# Patient Record
Sex: Female | Born: 1970
Health system: Southern US, Community
[De-identification: ages and names within clinical notes are randomized; demographics above are authoritative.]

## PROBLEM LIST (undated history)

## (undated) DIAGNOSIS — R87619 Unspecified abnormal cytological findings in specimens from cervix uteri: Secondary | ICD-10-CM

## (undated) DIAGNOSIS — F419 Anxiety disorder, unspecified: Secondary | ICD-10-CM

## (undated) DIAGNOSIS — IMO0002 Reserved for concepts with insufficient information to code with codable children: Secondary | ICD-10-CM

## (undated) DIAGNOSIS — F988 Other specified behavioral and emotional disorders with onset usually occurring in childhood and adolescence: Secondary | ICD-10-CM

## (undated) HISTORY — DX: Anxiety disorder, unspecified: F41.9

## (undated) HISTORY — DX: Unspecified abnormal cytological findings in specimens from cervix uteri: R87.619

## (undated) HISTORY — DX: Reserved for concepts with insufficient information to code with codable children: IMO0002

## (undated) HISTORY — DX: Other specified behavioral and emotional disorders with onset usually occurring in childhood and adolescence: F98.8

## (undated) HISTORY — PX: WISDOM TOOTH EXTRACTION: SHX21

---

## 2000-01-09 ENCOUNTER — Other Ambulatory Visit: Admission: RE | Admit: 2000-01-09 | Discharge: 2000-01-09 | Payer: Self-pay | Admitting: Obstetrics and Gynecology

## 2000-12-15 ENCOUNTER — Other Ambulatory Visit: Admission: RE | Admit: 2000-12-15 | Discharge: 2000-12-15 | Payer: Self-pay | Admitting: Obstetrics and Gynecology

## 2001-06-28 ENCOUNTER — Other Ambulatory Visit: Admission: RE | Admit: 2001-06-28 | Discharge: 2001-06-28 | Payer: Self-pay | Admitting: Obstetrics and Gynecology

## 2001-10-06 ENCOUNTER — Ambulatory Visit (HOSPITAL_COMMUNITY): Admission: RE | Admit: 2001-10-06 | Discharge: 2001-10-06 | Payer: Self-pay | Admitting: Obstetrics and Gynecology

## 2001-10-06 ENCOUNTER — Encounter: Payer: Self-pay | Admitting: Obstetrics and Gynecology

## 2001-11-06 ENCOUNTER — Emergency Department (HOSPITAL_COMMUNITY): Admission: EM | Admit: 2001-11-06 | Discharge: 2001-11-06 | Payer: Self-pay

## 2002-01-22 ENCOUNTER — Inpatient Hospital Stay (HOSPITAL_COMMUNITY): Admission: AD | Admit: 2002-01-22 | Discharge: 2002-01-25 | Payer: Self-pay | Admitting: Obstetrics and Gynecology

## 2002-01-22 ENCOUNTER — Inpatient Hospital Stay (HOSPITAL_COMMUNITY): Admission: AD | Admit: 2002-01-22 | Discharge: 2002-01-22 | Payer: Self-pay | Admitting: Obstetrics and Gynecology

## 2002-08-11 ENCOUNTER — Other Ambulatory Visit: Admission: RE | Admit: 2002-08-11 | Discharge: 2002-08-11 | Payer: Self-pay | Admitting: Obstetrics and Gynecology

## 2003-08-17 ENCOUNTER — Other Ambulatory Visit: Admission: RE | Admit: 2003-08-17 | Discharge: 2003-08-17 | Payer: Self-pay | Admitting: Obstetrics and Gynecology

## 2004-08-27 ENCOUNTER — Other Ambulatory Visit: Admission: RE | Admit: 2004-08-27 | Discharge: 2004-08-27 | Payer: Self-pay | Admitting: Obstetrics and Gynecology

## 2004-08-29 ENCOUNTER — Ambulatory Visit: Payer: Self-pay | Admitting: Family Medicine

## 2005-03-26 ENCOUNTER — Ambulatory Visit: Payer: Self-pay | Admitting: Family Medicine

## 2005-09-10 ENCOUNTER — Other Ambulatory Visit: Admission: RE | Admit: 2005-09-10 | Discharge: 2005-09-10 | Payer: Self-pay | Admitting: Obstetrics and Gynecology

## 2005-09-29 ENCOUNTER — Ambulatory Visit: Payer: Self-pay | Admitting: Family Medicine

## 2006-11-19 ENCOUNTER — Ambulatory Visit: Payer: Self-pay | Admitting: Family Medicine

## 2006-11-19 DIAGNOSIS — F411 Generalized anxiety disorder: Secondary | ICD-10-CM | POA: Insufficient documentation

## 2006-11-19 DIAGNOSIS — S82899A Other fracture of unspecified lower leg, initial encounter for closed fracture: Secondary | ICD-10-CM | POA: Insufficient documentation

## 2006-11-19 DIAGNOSIS — F988 Other specified behavioral and emotional disorders with onset usually occurring in childhood and adolescence: Secondary | ICD-10-CM

## 2006-11-19 DIAGNOSIS — M4120 Other idiopathic scoliosis, site unspecified: Secondary | ICD-10-CM

## 2006-12-10 ENCOUNTER — Telehealth: Payer: Self-pay | Admitting: Family Medicine

## 2006-12-11 ENCOUNTER — Telehealth (INDEPENDENT_AMBULATORY_CARE_PROVIDER_SITE_OTHER): Payer: Self-pay | Admitting: *Deleted

## 2006-12-25 ENCOUNTER — Telehealth: Payer: Self-pay | Admitting: Family Medicine

## 2006-12-30 ENCOUNTER — Ambulatory Visit: Payer: Self-pay | Admitting: Family Medicine

## 2006-12-30 DIAGNOSIS — J018 Other acute sinusitis: Secondary | ICD-10-CM | POA: Insufficient documentation

## 2007-01-07 ENCOUNTER — Telehealth: Payer: Self-pay | Admitting: Family Medicine

## 2007-02-09 ENCOUNTER — Telehealth (INDEPENDENT_AMBULATORY_CARE_PROVIDER_SITE_OTHER): Payer: Self-pay | Admitting: *Deleted

## 2007-03-10 ENCOUNTER — Telehealth: Payer: Self-pay | Admitting: Family Medicine

## 2007-04-14 ENCOUNTER — Telehealth (INDEPENDENT_AMBULATORY_CARE_PROVIDER_SITE_OTHER): Payer: Self-pay | Admitting: *Deleted

## 2007-05-18 ENCOUNTER — Telehealth (INDEPENDENT_AMBULATORY_CARE_PROVIDER_SITE_OTHER): Payer: Self-pay | Admitting: *Deleted

## 2007-06-14 ENCOUNTER — Telehealth (INDEPENDENT_AMBULATORY_CARE_PROVIDER_SITE_OTHER): Payer: Self-pay | Admitting: *Deleted

## 2007-06-23 ENCOUNTER — Ambulatory Visit: Payer: Self-pay | Admitting: Family Medicine

## 2007-06-23 ENCOUNTER — Telehealth (INDEPENDENT_AMBULATORY_CARE_PROVIDER_SITE_OTHER): Payer: Self-pay | Admitting: *Deleted

## 2007-06-23 DIAGNOSIS — L738 Other specified follicular disorders: Secondary | ICD-10-CM

## 2007-06-23 LAB — CONVERTED CEMR LAB
ALT: 16 units/L (ref 0–35)
AST: 19 units/L (ref 0–37)
Albumin: 4.1 g/dL (ref 3.5–5.2)
Alkaline Phosphatase: 50 units/L (ref 39–117)
Bilirubin, Direct: 0.1 mg/dL (ref 0.0–0.3)
Total Bilirubin: 0.7 mg/dL (ref 0.3–1.2)
Total Protein: 6.8 g/dL (ref 6.0–8.3)

## 2007-06-28 ENCOUNTER — Telehealth (INDEPENDENT_AMBULATORY_CARE_PROVIDER_SITE_OTHER): Payer: Self-pay | Admitting: *Deleted

## 2007-06-29 ENCOUNTER — Encounter (INDEPENDENT_AMBULATORY_CARE_PROVIDER_SITE_OTHER): Payer: Self-pay | Admitting: Family Medicine

## 2007-07-05 ENCOUNTER — Ambulatory Visit: Payer: Self-pay | Admitting: Family Medicine

## 2007-07-05 DIAGNOSIS — L42 Pityriasis rosea: Secondary | ICD-10-CM

## 2007-07-12 ENCOUNTER — Telehealth (INDEPENDENT_AMBULATORY_CARE_PROVIDER_SITE_OTHER): Payer: Self-pay | Admitting: *Deleted

## 2007-08-10 ENCOUNTER — Telehealth (INDEPENDENT_AMBULATORY_CARE_PROVIDER_SITE_OTHER): Payer: Self-pay | Admitting: *Deleted

## 2007-08-12 ENCOUNTER — Ambulatory Visit: Payer: Self-pay | Admitting: Family Medicine

## 2007-09-07 ENCOUNTER — Telehealth (INDEPENDENT_AMBULATORY_CARE_PROVIDER_SITE_OTHER): Payer: Self-pay | Admitting: *Deleted

## 2007-10-07 ENCOUNTER — Telehealth (INDEPENDENT_AMBULATORY_CARE_PROVIDER_SITE_OTHER): Payer: Self-pay | Admitting: *Deleted

## 2007-10-20 ENCOUNTER — Telehealth (INDEPENDENT_AMBULATORY_CARE_PROVIDER_SITE_OTHER): Payer: Self-pay | Admitting: *Deleted

## 2007-11-01 ENCOUNTER — Telehealth (INDEPENDENT_AMBULATORY_CARE_PROVIDER_SITE_OTHER): Payer: Self-pay | Admitting: *Deleted

## 2007-11-09 ENCOUNTER — Telehealth (INDEPENDENT_AMBULATORY_CARE_PROVIDER_SITE_OTHER): Payer: Self-pay | Admitting: *Deleted

## 2007-12-06 ENCOUNTER — Telehealth (INDEPENDENT_AMBULATORY_CARE_PROVIDER_SITE_OTHER): Payer: Self-pay | Admitting: *Deleted

## 2007-12-20 ENCOUNTER — Ambulatory Visit: Payer: Self-pay | Admitting: Family Medicine

## 2008-01-03 ENCOUNTER — Telehealth (INDEPENDENT_AMBULATORY_CARE_PROVIDER_SITE_OTHER): Payer: Self-pay | Admitting: *Deleted

## 2008-01-24 ENCOUNTER — Telehealth (INDEPENDENT_AMBULATORY_CARE_PROVIDER_SITE_OTHER): Payer: Self-pay | Admitting: *Deleted

## 2008-02-03 ENCOUNTER — Telehealth (INDEPENDENT_AMBULATORY_CARE_PROVIDER_SITE_OTHER): Payer: Self-pay | Admitting: *Deleted

## 2008-03-06 ENCOUNTER — Telehealth (INDEPENDENT_AMBULATORY_CARE_PROVIDER_SITE_OTHER): Payer: Self-pay | Admitting: *Deleted

## 2008-04-05 ENCOUNTER — Telehealth (INDEPENDENT_AMBULATORY_CARE_PROVIDER_SITE_OTHER): Payer: Self-pay | Admitting: *Deleted

## 2008-04-21 ENCOUNTER — Telehealth (INDEPENDENT_AMBULATORY_CARE_PROVIDER_SITE_OTHER): Payer: Self-pay | Admitting: *Deleted

## 2008-04-27 ENCOUNTER — Ambulatory Visit: Payer: Self-pay | Admitting: Family Medicine

## 2008-05-05 ENCOUNTER — Ambulatory Visit: Payer: Self-pay | Admitting: Family Medicine

## 2008-06-06 ENCOUNTER — Telehealth (INDEPENDENT_AMBULATORY_CARE_PROVIDER_SITE_OTHER): Payer: Self-pay | Admitting: *Deleted

## 2008-07-06 ENCOUNTER — Telehealth (INDEPENDENT_AMBULATORY_CARE_PROVIDER_SITE_OTHER): Payer: Self-pay | Admitting: *Deleted

## 2008-07-13 ENCOUNTER — Telehealth (INDEPENDENT_AMBULATORY_CARE_PROVIDER_SITE_OTHER): Payer: Self-pay | Admitting: *Deleted

## 2008-07-31 ENCOUNTER — Telehealth: Payer: Self-pay | Admitting: Family Medicine

## 2008-08-08 ENCOUNTER — Telehealth (INDEPENDENT_AMBULATORY_CARE_PROVIDER_SITE_OTHER): Payer: Self-pay | Admitting: *Deleted

## 2008-09-04 ENCOUNTER — Telehealth (INDEPENDENT_AMBULATORY_CARE_PROVIDER_SITE_OTHER): Payer: Self-pay | Admitting: *Deleted

## 2008-10-12 ENCOUNTER — Telehealth (INDEPENDENT_AMBULATORY_CARE_PROVIDER_SITE_OTHER): Payer: Self-pay | Admitting: *Deleted

## 2008-10-24 ENCOUNTER — Ambulatory Visit: Payer: Self-pay | Admitting: Family Medicine

## 2008-10-24 ENCOUNTER — Telehealth (INDEPENDENT_AMBULATORY_CARE_PROVIDER_SITE_OTHER): Payer: Self-pay | Admitting: *Deleted

## 2008-10-24 DIAGNOSIS — F17200 Nicotine dependence, unspecified, uncomplicated: Secondary | ICD-10-CM

## 2009-01-15 ENCOUNTER — Telehealth (INDEPENDENT_AMBULATORY_CARE_PROVIDER_SITE_OTHER): Payer: Self-pay | Admitting: *Deleted

## 2009-02-20 ENCOUNTER — Telehealth (INDEPENDENT_AMBULATORY_CARE_PROVIDER_SITE_OTHER): Payer: Self-pay | Admitting: *Deleted

## 2009-03-27 ENCOUNTER — Telehealth: Payer: Self-pay | Admitting: Family Medicine

## 2009-04-25 ENCOUNTER — Ambulatory Visit: Payer: Self-pay | Admitting: Family Medicine

## 2009-04-25 DIAGNOSIS — S92919A Unspecified fracture of unspecified toe(s), initial encounter for closed fracture: Secondary | ICD-10-CM

## 2009-05-22 ENCOUNTER — Telehealth: Payer: Self-pay | Admitting: Family Medicine

## 2009-06-21 ENCOUNTER — Telehealth: Payer: Self-pay | Admitting: Family Medicine

## 2009-07-23 ENCOUNTER — Telehealth: Payer: Self-pay | Admitting: Family Medicine

## 2009-08-15 ENCOUNTER — Telehealth: Payer: Self-pay | Admitting: Family Medicine

## 2009-08-28 ENCOUNTER — Telehealth: Payer: Self-pay | Admitting: Family Medicine

## 2009-09-17 ENCOUNTER — Telehealth: Payer: Self-pay | Admitting: Family Medicine

## 2009-10-15 ENCOUNTER — Telehealth: Payer: Self-pay | Admitting: Family Medicine

## 2009-10-18 ENCOUNTER — Ambulatory Visit: Payer: Self-pay | Admitting: Family Medicine

## 2009-10-18 DIAGNOSIS — M25569 Pain in unspecified knee: Secondary | ICD-10-CM

## 2009-11-01 ENCOUNTER — Encounter: Payer: Self-pay | Admitting: Family Medicine

## 2009-11-13 ENCOUNTER — Telehealth: Payer: Self-pay | Admitting: Family Medicine

## 2009-12-13 ENCOUNTER — Telehealth: Payer: Self-pay | Admitting: Family Medicine

## 2010-01-14 ENCOUNTER — Ambulatory Visit: Payer: Self-pay | Admitting: Family Medicine

## 2010-03-13 ENCOUNTER — Telehealth (INDEPENDENT_AMBULATORY_CARE_PROVIDER_SITE_OTHER): Payer: Self-pay | Admitting: *Deleted

## 2010-04-11 ENCOUNTER — Telehealth: Payer: Self-pay | Admitting: Family Medicine

## 2010-04-23 ENCOUNTER — Telehealth: Payer: Self-pay | Admitting: Family Medicine

## 2010-05-20 ENCOUNTER — Telehealth: Payer: Self-pay | Admitting: Family Medicine

## 2010-06-18 ENCOUNTER — Telehealth: Payer: Self-pay | Admitting: Family Medicine

## 2010-07-16 NOTE — Assessment & Plan Note (Signed)
Summary: knee popped out /cbs   Vital Signs:  Patient profile:   40 year old female Height:      60 inches Weight:      117.25 pounds BMI:     22.98 Pulse rate:   80 / minute Pulse rhythm:   regular BP sitting:   110 / 70  (left arm) Cuff size:   regular  Vitals Entered By: Army Fossa CMA (Oct 18, 2009 10:43 AM) CC: Pt states her knee popped out last Thursday, was not seen by anyone. Doing better but still hurting.   History of Present Illness:  Injury      This is a 40 year old woman who presents with An injury.  The symptoms began 1 week ago.  Pt here c/o R knee popping out of joint last Thursday when she turned funny while standing at mirror.  She was able to pop it back it place.  The patient reports injury to the right knee.  The patient also reports tenderness.  The patient denies swelling, redness, increased warmth deformity, blood loss, numbness, weakness, loss of sensation, coolness of extremity, and loss of consciousness.  The patient denies the following risk factors for significant bleeding: aspirin use, anticoagulant use, and history of bleeding disorder.  Screening for risk of abuse was negative.  Pt was states that about 40 yrs ago she was told she tore ligaments in that knee when she fell off top of pyramid in cheerleading.    Allergies: 1)  ! Cipro 2)  * Chantix  Physical Exam  General:  Well-developed,well-nourished,in no acute distress; alert,appropriate and cooperative throughout examination Msk:  Slight pain with palpation of Medial Right knee no joint swelling, no joint warmth, no redness over joints, no joint deformities, and no crepitation.   Neurologic:  alert & oriented X3, strength normal in all extremities, and gait normal.   Psych:  Oriented X3 and normally interactive.     Impression & Recommendations:  Problem # 1:  KNEE PAIN, RIGHT (ICD-719.46)  Her updated medication list for this problem includes:    Norco 10-325 Mg Tabs  (Hydrocodone-acetaminophen) .Marland Kitchen... 1 by mouth every 6 hours as needed  Orders: Knee Orthosis Elastic Knee Cap (Z6109) Orthopedic Surgeon Referral (Ortho Surgeon)  Discussed strengthening exercises, use of ice or heat, and medications.   Complete Medication List: 1)  Alprazolam 0.5 Mg Tabs (Alprazolam) .... Take one tablet daily 2)  Norco 10-325 Mg Tabs (Hydrocodone-acetaminophen) .Marland Kitchen.. 1 by mouth every 6 hours as needed 3)  Adderall 30 Mg Tabs (Amphetamine-dextroamphetamine) .Marland Kitchen.. 1 two times a day 4)  Mirena Iud (Levonorgestrel iud) 5)  Veramyst 27.5 Mcg/spray Susp (Fluticasone furoate) .... 2 sprays each nostril once daily 6)  Wellbutrin Xl 150 Mg Xr24h-tab (Bupropion hcl) .Marland Kitchen.. 1 by mouth once daily

## 2010-07-16 NOTE — Progress Notes (Signed)
Summary: RX  Phone Note Call from Patient Call back at Work Phone [phone]   Caller: Patient Reason for Call: Refill Medication Summary of Call: ADDERALL 30 MG AND HYDROCODONE-10-325 MG PLEASE CALL WHEN READY Initial call taken by: Freddy Jaksch,  August 15, 2009 3:59 PM  Follow-up for Phone Call        last filled 07/23/09, last ov- 04/25/09. Army Fossa CMA  August 15, 2009 4:09 PM   Additional Follow-up for Phone Call Additional follow up Details #1::        ok to refill both x1 Additional Follow-up by: Loreen Freud DO,  August 15, 2009 9:04 PM    Prescriptions: NORCO 10-325 MG TABS (HYDROCODONE-ACETAMINOPHEN) 1 by mouth every 6 hours as needed  #30 x 0   Entered by:   Army Fossa CMA   Authorized by:   Loreen Freud DO   Signed by:   Army Fossa CMA on 08/16/2009   Method used:   Print then Give to Patient   RxID:   [id number] ADDERALL 30 MG TABS (AMPHETAMINE-DEXTROAMPHETAMINE) 1 two times a day  #60 x 0   Entered by:   Army Fossa CMA   Authorized by:   Loreen Freud DO   Signed by:   Army Fossa CMA on 08/16/2009   Method used:   Print then Give to Patient   RxID:   [id number]

## 2010-07-16 NOTE — Progress Notes (Signed)
Summary: med unavailable  Phone Note Call from Patient Call back at Work Phone [phone]   Caller: Patient Summary of Call: Pt states that she is unable to get adderall at any pharmacy but they do have the extended release available. Pt is requesting that rx be rewritten for extended release. Pls advise........................Marland KitchenFelecia Deloach CMA  April 23, 2010 8:37 AM   Follow-up for Phone Call        The pharmacys are running out of both---I recommend she switch to ritalin 20 two times a day --if she has never tried it before. Follow-up by: Loreen Freud DO,  April 23, 2010 9:34 AM    New/Updated Medications: RITALIN 20 MG TABS (METHYLPHENIDATE HCL) 1 by mouth two times a day Prescriptions: RITALIN 20 MG TABS (METHYLPHENIDATE HCL) 1 by mouth two times a day  #60 x 0   Entered and Authorized by:   Loreen Freud DO   Signed by:   Loreen Freud DO on 04/23/2010   Method used:   Print then Give to Patient   RxID:   [id number]

## 2010-07-16 NOTE — Progress Notes (Signed)
Summary: refill  Phone Note Refill Request Call back at Work Phone [phone] Message from:  Patient  Refills Requested: Medication #1:  ADDERALL 30 MG TABS 1 two times a day  Medication #2:  ALPRAZOLAM 0.5 MG TABS TAKE ONE TABLET DAILY  Medication #3:  NORCO 10-325 MG TABS 1 by mouth every 6 hours as needed pt aware rx will be ready  by 2 pm tomorrow. Alprazolam last filled 03-13-10 #30 2, norco 03-13-10 #30  Initial call taken by: Jeremy Johann CMA,  April 11, 2010 2:58 PM  Follow-up for Phone Call        refill all x1    Prescriptions: ALPRAZOLAM 0.5 MG TABS (ALPRAZOLAM) TAKE ONE TABLET DAILY  #30 x 0   Entered by:   Almeta Monas CMA (AAMA)   Authorized by:   Loreen Freud DO   Signed by:   Almeta Monas CMA (AAMA) on 04/11/2010   Method used:   Print then Give to Patient   RxID:   [id number] ADDERALL 30 MG TABS (AMPHETAMINE-DEXTROAMPHETAMINE) 1 two times a day  #60 x 0   Entered by:   Almeta Monas CMA (AAMA)   Authorized by:   Loreen Freud DO   Signed by:   Almeta Monas CMA (AAMA) on 04/11/2010   Method used:   Print then Give to Patient   RxID:   [id number] NORCO 10-325 MG TABS (HYDROCODONE-ACETAMINOPHEN) 1 by mouth every 6 hours as needed  #30 x 0   Entered by:   Almeta Monas CMA (AAMA)   Authorized by:   Loreen Freud DO   Signed by:   Almeta Monas CMA (AAMA) on 04/11/2010   Method used:   Print then Give to Patient   RxID:   [id number]

## 2010-07-16 NOTE — Progress Notes (Signed)
Summary: refill  Phone Note Refill Request Call back at Work Phone [phone]  on Oct 15, 2009 12:29 PM  Refills Requested: Medication #1:  ADDERALL 30 MG TABS 1 two times a day   Last Refilled: 09/17/2009  Medication #2:  hydrocodone 10 mg as neededi   Last Refilled: 09/17/2009 call patient when ready   Method Requested: Pick up at Office Next Appointment Scheduled: [id number] Initial call taken by: Okey Regal Spring,  Oct 15, 2009 12:30 PM  Follow-up for Phone Call        last ov- 04/2009. Army Fossa CMA  Oct 15, 2009 12:46 PM   Additional Follow-up for Phone Call Additional follow up Details #1::        ok to refill both x1 Additional Follow-up by: Loreen Freud DO,  Oct 15, 2009 1:02 PM    Additional Follow-up for Phone Call Additional follow up Details #2::    Pt aware rx is ready. Army Fossa CMA  Oct 15, 2009 1:20 PM   Prescriptions: ADDERALL 30 MG TABS (AMPHETAMINE-DEXTROAMPHETAMINE) 1 two times a day  #60 x 0   Entered by:   Army Fossa CMA   Authorized by:   Loreen Freud DO   Signed by:   Army Fossa CMA on 10/15/2009   Method used:   Printed then faxed to ...       [facility]  [address] [phone]* (retail)       [address]       [place], [place]  [address]       Ph: [phone]       Fax: [phone]   RxID:   [id number] NORCO 10-325 MG TABS (HYDROCODONE-ACETAMINOPHEN) 1 by mouth every 6 hours as needed  #30 x 0   Entered by:   Army Fossa CMA   Authorized by:   Loreen Freud DO   Signed by:   Army Fossa CMA on 10/15/2009   Method used:   Printed then faxed to ...       [facility]  [address] [phone]* (retail)       [address]       [place], [place]  [address]       Ph: [phone]       Fax: [phone]   RxID:   [id number]

## 2010-07-16 NOTE — Progress Notes (Signed)
Summary: REFILL  Phone Note Call from Patient Call back at Work Phone [phone]   Caller: Patient Summary of Call: PATIENT IS REQUESTING A REFILL ON ADDERRAL 30MG . PATIENT IS REQUESTING A REFILL HYDROCODONE. Initial call taken by: Barb Merino,  June 21, 2009 12:17 PM  Follow-up for Phone Call        hydrocodone- last filled 05/22/09 Follow-up by: Army Fossa CMA,  June 21, 2009 2:47 PM  Additional Follow-up for Phone Call Additional follow up Details #1::        ok to refill x 1 each Additional Follow-up by: Loreen Freud DO,  June 21, 2009 3:26 PM    Additional Follow-up for Phone Call Additional follow up Details #2::    pt is aware meds are ready for her to pick up.  Follow-up by: Army Fossa CMA,  June 21, 2009 4:18 PM  Prescriptions: ADDERALL 30 MG TABS (AMPHETAMINE-DEXTROAMPHETAMINE) 1 two times a day  #60 x 0   Entered by:   Army Fossa CMA   Authorized by:   Loreen Freud DO   Signed by:   Army Fossa CMA on 06/21/2009   Method used:   Print then Give to Patient   RxID:   [id number] NORCO 10-325 MG TABS (HYDROCODONE-ACETAMINOPHEN) 1 by mouth every 6 hours as needed  #30 x 0   Entered by:   Army Fossa CMA   Authorized by:   Loreen Freud DO   Signed by:   Army Fossa CMA on 06/21/2009   Method used:   Print then Give to Patient   RxID:   [id number]

## 2010-07-16 NOTE — Progress Notes (Signed)
Summary: refill  Phone Note Refill Request Call back at Work Phone [phone]   Refills Requested: Medication #1:  ADDERALL 30 MG TABS 1 two times a day   Last Refilled: 10/15/2009  Medication #2:  NORCO 10-325 MG TABS 1 by mouth every 6 hours as needed   Last Refilled: 10/15/2009 call patient when ready   Method Requested: Pick up at Office Initial call taken by: Okey Regal Spring,  Nov 13, 2009 8:40 AM  Follow-up for Phone Call        last ov- 10/18/09 knee popped out, last ov for med refill 04/2009.  Follow-up by: Army Fossa CMA,  Nov 13, 2009 9:40 AM  Additional Follow-up for Phone Call Additional follow up Details #1::        ok to refill x1 both Additional Follow-up by: Loreen Freud DO,  Nov 13, 2009 9:49 AM    Additional Follow-up for Phone Call Additional follow up Details #2::    Pt is aware, rx is ready. Army Fossa CMA  Nov 13, 2009 9:52 AM   Prescriptions: ADDERALL 30 MG TABS (AMPHETAMINE-DEXTROAMPHETAMINE) 1 two times a day  #60 x 0   Entered by:   Army Fossa CMA   Authorized by:   Loreen Freud DO   Signed by:   Army Fossa CMA on 11/13/2009   Method used:   Print then Give to Patient   RxID:   [id number] NORCO 10-325 MG TABS (HYDROCODONE-ACETAMINOPHEN) 1 by mouth every 6 hours as needed  #30 x 0   Entered by:   Army Fossa CMA   Authorized by:   Loreen Freud DO   Signed by:   Army Fossa CMA on 11/13/2009   Method used:   Print then Give to Patient   RxID:   [id number]

## 2010-07-16 NOTE — Assessment & Plan Note (Signed)
Summary: med refill/cbs   Vital Signs:  Patient profile:   40 year old female Height:      60 inches (152.40 cm) Weight:      115.38 pounds (52.45 kg) BMI:     22.62 Temp:     98.6 degrees F (37.00 degrees C) oral BP sitting:   110 / 68  (right arm) Cuff size:   regular  Vitals Entered By: Lucious Groves CMA (January 14, 2010 8:16 AM) CC: Med refill./kb Is Patient Diabetic? No Pain Assessment Patient in pain? no        History of Present Illness: Pt here for f/u anxiety/depression and ADD. Pt doing well with meds.  No complaints.    Current Medications (verified): 1)  Alprazolam 0.5 Mg Tabs (Alprazolam) .... Take One Tablet Daily 2)  Norco 10-325 Mg Tabs (Hydrocodone-Acetaminophen) .Marland Kitchen.. 1 By Mouth Every 6 Hours As Needed 3)  Adderall 30 Mg Tabs (Amphetamine-Dextroamphetamine) .Marland Kitchen.. 1 Two Times A Day 4)  Mirena  Iud (Levonorgestrel Iud) 5)  Veramyst 27.5 Mcg/spray  Susp (Fluticasone Furoate) .... 2 Sprays Each Nostril Once Daily 6)  Wellbutrin Xl 150 Mg Xr24h-Tab (Bupropion Hcl) .Marland Kitchen.. 1 By Mouth Once Daily  Allergies (verified): 1)  ! Cipro 2)  * Chantix  Physical Exam  General:  Well-developed,well-nourished,in no acute distress; alert,appropriate and cooperative throughout examination Lungs:  Normal respiratory effort, chest expands symmetrically. Lungs are clear to auscultation, no crackles or wheezes. Heart:  normal rate and no murmur.   Extremities:  No clubbing, cyanosis, edema, or deformity noted with normal full range of motion of all joints.   Psych:  Oriented X3, normally interactive, good eye contact, not anxious appearing, and not depressed appearing.     Impression & Recommendations:  Problem # 1:  ADD (ICD-314.00) refills adderall  Problem # 2:  ANXIETY (ICD-300.00)  Her updated medication list for this problem includes:    Alprazolam 0.5 Mg Tabs (Alprazolam) .Marland Kitchen... Take one tablet daily    Wellbutrin Xl 300 Mg Xr24h-tab (Bupropion hcl) .Marland Kitchen... 1 by mouth  qam  Discussed medication use and relaxation techniques.   Problem # 3:  SCOLIOSIS, HX OF (ICD-V13.5) norco for occassional pain pt is not abusing med  Complete Medication List: 1)  Alprazolam 0.5 Mg Tabs (Alprazolam) .... Take one tablet daily 2)  Norco 10-325 Mg Tabs (Hydrocodone-acetaminophen) .Marland Kitchen.. 1 by mouth every 6 hours as needed 3)  Adderall 30 Mg Tabs (Amphetamine-dextroamphetamine) .Marland Kitchen.. 1 two times a day 4)  Mirena Iud (Levonorgestrel iud) 5)  Veramyst 27.5 Mcg/spray Susp (Fluticasone furoate) .... 2 sprays each nostril once daily 6)  Wellbutrin Xl 300 Mg Xr24h-tab (Bupropion hcl) .Marland Kitchen.. 1 by mouth qam  Patient Instructions: 1)  Please schedule a follow-up appointment in 6 months .  Prescriptions: WELLBUTRIN XL 300 MG XR24H-TAB (BUPROPION HCL) 1 by mouth qam  #30 x 11   Entered and Authorized by:   Loreen Freud DO   Signed by:   Loreen Freud DO on 01/14/2010   Method used:   Electronically to        [facility] [phone]* (retail)       [address]       [place], [place]  [address]       Ph: [phone]       Fax: [phone]   RxID:   [id number] ADDERALL 30 MG TABS (AMPHETAMINE-DEXTROAMPHETAMINE) 1 two times a day  #60 x 0   Entered and Authorized by:   Myrene Buddy  Lowne DO   Signed by:   Loreen Freud DO on 01/14/2010   Method used:   Print then Give to Patient   RxID:   [id number] NORCO 10-325 MG TABS (HYDROCODONE-ACETAMINOPHEN) 1 by mouth every 6 hours as needed  #30 x 0   Entered and Authorized by:   Loreen Freud DO   Signed by:   Loreen Freud DO on 01/14/2010   Method used:   Print then Give to Patient   RxID:   [id number] ALPRAZOLAM 0.5 MG TABS (ALPRAZOLAM) TAKE ONE TABLET DAILY  #30 x 2   Entered and Authorized by:   Loreen Freud DO   Signed by:   Loreen Freud DO on 01/14/2010   Method used:   Print then Give to Patient   RxID:   [id number]

## 2010-07-16 NOTE — Progress Notes (Signed)
Summary: adderall and norco refill   Phone Note Refill Request Message from:  Patient on March 13, 2010 10:50 AM  Refills Requested: Medication #1:  NORCO 10-325 MG TABS 1 by mouth every 6 hours as needed  Medication #2:  ADDERALL 30 MG TABS 1 two times a day Initial call taken by: Doristine Devoid CMA,  March 13, 2010 10:50 AM  Follow-up for Phone Call        informed patient prescription ready to pick up.......Marland KitchenDoristine Devoid CMA  March 13, 2010 10:51 AM     Prescriptions: ADDERALL 30 MG TABS (AMPHETAMINE-DEXTROAMPHETAMINE) 1 two times a day  #60 x 0   Entered by:   Doristine Devoid CMA   Authorized by:   Loreen Freud DO   Signed by:   Doristine Devoid CMA on 03/13/2010   Method used:   Print then Give to Patient   RxID:   [id number] NORCO 10-325 MG TABS (HYDROCODONE-ACETAMINOPHEN) 1 by mouth every 6 hours as needed  #30 x 0   Entered by:   Doristine Devoid CMA   Authorized by:   Loreen Freud DO   Signed by:   Doristine Devoid CMA on 03/13/2010   Method used:   Print then Give to Patient   RxID:   [id number]

## 2010-07-16 NOTE — Progress Notes (Signed)
Summary: Refill  Phone Note Refill Request   Refills Requested: Medication #1:  ALPRAZOLAM 0.5 MG TABS TAKE ONE TABLET DAILY   Last Refilled: 07/30/2009 last ov-04/25/2009. Army Fossa CMA  August 28, 2009 3:34 PM    Follow-up for Phone Call        ok to refill x1 Follow-up by: Loreen Freud DO,  August 28, 2009 3:35 PM    Prescriptions: ALPRAZOLAM 0.5 MG TABS (ALPRAZOLAM) TAKE ONE TABLET DAILY  #30 x 0   Entered by:   Army Fossa CMA   Authorized by:   Loreen Freud DO   Signed by:   Army Fossa CMA on 08/28/2009   Method used:   Printed then faxed to ...       [facility]  [address] [phone]* (retail)       [address]       [place], [place]  [address]       Ph: [phone]       Fax: [phone]   RxID:   [id number]

## 2010-07-16 NOTE — Progress Notes (Signed)
Summary: refills  Phone Note Refill Request Call back at Work Phone [phone]   Refills Requested: Medication #1:  RITALIN 20 MG TABS 1 by mouth two times a day  Medication #2:  ALPRAZOLAM 0.5 MG TABS TAKE ONE TABLET DAILY  Medication #3:  NORCO 10-325 MG TABS 1 by mouth every 6 hours as needed Pt will pick all med up on tomorrow afternoon, Last OV 01-14-10, last filled 04-11-10 #30 alprazzolam, norco  Initial call taken by: Jeremy Johann CMA,  May 20, 2010 4:43 PM  Follow-up for Phone Call        ok to refill each x1 Follow-up by: Loreen Freud DO,  May 20, 2010 8:27 PM    Prescriptions: RITALIN 20 MG TABS (METHYLPHENIDATE HCL) 1 by mouth two times a day  #60 x 0   Entered by:   Almeta Monas CMA (AAMA)   Authorized by:   Loreen Freud DO   Signed by:   Almeta Monas CMA (AAMA) on 05/21/2010   Method used:   Print then Give to Patient   RxID:   [id number] NORCO 10-325 MG TABS (HYDROCODONE-ACETAMINOPHEN) 1 by mouth every 6 hours as needed  #30 x 0   Entered by:   Almeta Monas CMA (AAMA)   Authorized by:   Loreen Freud DO   Signed by:   Almeta Monas CMA (AAMA) on 05/21/2010   Method used:   Print then Give to Patient   RxID:   [id number] ALPRAZOLAM 0.5 MG TABS (ALPRAZOLAM) TAKE ONE TABLET DAILY  #30 x 0   Entered by:   Almeta Monas CMA (AAMA)   Authorized by:   Loreen Freud DO   Signed by:   Almeta Monas CMA (AAMA) on 05/21/2010   Method used:   Print then Give to Patient   RxID:   [id number]

## 2010-07-16 NOTE — Progress Notes (Signed)
Summary: refills  Phone Note Refill Request   Refills Requested: Medication #1:  ADDERALL 30 MG TABS 1 two times a day  Medication #2:  NORCO 10-325 MG TABS 1 by mouth every 6 hours as needed norco-last filled 11-13-09 #30, last OV11-10-10..............Marland KitchenFelecia Deloach CMA  December 13, 2009 11:14 AM    Follow-up for Phone Call        ok for #30 of each.  no refills. Follow-up by: Neena Rhymes MD,  December 13, 2009 11:33 AM  Additional Follow-up for Phone Call Additional follow up Details #1::        pt aware rx ready for pick-up and to schedule  6 month OV when med picked up.......Marland KitchenFelecia Deloach CMA  December 13, 2009 11:58 AM     Prescriptions: ADDERALL 30 MG TABS (AMPHETAMINE-DEXTROAMPHETAMINE) 1 two times a day  #60 x 0   Entered by:   Jeremy Johann CMA   Authorized by:   Neena Rhymes MD   Signed by:   Jeremy Johann CMA on 12/13/2009   Method used:   Print then Give to Patient   RxID:   [id number] NORCO 10-325 MG TABS (HYDROCODONE-ACETAMINOPHEN) 1 by mouth every 6 hours as needed  #30 x 0   Entered by:   Jeremy Johann CMA   Authorized by:   Neena Rhymes MD   Signed by:   Jeremy Johann CMA on 12/13/2009   Method used:   Print then Give to Patient   RxID:   [id number]

## 2010-07-16 NOTE — Progress Notes (Signed)
Summary: Refills  Phone Note Refill Request Message from:  Patient  Refills Requested: Medication #1:  NORCO 10-325 MG TABS 1 by mouth every 6 hours as needed   Last Refilled: 08/16/2009  Medication #2:  ADDERALL 30 MG TABS 1 two times a day   Last Refilled: 08/16/2009 lst ov- 04/2009. Army Fossa CMA  September 17, 2009 9:49 AM    Follow-up for Phone Call        ok to refill both x1  Follow-up by: Loreen Freud DO,  September 17, 2009 10:32 AM  Additional Follow-up for Phone Call Additional follow up Details #1::        Pt is aware rx is ready. Army Fossa CMA  September 17, 2009 11:13 AM     Prescriptions: ADDERALL 30 MG TABS (AMPHETAMINE-DEXTROAMPHETAMINE) 1 two times a day  #60 x 0   Entered and Authorized by:   Loreen Freud DO   Signed by:   Loreen Freud DO on 09/17/2009   Method used:   Print then Give to Patient   RxID:   [id number] NORCO 10-325 MG TABS (HYDROCODONE-ACETAMINOPHEN) 1 by mouth every 6 hours as needed  #30 x 0   Entered and Authorized by:   Loreen Freud DO   Signed by:   Loreen Freud DO on 09/17/2009   Method used:   Print then Give to Patient   RxID:   [id number]

## 2010-07-16 NOTE — Progress Notes (Signed)
Summary: refills   Phone Note Refill Request   Refills Requested: Medication #1:  ADDERALL 30 MG TABS 1 two times a day  Medication #2:  NORCO 10-325 MG TABS 1 by mouth every 6 hours as needed Last ov- 04/25/09, last filled 06/21/09 Army Fossa CMA  July 23, 2009 9:23 AM    Follow-up for Phone Call        ok to refill both x1 Follow-up by: Loreen Freud DO,  July 23, 2009 9:35 AM  Additional Follow-up for Phone Call Additional follow up Details #1::        pt aware rx's are ready. Army Fossa CMA  July 23, 2009 1:52 PM     Prescriptions: NORCO 10-325 MG TABS (HYDROCODONE-ACETAMINOPHEN) 1 by mouth every 6 hours as needed  #30 x 0   Entered by:   Army Fossa CMA   Authorized by:   Loreen Freud DO   Signed by:   Army Fossa CMA on 07/23/2009   Method used:   Print then Give to Patient   RxID:   [id number] ADDERALL 30 MG TABS (AMPHETAMINE-DEXTROAMPHETAMINE) 1 two times a day  #60 x 0   Entered by:   Army Fossa CMA   Authorized by:   Loreen Freud DO   Signed by:   Army Fossa CMA on 07/23/2009   Method used:   Print then Give to Patient   RxID:   [id number]

## 2010-07-16 NOTE — Consult Note (Signed)
Summary: [facility]  [facility] [place]   Imported By: Lanelle Bal 11/21/2009 11:41:45  _____________________________________________________________________  External Attachment:    Type:   Image     Comment:   External Document

## 2010-07-17 ENCOUNTER — Encounter: Payer: Self-pay | Admitting: Family Medicine

## 2010-07-18 ENCOUNTER — Encounter: Payer: Self-pay | Admitting: Family Medicine

## 2010-07-18 ENCOUNTER — Ambulatory Visit: Admit: 2010-07-18 | Payer: Self-pay | Admitting: Family Medicine

## 2010-07-18 ENCOUNTER — Ambulatory Visit (INDEPENDENT_AMBULATORY_CARE_PROVIDER_SITE_OTHER): Payer: BC Managed Care – PPO | Admitting: Family Medicine

## 2010-07-18 DIAGNOSIS — F988 Other specified behavioral and emotional disorders with onset usually occurring in childhood and adolescence: Secondary | ICD-10-CM

## 2010-07-18 DIAGNOSIS — Z8739 Personal history of other diseases of the musculoskeletal system and connective tissue: Secondary | ICD-10-CM

## 2010-07-18 DIAGNOSIS — F411 Generalized anxiety disorder: Secondary | ICD-10-CM

## 2010-07-18 NOTE — Progress Notes (Signed)
Summary: refill  Phone Note Refill Request Call back at Work Phone [phone]   Refills Requested: Medication #1:  ALPRAZOLAM 0.5 MG TABS TAKE ONE TABLET DAILY  Medication #2:  RITALIN 20 MG TABS 1 by mouth two times a day  Medication #3:  NORCO 10-325 MG TABS 1 by mouth every 6 hours as needed Pt to pick up on tomorrow...........Marland KitchenFelecia Deloach CMA  June 18, 2010 11:01 AM    Follow-up for Phone Call        alprazolam and norco last filled 05/21/10 pt last seen 01/19/10.Marland KitchenMarland Kitchenplease advise Follow-up by: Almeta Monas CMA Duncan Dull),  June 18, 2010 2:39 PM  Additional Follow-up for Phone Call Additional follow up Details #1::        ok to refill x1 each-- pt due ov next month Additional Follow-up by: Loreen Freud DO,  June 18, 2010 2:40 PM    Prescriptions: RITALIN 20 MG TABS (METHYLPHENIDATE HCL) 1 by mouth two times a day  #60 x 0   Entered by:   Almeta Monas CMA (AAMA)   Authorized by:   Loreen Freud DO   Signed by:   Almeta Monas CMA (AAMA) on 06/18/2010   Method used:   Print then Give to Patient   RxID:   [id number] NORCO 10-325 MG TABS (HYDROCODONE-ACETAMINOPHEN) 1 by mouth every 6 hours as needed  #30 x 0   Entered by:   Almeta Monas CMA (AAMA)   Authorized by:   Loreen Freud DO   Signed by:   Almeta Monas CMA (AAMA) on 06/18/2010   Method used:   Print then Give to Patient   RxID:   [id number] ALPRAZOLAM 0.5 MG TABS (ALPRAZOLAM) TAKE ONE TABLET DAILY  #30 x 0   Entered by:   Almeta Monas CMA (AAMA)   Authorized by:   Loreen Freud DO   Signed by:   Almeta Monas CMA (AAMA) on 06/18/2010   Method used:   Print then Give to Patient   RxID:   [id number]

## 2010-07-24 NOTE — Assessment & Plan Note (Signed)
Summary: Med check   Vital Signs:  Patient profile:   40 year old female Weight:      124.4 pounds Pulse rate:   80 / minute Pulse rhythm:   regular BP sitting:   128 / 72  (left arm) Cuff size:   regular  Vitals Entered By: Almeta Monas CMA Duncan Dull) (July 18, 2010 9:54 AM)  Serial Vital Signs/Assessments:  Time      Position  BP       Pulse  Resp  Temp     By                     98/60                          Loreen Freud DO  CC: Med check--wants to switch back to Adderall   History of Present Illness: Pt here f/u ADD ---she would like to switch back to adderall.    Current Medications (verified): 1)  Alprazolam 0.5 Mg Tabs (Alprazolam) .... Take One Tablet Daily 2)  Norco 10-325 Mg Tabs (Hydrocodone-Acetaminophen) .Marland Kitchen.. 1 By Mouth Every 6 Hours As Needed 3)  Adderall 30 Mg Tabs (Amphetamine-Dextroamphetamine) .Marland Kitchen.. 1 By Mouth Two Times A Day 4)  Mirena  Iud (Levonorgestrel Iud) 5)  Veramyst 27.5 Mcg/spray  Susp (Fluticasone Furoate) .... 2 Sprays Each Nostril Once Daily 6)  Wellbutrin Xl 300 Mg Xr24h-Tab (Bupropion Hcl) .Marland Kitchen.. 1 By Mouth Qam  Allergies (verified): 1)  ! Cipro 2)  * Chantix  Past History:  Past Medical History: Last updated: 07/05/2007 Anxiety ADD  Family History: Last updated: 11/19/2006 Family History Hypertension pGM-  MI 40yo Puncle- 50 MI  Social History: Last updated: 11/19/2006 Current Smoker  Risk Factors: Smoking Status: current (04/25/2009) Packs/Day: 0.25 (04/25/2009)  Family History: Reviewed history from 11/19/2006 and no changes required. Family History Hypertension pGM-  MI 40yo Puncle- 50 MI  Review of Systems      See HPI  Physical Exam  General:  Well-developed,well-nourished,in no acute distress; alert,appropriate and cooperative throughout examination Lungs:  Normal respiratory effort, chest expands symmetrically. Lungs are clear to auscultation, no crackles or wheezes. Heart:  normal rate and no murmur.     Extremities:  No clubbing, cyanosis, edema, or deformity noted with normal full range of motion of all joints.   Psych:  Oriented X3 and normally interactive.     Impression & Recommendations:  Problem # 1:  ADD (ICD-314.00) switch back to adderall  Problem # 2:  SCOLIOSIS, HX OF (ICD-V13.5)  refill pain meds  Problem # 3:  ANXIETY (ICD-300.00)  Her updated medication list for this problem includes:    Alprazolam 0.5 Mg Tabs (Alprazolam) .Marland Kitchen... Take one tablet daily    Wellbutrin Xl 300 Mg Xr24h-tab (Bupropion hcl) .Marland Kitchen... 1 by mouth qam  Discussed medication use and relaxation techniques.   Complete Medication List: 1)  Alprazolam 0.5 Mg Tabs (Alprazolam) .... Take one tablet daily 2)  Norco 10-325 Mg Tabs (Hydrocodone-acetaminophen) .Marland Kitchen.. 1 by mouth every 6 hours as needed 3)  Adderall 30 Mg Tabs (Amphetamine-dextroamphetamine) .Marland Kitchen.. 1 by mouth two times a day 4)  Mirena Iud (Levonorgestrel iud) 5)  Veramyst 27.5 Mcg/spray Susp (Fluticasone furoate) .... 2 sprays each nostril once daily 6)  Wellbutrin Xl 300 Mg Xr24h-tab (Bupropion hcl) .Marland Kitchen.. 1 by mouth qam Prescriptions: NORCO 10-325 MG TABS (HYDROCODONE-ACETAMINOPHEN) 1 by mouth every 6 hours as needed  #30 x 0  Entered and Authorized by:   Loreen Freud DO   Signed by:   Loreen Freud DO on 07/18/2010   Method used:   Print then Give to Patient   RxID:   [id number] ALPRAZOLAM 0.5 MG TABS (ALPRAZOLAM) TAKE ONE TABLET DAILY  #30 x 0   Entered and Authorized by:   Loreen Freud DO   Signed by:   Loreen Freud DO on 07/18/2010   Method used:   Print then Give to Patient   RxID:   [id number] ADDERALL 30 MG TABS (AMPHETAMINE-DEXTROAMPHETAMINE) 1 by mouth two times a day  #60 x 0   Entered and Authorized by:   Loreen Freud DO   Signed by:   Loreen Freud DO on 07/18/2010   Method used:   Print then Give to Patient   RxID:   [id number]    Orders Added: 1)  Est. Patient Level III [01027]

## 2010-08-15 ENCOUNTER — Telehealth: Payer: Self-pay | Admitting: Family Medicine

## 2010-08-22 NOTE — Progress Notes (Signed)
Summary: refill  Phone Note Refill Request Call back at Work Phone [phone]   Refills Requested: Medication #1:  ADDERALL 30 MG TABS 1 by mouth two times a day  Medication #2:  ALPRAZOLAM 0.5 MG TABS TAKE ONE TABLET DAILY  Medication #3:  NORCO 10-325 MG TABS 1 by mouth every 6 hours as needed alprazolam and norco last filled 06-18-10 pt last seen 07-18-10........Marland KitchenFelecia Deloach CMA  August 15, 2010 12:22 PM    Follow-up for Phone Call        refill x1 each Follow-up by: Loreen Freud DO,  August 15, 2010 1:41 PM  Additional Follow-up for Phone Call Additional follow up Details #1::        Pt aware Rx ready for pick-up........Marland KitchenFelecia Deloach CMA  August 15, 2010 4:00 PM     Prescriptions: ADDERALL 30 MG TABS (AMPHETAMINE-DEXTROAMPHETAMINE) 1 by mouth two times a day  #60 x 0   Entered by:   Jeremy Johann CMA   Authorized by:   Loreen Freud DO   Signed by:   Jeremy Johann CMA on 08/15/2010   Method used:   Print then Give to Patient   RxID:   [id number] NORCO 10-325 MG TABS (HYDROCODONE-ACETAMINOPHEN) 1 by mouth every 6 hours as needed  #30 x 0   Entered by:   Jeremy Johann CMA   Authorized by:   Loreen Freud DO   Signed by:   Jeremy Johann CMA on 08/15/2010   Method used:   Print then Give to Patient   RxID:   [id number] ALPRAZOLAM 0.5 MG TABS (ALPRAZOLAM) TAKE ONE TABLET DAILY  #30 x 0   Entered by:   Jeremy Johann CMA   Authorized by:   Loreen Freud DO   Signed by:   Jeremy Johann CMA on 08/15/2010   Method used:   Print then Give to Patient   RxID:   [id number]

## 2010-09-11 ENCOUNTER — Other Ambulatory Visit: Payer: Self-pay | Admitting: *Deleted

## 2010-09-11 MED ORDER — AMPHETAMINE-DEXTROAMPHETAMINE 30 MG PO TABS: 30.0000 mg | ORAL_TABLET | Freq: Two times a day (BID) | ORAL | Status: DC

## 2010-09-11 NOTE — Telephone Encounter (Signed)
Last OV 07-18-10, last filled 08-15-10 #30  ALPRAZOLAM 0.5 MG,NORCO 10-325 MG TABS.Felecia Hanzel Pizzo CMA

## 2010-09-12 MED ORDER — AMPHETAMINE-DEXTROAMPHETAMINE 30 MG PO TABS: 30.0000 mg | ORAL_TABLET | Freq: Two times a day (BID) | ORAL | Status: DC

## 2010-09-12 MED ORDER — ALPRAZOLAM 0.5 MG PO TABS: 0.5000 mg | ORAL_TABLET | ORAL | Status: DC

## 2010-09-12 MED ORDER — HYDROCODONE-ACETAMINOPHEN 10-325 MG PO TABS: 1.0000 | ORAL_TABLET | Freq: Four times a day (QID) | ORAL | Status: DC | PRN

## 2010-09-12 NOTE — Telephone Encounter (Signed)
Pt aware that RX is ready for pick up      KP

## 2010-09-12 NOTE — Telephone Encounter (Signed)
Ok to refill x1  1 refill 

## 2010-10-08 ENCOUNTER — Other Ambulatory Visit: Payer: Self-pay | Admitting: *Deleted

## 2010-10-08 MED ORDER — ALPRAZOLAM 0.5 MG PO TABS: 0.5000 mg | ORAL_TABLET | ORAL | Status: DC

## 2010-10-08 MED ORDER — HYDROCODONE-ACETAMINOPHEN 10-325 MG PO TABS: 1.0000 | ORAL_TABLET | Freq: Four times a day (QID) | ORAL | Status: DC | PRN

## 2010-10-08 MED ORDER — AMPHETAMINE-DEXTROAMPHETAMINE 30 MG PO TABS: 30.0000 mg | ORAL_TABLET | Freq: Two times a day (BID) | ORAL | Status: DC

## 2010-10-08 NOTE — Telephone Encounter (Signed)
Pt aware rx ready for pick up. 
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
## 2010-11-06 ENCOUNTER — Other Ambulatory Visit: Payer: Self-pay

## 2010-11-06 NOTE — Telephone Encounter (Signed)
Last seen 07/18/10 and filled 10/08/10 please advise    KP

## 2010-11-07 MED ORDER — AMPHETAMINE-DEXTROAMPHETAMINE 30 MG PO TABS: 30.0000 mg | ORAL_TABLET | Freq: Two times a day (BID) | ORAL | Status: DC

## 2010-11-07 MED ORDER — ALPRAZOLAM 0.5 MG PO TABS: 0.5000 mg | ORAL_TABLET | ORAL | Status: DC

## 2010-11-07 MED ORDER — HYDROCODONE-ACETAMINOPHEN 10-325 MG PO TABS: 1.0000 | ORAL_TABLET | Freq: Four times a day (QID) | ORAL | Status: DC | PRN

## 2010-12-09 ENCOUNTER — Other Ambulatory Visit: Payer: Self-pay | Admitting: *Deleted

## 2010-12-09 NOTE — Telephone Encounter (Signed)
Ok to refill 

## 2010-12-10 MED ORDER — AMPHETAMINE-DEXTROAMPHETAMINE 30 MG PO TABS: 30.0000 mg | ORAL_TABLET | Freq: Two times a day (BID) | ORAL | Status: DC

## 2010-12-10 MED ORDER — HYDROCODONE-ACETAMINOPHEN 10-325 MG PO TABS: 1.0000 | ORAL_TABLET | Freq: Four times a day (QID) | ORAL | Status: DC | PRN

## 2010-12-10 MED ORDER — ALPRAZOLAM 0.5 MG PO TABS: 0.5000 mg | ORAL_TABLET | ORAL | Status: DC

## 2010-12-10 NOTE — Telephone Encounter (Signed)
Printed and left at check in    KP

## 2011-01-08 ENCOUNTER — Other Ambulatory Visit: Payer: Self-pay | Admitting: *Deleted

## 2011-01-08 MED ORDER — AMPHETAMINE-DEXTROAMPHETAMINE 30 MG PO TABS: 30.0000 mg | ORAL_TABLET | Freq: Two times a day (BID) | ORAL | Status: DC

## 2011-01-08 NOTE — Telephone Encounter (Signed)
Last seen 07/18/10 and filled 12-10-10, xanax, hydrocodone #30

## 2011-01-09 MED ORDER — ALPRAZOLAM 0.5 MG PO TABS: ORAL_TABLET | ORAL | Status: DC

## 2011-01-09 MED ORDER — AMPHETAMINE-DEXTROAMPHETAMINE 30 MG PO TABS: 30.0000 mg | ORAL_TABLET | Freq: Two times a day (BID) | ORAL | Status: DC

## 2011-01-09 MED ORDER — HYDROCODONE-ACETAMINOPHEN 10-325 MG PO TABS: 1.0000 | ORAL_TABLET | Freq: Four times a day (QID) | ORAL | Status: DC | PRN

## 2011-01-09 NOTE — Telephone Encounter (Signed)
Refill all x1---due for ov next month

## 2011-01-09 NOTE — Telephone Encounter (Signed)
VM left advising Rx ready for pick Up      KP

## 2011-02-10 ENCOUNTER — Encounter: Payer: Self-pay | Admitting: Family Medicine

## 2011-02-11 ENCOUNTER — Encounter: Payer: Self-pay | Admitting: Family Medicine

## 2011-02-11 ENCOUNTER — Ambulatory Visit (INDEPENDENT_AMBULATORY_CARE_PROVIDER_SITE_OTHER): Payer: BC Managed Care – PPO | Admitting: Family Medicine

## 2011-02-11 VITALS — BP 108/70 | HR 65 | Temp 99.1°F | Wt 117.4 lb

## 2011-02-11 DIAGNOSIS — G8929 Other chronic pain: Secondary | ICD-10-CM | POA: Insufficient documentation

## 2011-02-11 DIAGNOSIS — F329 Major depressive disorder, single episode, unspecified: Secondary | ICD-10-CM

## 2011-02-11 DIAGNOSIS — M549 Dorsalgia, unspecified: Secondary | ICD-10-CM

## 2011-02-11 DIAGNOSIS — F411 Generalized anxiety disorder: Secondary | ICD-10-CM

## 2011-02-11 DIAGNOSIS — F419 Anxiety disorder, unspecified: Secondary | ICD-10-CM

## 2011-02-11 DIAGNOSIS — F988 Other specified behavioral and emotional disorders with onset usually occurring in childhood and adolescence: Secondary | ICD-10-CM

## 2011-02-11 MED ORDER — HYDROCODONE-ACETAMINOPHEN 10-325 MG PO TABS: 1.0000 | ORAL_TABLET | Freq: Four times a day (QID) | ORAL | Status: DC | PRN

## 2011-02-11 MED ORDER — BUPROPION HCL ER (XL) 150 MG PO TB24: 150.0000 mg | ORAL_TABLET | ORAL | Status: DC

## 2011-02-11 MED ORDER — AMPHETAMINE-DEXTROAMPHETAMINE 30 MG PO TABS: 30.0000 mg | ORAL_TABLET | Freq: Every day | ORAL | Status: DC

## 2011-02-11 MED ORDER — ALPRAZOLAM 0.5 MG PO TABS: ORAL_TABLET | ORAL | Status: DC

## 2011-02-11 MED ORDER — AMPHETAMINE-DEXTROAMPHETAMINE 30 MG PO TABS: ORAL_TABLET | ORAL | Status: DC

## 2011-02-11 MED ORDER — AMPHETAMINE-DEXTROAMPHETAMINE 30 MG PO TABS: 30.0000 mg | ORAL_TABLET | Freq: Two times a day (BID) | ORAL | Status: DC

## 2011-02-11 NOTE — Assessment & Plan Note (Signed)
Refill meds stable 

## 2011-02-11 NOTE — Progress Notes (Signed)
  Subjective:    Patient ID: Amy Wiley, female    DOB: Sep 05, 1970, 40 y.o.   MRN: [id number]  HPI Pt here f/u ADD and anxiety.  Pt doing great.  No complaints.     Review of Systems As above    Objective:   Physical Exam  Constitutional: She is oriented to person, place, and time. She appears well-developed and well-nourished.  Cardiovascular: Normal rate, regular rhythm and normal heart sounds.   Pulmonary/Chest: Effort normal and breath sounds normal. No respiratory distress. She has no wheezes. She has no rales. She exhibits no tenderness.  Neurological: She is alert and oriented to person, place, and time.  Psychiatric: She has a normal mood and affect. Her behavior is normal. Judgment and thought content normal.          Assessment & Plan:

## 2011-02-11 NOTE — Assessment & Plan Note (Signed)
con't meds Doing well 

## 2011-02-11 NOTE — Assessment & Plan Note (Signed)
Stable. Refill meds

## 2011-02-11 NOTE — Patient Instructions (Signed)
Attention Deficit-Hyperactivity Disorder ADHD Attention deficit-hyperactivity disorder (ADHD) is a problem with behavior issues based on the way the brain functions (neurobehavioral disorder). It is a common reason for behavior and academic problems in school. CAUSES The cause of ADHD is unknown in most cases. It may run in families. It sometimes can be associated with learning disabilities and other behavioral problems. SYMPTOMS There are three types of ADHD. Some of the symptoms include:  Inattentive   Gets bored or distracted easily   Loses or forgets things. Forgets to hand in homework.   Has trouble organizing or completing tasks.   Difficulty staying on task.   An inability to organize daily tasks and school work.   Leaving projects, chores and homework unfinished.   Trouble paying attention or responding to details. Careless mistakes.   Difficulty following directions. Often seems like is not listening.   Dislikes activities that require sustained attention (like chores or homework).   Hyperactive-impulsive   Feels like it is impossible to sit still or stay in a seat. Fidgeting with hands and feet.   Trouble waiting turn.   Talking too much or out of turn. Interruptive.   Speaks or acts impulsively   Aggressive, disruptive behavior   Constantly busy or on the go, noisy.   Combined   Has symptoms of both of the above.  Often children with ADHD feel discouraged about themselves and with school. They often perform well below their abilities in school. These symptoms can cause problems in home, school, and in relationships with peers. As children get older, the excess motor activities can calm down, but the problems with paying attention and staying organized persist. Most children do not outgrow ADHD but with good treatment can learn to cope with the symptoms. DIAGNOSIS When ADHD is suspected, the diagnosis should be made by professionals trained in ADHD.    Diagnosis will include:  Ruling out other reasons for the child's behavior.   The caregivers will check with the child's school and check their medical records.   They will talk to teachers and parents.   Behavior rating scales for the child will be filled out by those dealing with the child on a daily basis.  A diagnosis is made only after all information has been considered. TREATMENT Treatment usually includes behavioral treatment often along with medicines. It may include stimulant medicines. The stimulant medicines decrease impulsivity and hyperactivity and increase attention. Other medicines used include antidepressants and certain blood pressure medicines. Most experts agree that treatment for ADHD should address all aspects of the child's functioning. Treatment should not be limited to the use of medicines alone. Treatment should include structured classroom management. The parents must receive education to address rewarding good behavior, discipline and limit-setting. Tutoring and/or behavioral therapy should be available for the child. If untreated, the disorder can have long term serious effects into adolescence and adulthood. HOMECARE INSTRUCTIONS   Often with ADHD there is a lot of frustration among the family in dealing with the illness. There is often blame and anger that is not warranted. This is a life long illness. There is no way to prevent ADHD. In many cases, because the problem affects the family as a whole, the entire family may need help. A therapist can help the family find better ways to handle the disruptive behaviors and promote change. If the child is young, most of the therapist's work is with the parents. Parents will learn techniques for coping with and improving their child's behavior.   Sometimes only the child with the ADHD needs counseling. Your caregivers can help you make these decisions.   Children with ADHD may need help in organizing. Here are some helpful  tips:   Keep routines the same every day from wake-up time to bedtime. Schedule everything. This includes homework and playtime. This should include outdoor and indoor recreation. Keep the schedule on the refrigerator or a bulletin board where it is frequently seen. Mark schedule changes as far in advance as possible.   Have a place for everything and keep everything in its place. This includes clothing, backpacks, and school supplies.   Encourage writing down assignments and bringing home needed books.   Offer your child a well-balanced diet. Breakfast is especially important for school performance. Children should avoid drinks with caffeine including:   Soft drinks.   Coffee.   Tea.   However, some older children (adolescents) may find these drinks helpful in improving their attention.   Children with ADHD need consistent rules that they can understand and follow. If rules are followed, give small rewards. Children with ADHD often receive, and expect, criticism. Look for good behavior and praise it. Set realistic goals. Give clear instructions. Look for activities that can foster success and self-esteem. Make time for pleasant activities with your child. Give lots of affection.   Parents are their children's greatest advocates. Learn as much as possible about ADHD. This helps you become a stronger and better advocate for your child. It also helps you educate your child's teachers and instructors if they feel inadequate in these areas. Parent support groups are often helpful. A national group with local chapters is called CHADD (Children and Adults with Attention Deficit/Hyperactivity Disorder).  PROGNOSIS  There is no cure for ADHD. Children with the disorder seldom outgrow it. Many find adaptive ways to accommodate the ADHD as they mature. SEEK MEDICAL CARE IF YOUR CHILD HAS:  Repeated muscle twitches, cough or speech outbursts.   Sleep problems.   Marked loss of appetite.    Depression.   New or worsening behavioral problems.   Dizziness.   Racing heart.   Stomach pains.   Headaches.  Document Released: 05/23/2002 Document Re-Released: 03/11/2008 [employer] Patient Information 2011 [employer]. 

## 2011-03-11 ENCOUNTER — Other Ambulatory Visit: Payer: Self-pay | Admitting: *Deleted

## 2011-03-11 DIAGNOSIS — F419 Anxiety disorder, unspecified: Secondary | ICD-10-CM

## 2011-03-11 DIAGNOSIS — M549 Dorsalgia, unspecified: Secondary | ICD-10-CM

## 2011-03-11 MED ORDER — ALPRAZOLAM 0.5 MG PO TABS: ORAL_TABLET | ORAL | Status: DC

## 2011-03-11 MED ORDER — HYDROCODONE-ACETAMINOPHEN 10-325 MG PO TABS: 1.0000 | ORAL_TABLET | Freq: Four times a day (QID) | ORAL | Status: DC | PRN

## 2011-03-11 NOTE — Telephone Encounter (Signed)
Ok to refill both x 1  

## 2011-03-11 NOTE — Telephone Encounter (Signed)
Advised patient Rx had been faxed and she agreed   KP

## 2011-03-11 NOTE — Telephone Encounter (Signed)
Pt is requesting refill on hydrocodone and xanax.  Last seen on 8/28 for 6 month follow up.  No additional appointments.  Meds last filled on 02/11/11.  Pt given #30 no refills on both.  Please advise.  Pt would like to pick up tomorrow afternoon.

## 2011-04-08 ENCOUNTER — Other Ambulatory Visit: Payer: Self-pay | Admitting: *Deleted

## 2011-04-08 DIAGNOSIS — M549 Dorsalgia, unspecified: Secondary | ICD-10-CM

## 2011-04-08 DIAGNOSIS — F419 Anxiety disorder, unspecified: Secondary | ICD-10-CM

## 2011-04-08 MED ORDER — HYDROCODONE-ACETAMINOPHEN 10-325 MG PO TABS: 1.0000 | ORAL_TABLET | Freq: Four times a day (QID) | ORAL | Status: DC | PRN

## 2011-04-08 MED ORDER — ALPRAZOLAM 0.5 MG PO TABS: ORAL_TABLET | ORAL | Status: DC

## 2011-04-08 NOTE — Telephone Encounter (Signed)
Last seen 02/11/11 and both filled 03/11/11 please advise      KP

## 2011-04-08 NOTE — Telephone Encounter (Signed)
Faxed.   KP 

## 2011-04-10 ENCOUNTER — Telehealth: Payer: Self-pay | Admitting: Family Medicine

## 2011-04-10 NOTE — Telephone Encounter (Signed)
Faxed.   KP 

## 2011-05-12 ENCOUNTER — Other Ambulatory Visit: Payer: Self-pay | Admitting: *Deleted

## 2011-05-12 DIAGNOSIS — M549 Dorsalgia, unspecified: Secondary | ICD-10-CM

## 2011-05-12 DIAGNOSIS — F988 Other specified behavioral and emotional disorders with onset usually occurring in childhood and adolescence: Secondary | ICD-10-CM

## 2011-05-12 DIAGNOSIS — F419 Anxiety disorder, unspecified: Secondary | ICD-10-CM

## 2011-05-12 MED ORDER — AMPHETAMINE-DEXTROAMPHETAMINE 30 MG PO TABS: 30.0000 mg | ORAL_TABLET | Freq: Every day | ORAL | Status: DC

## 2011-05-12 MED ORDER — AMPHETAMINE-DEXTROAMPHETAMINE 30 MG PO TABS: ORAL_TABLET | ORAL | Status: DC

## 2011-05-12 MED ORDER — AMPHETAMINE-DEXTROAMPHETAMINE 30 MG PO TABS: 30.0000 mg | ORAL_TABLET | Freq: Two times a day (BID) | ORAL | Status: DC

## 2011-05-12 MED ORDER — HYDROCODONE-ACETAMINOPHEN 10-325 MG PO TABS: 1.0000 | ORAL_TABLET | Freq: Four times a day (QID) | ORAL | Status: DC | PRN

## 2011-05-12 MED ORDER — ALPRAZOLAM 0.5 MG PO TABS: ORAL_TABLET | ORAL | Status: DC

## 2011-05-12 NOTE — Telephone Encounter (Signed)
Ok to fill 

## 2011-05-12 NOTE — Telephone Encounter (Signed)
Faxed Xanax and hydrocodone--and patient aware Adderall ready for pick up    KP

## 2011-05-12 NOTE — Telephone Encounter (Signed)
Vicodin, xanax Last OV 02-11-11, last filled 04-08-11

## 2011-05-12 NOTE — Telephone Encounter (Signed)
Ok to refill 

## 2011-06-06 ENCOUNTER — Other Ambulatory Visit: Payer: Self-pay

## 2011-06-06 DIAGNOSIS — M549 Dorsalgia, unspecified: Secondary | ICD-10-CM

## 2011-06-06 DIAGNOSIS — F419 Anxiety disorder, unspecified: Secondary | ICD-10-CM

## 2011-06-06 MED ORDER — ALPRAZOLAM 0.5 MG PO TABS
ORAL_TABLET | ORAL | Status: DC
Start: 2011-06-06 — End: 2011-07-03

## 2011-06-06 MED ORDER — HYDROCODONE-ACETAMINOPHEN 10-325 MG PO TABS: 1.0000 | ORAL_TABLET | Freq: Four times a day (QID) | ORAL | Status: DC | PRN

## 2011-06-06 NOTE — Telephone Encounter (Signed)
Last seen 02/11/11 and filled 05/12/11.   Please advise      KP

## 2011-06-08 ENCOUNTER — Other Ambulatory Visit: Payer: Self-pay | Admitting: Family Medicine

## 2011-07-02 ENCOUNTER — Other Ambulatory Visit: Payer: Self-pay | Admitting: *Deleted

## 2011-07-02 DIAGNOSIS — M549 Dorsalgia, unspecified: Secondary | ICD-10-CM

## 2011-07-02 DIAGNOSIS — F419 Anxiety disorder, unspecified: Secondary | ICD-10-CM

## 2011-07-02 NOTE — Telephone Encounter (Signed)
Last OV 02-04-11, last filled 06-06-11 #30 for both meds

## 2011-07-02 NOTE — Telephone Encounter (Signed)
Ok to refill both x1---  Ov in february

## 2011-07-03 MED ORDER — HYDROCODONE-ACETAMINOPHEN 10-325 MG PO TABS: 1.0000 | ORAL_TABLET | Freq: Four times a day (QID) | ORAL | Status: DC | PRN

## 2011-07-03 MED ORDER — ALPRAZOLAM 0.5 MG PO TABS: ORAL_TABLET | ORAL | Status: DC

## 2011-07-03 NOTE — Telephone Encounter (Signed)
Faxed.   KP 

## 2011-07-29 ENCOUNTER — Encounter: Payer: Self-pay | Admitting: Family Medicine

## 2011-07-29 ENCOUNTER — Ambulatory Visit (INDEPENDENT_AMBULATORY_CARE_PROVIDER_SITE_OTHER): Payer: BC Managed Care – PPO | Admitting: Family Medicine

## 2011-07-29 VITALS — BP 92/58 | HR 80 | Temp 98.9°F | Wt 110.0 lb

## 2011-07-29 DIAGNOSIS — M549 Dorsalgia, unspecified: Secondary | ICD-10-CM

## 2011-07-29 DIAGNOSIS — G8929 Other chronic pain: Secondary | ICD-10-CM

## 2011-07-29 DIAGNOSIS — F988 Other specified behavioral and emotional disorders with onset usually occurring in childhood and adolescence: Secondary | ICD-10-CM

## 2011-07-29 DIAGNOSIS — F419 Anxiety disorder, unspecified: Secondary | ICD-10-CM

## 2011-07-29 DIAGNOSIS — F411 Generalized anxiety disorder: Secondary | ICD-10-CM

## 2011-07-29 MED ORDER — AMPHETAMINE-DEXTROAMPHET ER 30 MG PO CP24: 30.0000 mg | ORAL_CAPSULE | ORAL | Status: DC

## 2011-07-29 MED ORDER — AMPHETAMINE-DEXTROAMPHET ER 30 MG PO CP24: ORAL_CAPSULE | ORAL | Status: DC

## 2011-07-29 MED ORDER — HYDROCODONE-ACETAMINOPHEN 10-325 MG PO TABS: 1.0000 | ORAL_TABLET | Freq: Four times a day (QID) | ORAL | Status: DC | PRN

## 2011-07-29 MED ORDER — AMPHETAMINE-DEXTROAMPHETAMINE 30 MG PO TABS: 30.0000 mg | ORAL_TABLET | Freq: Two times a day (BID) | ORAL | Status: DC

## 2011-07-29 MED ORDER — ALPRAZOLAM 0.5 MG PO TABS: ORAL_TABLET | ORAL | Status: DC

## 2011-07-29 NOTE — Patient Instructions (Signed)
Attention Deficit Hyperactivity Disorder Attention deficit hyperactivity disorder (ADHD) is a problem with behavior issues based on the way the brain functions (neurobehavioral disorder). It is a common reason for behavior and academic problems in school. CAUSES  The cause of ADHD is unknown in most cases. It may run in families. It sometimes can be associated with learning disabilities and other behavioral problems. SYMPTOMS  There are 3 types of ADHD. The 3 types and some of the symptoms include:  Inattentive   Gets bored or distracted easily.   Loses or forgets things. Forgets to hand in homework.   Has trouble organizing or completing tasks.   Difficulty staying on task.   An inability to organize daily tasks and school work.   Leaving projects, chores, or homework unfinished.   Trouble paying attention or responding to details. Careless mistakes.   Difficulty following directions. Often seems like is not listening.   Dislikes activities that require sustained attention (like chores or homework).   Hyperactive-impulsive   Feels like it is impossible to sit still or stay in a seat. Fidgeting with hands and feet.   Trouble waiting turn.   Talking too much or out of turn. Interruptive.   Speaks or acts impulsively.   Aggressive, disruptive behavior.   Constantly busy or on the go, noisy.   Combined   Has symptoms of both of the above.  Often children with ADHD feel discouraged about themselves and with school. They often perform well below their abilities in school. These symptoms can cause problems in home, school, and in relationships with peers. As children get older, the excess motor activities can calm down, but the problems with paying attention and staying organized persist. Most children do not outgrow ADHD but with good treatment can learn to cope with the symptoms. DIAGNOSIS  When ADHD is suspected, the diagnosis should be made by professionals trained in  ADHD.  Diagnosis will include:  Ruling out other reasons for the child's behavior.   The caregivers will check with the child's school and check their medical records.   They will talk to teachers and parents.   Behavior rating scales for the child will be filled out by those dealing with the child on a daily basis.  A diagnosis is made only after all information has been considered. TREATMENT  Treatment usually includes behavioral treatment often along with medicines. It may include stimulant medicines. The stimulant medicines decrease impulsivity and hyperactivity and increase attention. Other medicines used include antidepressants and certain blood pressure medicines. Most experts agree that treatment for ADHD should address all aspects of the child's functioning. Treatment should not be limited to the use of medicines alone. Treatment should include structured classroom management. The parents must receive education to address rewarding good behavior, discipline, and limit-setting. Tutoring or behavioral therapy or both should be available for the child. If untreated, the disorder can have long-term serious effects into adolescence and adulthood. HOME CARE INSTRUCTIONS   Often with ADHD there is a lot of frustration among the family in dealing with the illness. There is often blame and anger that is not warranted. This is a life long illness. There is no way to prevent ADHD. In many cases, because the problem affects the family as a whole, the entire family may need help. A therapist can help the family find better ways to handle the disruptive behaviors and promote change. If the child is young, most of the therapist's work is with the parents. Parents will   learn techniques for coping with and improving their child's behavior. Sometimes only the child with the ADHD needs counseling. Your caregivers can help you make these decisions.   Children with ADHD may need help in organizing. Some  helpful tips include:   Keep routines the same every day from wake-up time to bedtime. Schedule everything. This includes homework and playtime. This should include outdoor and indoor recreation. Keep the schedule on the refrigerator or a bulletin board where it is frequently seen. Mark schedule changes as far in advance as possible.   Have a place for everything and keep everything in its place. This includes clothing, backpacks, and school supplies.   Encourage writing down assignments and bringing home needed books.   Offer your child a well-balanced diet. Breakfast is especially important for school performance. Children should avoid drinks with caffeine including:   Soft drinks.   Coffee.   Tea.   However, some older children (adolescents) may find these drinks helpful in improving their attention.   Children with ADHD need consistent rules that they can understand and follow. If rules are followed, give small rewards. Children with ADHD often receive, and expect, criticism. Look for good behavior and praise it. Set realistic goals. Give clear instructions. Look for activities that can foster success and self-esteem. Make time for pleasant activities with your child. Give lots of affection.   Parents are their children's greatest advocates. Learn as much as possible about ADHD. This helps you become a stronger and better advocate for your child. It also helps you educate your child's teachers and instructors if they feel inadequate in these areas. Parent support groups are often helpful. A national group with local chapters is called CHADD (Children and Adults with Attention Deficit Hyperactivity Disorder).  PROGNOSIS  There is no cure for ADHD. Children with the disorder seldom outgrow it. Many find adaptive ways to accommodate the ADHD as they mature. SEEK MEDICAL CARE IF:  Your child has repeated muscle twitches, cough or speech outbursts.   Your child has sleep problems.   Your  child has a marked loss of appetite.   Your child develops depression.   Your child has new or worsening behavioral problems.   Your child develops dizziness.   Your child has a racing heart.   Your child has stomach pains.   Your child develops headaches.  Document Released: 05/23/2002 Document Revised: 02/12/2011 Document Reviewed: 01/03/2008 [employer] Patient Information 2012 [employer]. 

## 2011-07-29 NOTE — Assessment & Plan Note (Signed)
con't meds rto 6 months 

## 2011-07-29 NOTE — Assessment & Plan Note (Signed)
Stable   meds refilled

## 2011-07-29 NOTE — Assessment & Plan Note (Signed)
Stable. Refill meds

## 2011-07-29 NOTE — Progress Notes (Signed)
  Subjective:    Patient ID: Amy Wiley, female    DOB: [birth year], 41 y.o.   MRN: [id number]  HPI Pt here for f/u add, anxiety and pain.  Pt with no new complaints.  Pt doing well with meds.   Review of Systems As above    Objective:   Physical Exam  Constitutional: She is oriented to person, place, and time. She appears well-developed and well-nourished.  Cardiovascular: Normal rate and regular rhythm.   No murmur heard. Pulmonary/Chest: Effort normal and breath sounds normal. No respiratory distress. She has no wheezes. She has no rales. She exhibits no tenderness.  Musculoskeletal: She exhibits no edema and no tenderness.  Neurological: She is alert and oriented to person, place, and time.  Psychiatric: She has a normal mood and affect. Her behavior is normal. Judgment and thought content normal.          Assessment & Plan:

## 2011-08-26 ENCOUNTER — Other Ambulatory Visit: Payer: Self-pay | Admitting: Family Medicine

## 2011-08-26 DIAGNOSIS — F419 Anxiety disorder, unspecified: Secondary | ICD-10-CM

## 2011-08-26 DIAGNOSIS — M549 Dorsalgia, unspecified: Secondary | ICD-10-CM

## 2011-08-26 MED ORDER — ALPRAZOLAM 0.5 MG PO TABS: ORAL_TABLET | ORAL | Status: DC

## 2011-08-26 MED ORDER — HYDROCODONE-ACETAMINOPHEN 10-325 MG PO TABS: 1.0000 | ORAL_TABLET | Freq: Four times a day (QID) | ORAL | Status: DC | PRN

## 2011-08-26 NOTE — Telephone Encounter (Signed)
rrx refill for  Hydrocodon-Acetaminophn 10-325  Qty not listed  Take 1-tablet every 6-hrs as needed Last filled 07/03/11  Also needs  Alprazolam 0.5MG  tablet  Qty also not listed  Take 1-tablet every day Last filled 07/03/11

## 2011-08-26 NOTE — Telephone Encounter (Signed)
Last seen and filled 07/29/11. Please advise    KP

## 2011-09-05 ENCOUNTER — Other Ambulatory Visit: Payer: Self-pay | Admitting: Family Medicine

## 2011-09-05 MED ORDER — BUPROPION HCL ER (XL) 150 MG PO TB24: 150.0000 mg | ORAL_TABLET | ORAL | Status: DC

## 2011-09-05 NOTE — Telephone Encounter (Signed)
Refill for  Bupropion HCL XL 150MG  Tablet Qty 90 Last written 12.24.12

## 2011-09-22 ENCOUNTER — Telehealth: Payer: Self-pay | Admitting: Family Medicine

## 2011-09-22 DIAGNOSIS — F419 Anxiety disorder, unspecified: Secondary | ICD-10-CM

## 2011-09-22 DIAGNOSIS — M549 Dorsalgia, unspecified: Secondary | ICD-10-CM

## 2011-09-22 NOTE — Telephone Encounter (Signed)
hydrocodon-acetaminophin 10-325. Take one tablet by mouth every 6 hrs as needed.

## 2011-09-22 NOTE — Telephone Encounter (Signed)
Refill x1 

## 2011-09-22 NOTE — Telephone Encounter (Signed)
Last seen 07/29/11 and filled 08/26/11 # 30 Please advise    KP

## 2011-09-22 NOTE — Telephone Encounter (Signed)
Request for new prescription for controlled substance  Alprazolam 0.5mg  tablet. Take one tablet by mouth every day.

## 2011-09-23 MED ORDER — HYDROCODONE-ACETAMINOPHEN 10-325 MG PO TABS: 1.0000 | ORAL_TABLET | Freq: Four times a day (QID) | ORAL | Status: DC | PRN

## 2011-09-23 MED ORDER — ALPRAZOLAM 0.5 MG PO TABS: ORAL_TABLET | ORAL | Status: DC

## 2011-09-23 NOTE — Telephone Encounter (Signed)
Faxed.   KP 

## 2011-10-23 ENCOUNTER — Other Ambulatory Visit: Payer: Self-pay | Admitting: Family Medicine

## 2011-10-23 DIAGNOSIS — M549 Dorsalgia, unspecified: Secondary | ICD-10-CM

## 2011-10-23 DIAGNOSIS — F419 Anxiety disorder, unspecified: Secondary | ICD-10-CM

## 2011-10-23 MED ORDER — ALPRAZOLAM 0.5 MG PO TABS: ORAL_TABLET | ORAL | Status: DC

## 2011-10-23 MED ORDER — HYDROCODONE-ACETAMINOPHEN 10-325 MG PO TABS: 1.0000 | ORAL_TABLET | Freq: Four times a day (QID) | ORAL | Status: DC | PRN

## 2011-10-23 NOTE — Telephone Encounter (Signed)
Seen 07/29/11 and both filled 09/23/11. Please advise    KP

## 2011-10-23 NOTE — Telephone Encounter (Signed)
Refill both x1 

## 2011-10-23 NOTE — Telephone Encounter (Signed)
refills x 2  Alprazolam 0.5mg  Take one tablet by mouth every day  Last written 3.12.13, qty 30 Last OV 2.12.13   &  Hydrocodon-acetaminophn 10-325 Take one tablet by mouth every 6-hours as needed for pain Last written 3.12.13, qty 30 Last OV 2.12.13

## 2011-11-06 ENCOUNTER — Other Ambulatory Visit: Payer: Self-pay | Admitting: *Deleted

## 2011-11-06 DIAGNOSIS — F988 Other specified behavioral and emotional disorders with onset usually occurring in childhood and adolescence: Secondary | ICD-10-CM

## 2011-11-06 MED ORDER — AMPHETAMINE-DEXTROAMPHETAMINE 30 MG PO TABS: 30.0000 mg | ORAL_TABLET | Freq: Two times a day (BID) | ORAL | Status: DC

## 2011-11-06 NOTE — Telephone Encounter (Signed)
Pt given 3 month supply may -July. Pt aware Rx ready for pick up.

## 2011-11-18 ENCOUNTER — Other Ambulatory Visit: Payer: Self-pay | Admitting: Family Medicine

## 2011-11-18 DIAGNOSIS — F419 Anxiety disorder, unspecified: Secondary | ICD-10-CM

## 2011-11-18 DIAGNOSIS — M549 Dorsalgia, unspecified: Secondary | ICD-10-CM

## 2011-11-18 MED ORDER — ALPRAZOLAM 0.5 MG PO TABS: ORAL_TABLET | ORAL | Status: DC

## 2011-11-18 MED ORDER — HYDROCODONE-ACETAMINOPHEN 10-325 MG PO TABS: 1.0000 | ORAL_TABLET | Freq: Four times a day (QID) | ORAL | Status: DC | PRN

## 2011-11-18 NOTE — Telephone Encounter (Signed)
Rx sent 

## 2011-11-18 NOTE — Telephone Encounter (Signed)
Refill: Alprazolam 0.5mg tablet. Take 1 tablet by mouth every day. 

## 2011-11-18 NOTE — Telephone Encounter (Signed)
Last seen 07/29/11 and filled 10/23/11 #30. Please advise    KP

## 2011-11-18 NOTE — Telephone Encounter (Signed)
refill hydrocodon-acetaminophen 10-325 Take one tablet by mouth every 6-hours as needed Last wrt. 5.9.13, qty 30 last ov 2.12.13

## 2011-11-18 NOTE — Telephone Encounter (Signed)
Refill x1 

## 2011-12-15 ENCOUNTER — Telehealth: Payer: Self-pay | Admitting: Family Medicine

## 2011-12-15 DIAGNOSIS — M549 Dorsalgia, unspecified: Secondary | ICD-10-CM

## 2011-12-15 DIAGNOSIS — F419 Anxiety disorder, unspecified: Secondary | ICD-10-CM

## 2011-12-15 MED ORDER — HYDROCODONE-ACETAMINOPHEN 10-325 MG PO TABS: 1.0000 | ORAL_TABLET | Freq: Four times a day (QID) | ORAL | Status: DC | PRN

## 2011-12-15 MED ORDER — ALPRAZOLAM 0.5 MG PO TABS: ORAL_TABLET | ORAL | Status: DC

## 2011-12-15 NOTE — Telephone Encounter (Signed)
Refill x1 

## 2011-12-15 NOTE — Telephone Encounter (Signed)
Refill: Alprazolam 0.5mg  tablet. Take 1 tablet by mouth every day.

## 2011-12-15 NOTE — Telephone Encounter (Signed)
Refill: Hydrocodone-acetaminophen 10-325. Take 1 tablet by mouth every 6 hours as needed.

## 2011-12-15 NOTE — Telephone Encounter (Signed)
Last seen 07/29/11 and both filled 11/18/11. Please advise    KP

## 2012-01-08 ENCOUNTER — Encounter: Payer: Self-pay | Admitting: Family Medicine

## 2012-01-08 ENCOUNTER — Ambulatory Visit (INDEPENDENT_AMBULATORY_CARE_PROVIDER_SITE_OTHER): Payer: BC Managed Care – PPO | Admitting: Family Medicine

## 2012-01-08 VITALS — BP 95/60 | HR 86 | Temp 98.5°F | Wt 108.0 lb

## 2012-01-08 DIAGNOSIS — F988 Other specified behavioral and emotional disorders with onset usually occurring in childhood and adolescence: Secondary | ICD-10-CM

## 2012-01-08 DIAGNOSIS — F419 Anxiety disorder, unspecified: Secondary | ICD-10-CM

## 2012-01-08 DIAGNOSIS — M549 Dorsalgia, unspecified: Secondary | ICD-10-CM

## 2012-01-08 DIAGNOSIS — F411 Generalized anxiety disorder: Secondary | ICD-10-CM

## 2012-01-08 DIAGNOSIS — G8929 Other chronic pain: Secondary | ICD-10-CM

## 2012-01-08 MED ORDER — ALPRAZOLAM 0.5 MG PO TABS: ORAL_TABLET | ORAL | Status: DC

## 2012-01-08 MED ORDER — AMPHETAMINE-DEXTROAMPHETAMINE 30 MG PO TABS: ORAL_TABLET | ORAL | Status: DC

## 2012-01-08 MED ORDER — BUPROPION HCL ER (XL) 150 MG PO TB24: 150.0000 mg | ORAL_TABLET | ORAL | Status: DC

## 2012-01-08 MED ORDER — AMPHETAMINE-DEXTROAMPHETAMINE 30 MG PO TABS: 30.0000 mg | ORAL_TABLET | Freq: Two times a day (BID) | ORAL | Status: DC

## 2012-01-08 MED ORDER — HYDROCODONE-ACETAMINOPHEN 10-325 MG PO TABS: 1.0000 | ORAL_TABLET | Freq: Four times a day (QID) | ORAL | Status: DC | PRN

## 2012-01-08 NOTE — Progress Notes (Signed)
  Subjective:    Patient ID: Amy Wiley, female    DOB: 01-Aug-1970, 41 y.o.   MRN: [id number]  HPI Pt here f/u adderall , anxiety and she needs refills of meds.  No other complaints.     Review of Systems    as above Objective:   Physical Exam  Constitutional: She is oriented to person, place, and time. She appears well-developed and well-nourished.  Cardiovascular: Normal rate, regular rhythm and normal heart sounds.   No murmur heard. Pulmonary/Chest: Effort normal and breath sounds normal. No respiratory distress. She has no wheezes. She has no rales. She exhibits no tenderness.  Musculoskeletal: She exhibits no edema and no tenderness.  Neurological: She is alert and oriented to person, place, and time.  Psychiatric: She has a normal mood and affect. Her behavior is normal. Judgment and thought content normal.          Assessment & Plan:

## 2012-01-08 NOTE — Assessment & Plan Note (Signed)
Refill meds rto 6 months 

## 2012-01-08 NOTE — Patient Instructions (Signed)
Attention Deficit Hyperactivity Disorder Attention deficit hyperactivity disorder (ADHD) is a problem with behavior issues based on the way the brain functions (neurobehavioral disorder). It is a common reason for behavior and academic problems in school. CAUSES  The cause of ADHD is unknown in most cases. It may run in families. It sometimes can be associated with learning disabilities and other behavioral problems. SYMPTOMS  There are 3 types of ADHD. The 3 types and some of the symptoms include:  Inattentive   Gets bored or distracted easily.   Loses or forgets things. Forgets to hand in homework.   Has trouble organizing or completing tasks.   Difficulty staying on task.   An inability to organize daily tasks and school work.   Leaving projects, chores, or homework unfinished.   Trouble paying attention or responding to details. Careless mistakes.   Difficulty following directions. Often seems like is not listening.   Dislikes activities that require sustained attention (like chores or homework).   Hyperactive-impulsive   Feels like it is impossible to sit still or stay in a seat. Fidgeting with hands and feet.   Trouble waiting turn.   Talking too much or out of turn. Interruptive.   Speaks or acts impulsively.   Aggressive, disruptive behavior.   Constantly busy or on the go, noisy.   Combined   Has symptoms of both of the above.  Often children with ADHD feel discouraged about themselves and with school. They often perform well below their abilities in school. These symptoms can cause problems in home, school, and in relationships with peers. As children get older, the excess motor activities can calm down, but the problems with paying attention and staying organized persist. Most children do not outgrow ADHD but with good treatment can learn to cope with the symptoms. DIAGNOSIS  When ADHD is suspected, the diagnosis should be made by professionals trained in  ADHD.  Diagnosis will include:  Ruling out other reasons for the child's behavior.   The caregivers will check with the child's school and check their medical records.   They will talk to teachers and parents.   Behavior rating scales for the child will be filled out by those dealing with the child on a daily basis.  A diagnosis is made only after all information has been considered. TREATMENT  Treatment usually includes behavioral treatment often along with medicines. It may include stimulant medicines. The stimulant medicines decrease impulsivity and hyperactivity and increase attention. Other medicines used include antidepressants and certain blood pressure medicines. Most experts agree that treatment for ADHD should address all aspects of the child's functioning. Treatment should not be limited to the use of medicines alone. Treatment should include structured classroom management. The parents must receive education to address rewarding good behavior, discipline, and limit-setting. Tutoring or behavioral therapy or both should be available for the child. If untreated, the disorder can have long-term serious effects into adolescence and adulthood. HOME CARE INSTRUCTIONS   Often with ADHD there is a lot of frustration among the family in dealing with the illness. There is often blame and anger that is not warranted. This is a life long illness. There is no way to prevent ADHD. In many cases, because the problem affects the family as a whole, the entire family may need help. A therapist can help the family find better ways to handle the disruptive behaviors and promote change. If the child is young, most of the therapist's work is with the parents. Parents will   learn techniques for coping with and improving their child's behavior. Sometimes only the child with the ADHD needs counseling. Your caregivers can help you make these decisions.   Children with ADHD may need help in organizing. Some  helpful tips include:   Keep routines the same every day from wake-up time to bedtime. Schedule everything. This includes homework and playtime. This should include outdoor and indoor recreation. Keep the schedule on the refrigerator or a bulletin board where it is frequently seen. Mark schedule changes as far in advance as possible.   Have a place for everything and keep everything in its place. This includes clothing, backpacks, and school supplies.   Encourage writing down assignments and bringing home needed books.   Offer your child a well-balanced diet. Breakfast is especially important for school performance. Children should avoid drinks with caffeine including:   Soft drinks.   Coffee.   Tea.   However, some older children (adolescents) may find these drinks helpful in improving their attention.   Children with ADHD need consistent rules that they can understand and follow. If rules are followed, give small rewards. Children with ADHD often receive, and expect, criticism. Look for good behavior and praise it. Set realistic goals. Give clear instructions. Look for activities that can foster success and self-esteem. Make time for pleasant activities with your child. Give lots of affection.   Parents are their children's greatest advocates. Learn as much as possible about ADHD. This helps you become a stronger and better advocate for your child. It also helps you educate your child's teachers and instructors if they feel inadequate in these areas. Parent support groups are often helpful. A national group with local chapters is called CHADD (Children and Adults with Attention Deficit Hyperactivity Disorder).  PROGNOSIS  There is no cure for ADHD. Children with the disorder seldom outgrow it. Many find adaptive ways to accommodate the ADHD as they mature. SEEK MEDICAL CARE IF:  Your child has repeated muscle twitches, cough or speech outbursts.   Your child has sleep problems.   Your  child has a marked loss of appetite.   Your child develops depression.   Your child has new or worsening behavioral problems.   Your child develops dizziness.   Your child has a racing heart.   Your child has stomach pains.   Your child develops headaches.  Document Released: 05/23/2002 Document Revised: 05/22/2011 Document Reviewed: 01/03/2008 [employer] Patient Information 2012 [employer]. 

## 2012-01-08 NOTE — Assessment & Plan Note (Signed)
Refill meds stable 

## 2012-01-08 NOTE — Assessment & Plan Note (Signed)
Stable. Refill meds

## 2012-02-09 ENCOUNTER — Other Ambulatory Visit: Payer: Self-pay | Admitting: Family Medicine

## 2012-02-09 DIAGNOSIS — F419 Anxiety disorder, unspecified: Secondary | ICD-10-CM

## 2012-02-09 DIAGNOSIS — M549 Dorsalgia, unspecified: Secondary | ICD-10-CM

## 2012-02-09 MED ORDER — ALPRAZOLAM 0.5 MG PO TABS: ORAL_TABLET | ORAL | Status: DC

## 2012-02-09 MED ORDER — HYDROCODONE-ACETAMINOPHEN 10-325 MG PO TABS: 1.0000 | ORAL_TABLET | Freq: Four times a day (QID) | ORAL | Status: DC | PRN

## 2012-02-09 NOTE — Telephone Encounter (Signed)
Ok to refill both x 1  

## 2012-02-09 NOTE — Telephone Encounter (Signed)
Please advise      KP 

## 2012-02-09 NOTE — Telephone Encounter (Signed)
Refills x 2 last ov 7.25.13 Medication MGMT-both last fill 7.25.13 #30 no refills  1-ALPRAZolam (Tab) 0.5 MG 1 po qd  2-Hydrocodone-Acetaminophen (Tab) 10-325 MG Take 1 tablet by mouth every 6 (six) hours as needed.

## 2012-02-12 ENCOUNTER — Encounter: Payer: Self-pay | Admitting: Obstetrics and Gynecology

## 2012-02-12 ENCOUNTER — Ambulatory Visit (INDEPENDENT_AMBULATORY_CARE_PROVIDER_SITE_OTHER): Payer: BC Managed Care – PPO | Admitting: Obstetrics and Gynecology

## 2012-02-12 VITALS — BP 90/58 | Resp 16 | Ht 61.0 in | Wt 122.0 lb

## 2012-02-12 DIAGNOSIS — Z124 Encounter for screening for malignant neoplasm of cervix: Secondary | ICD-10-CM

## 2012-02-12 DIAGNOSIS — Z975 Presence of (intrauterine) contraceptive device: Secondary | ICD-10-CM | POA: Insufficient documentation

## 2012-02-12 NOTE — Progress Notes (Signed)
Regular Periods: no Mammogram: yes 03/2012  Monthly Breast Ex.: yes Exercise: yes  Tetanus < 10 years: no Seatbelts: yes  NI. Bladder Functn.: yes Abuse at home: no  Daily BM's: yes Stressful Work: no  Healthy Diet: yes Sigmoid-Colonoscopy: never   Calcium: no Medical problems this year: no concerns per pt    LAST PAP: 10/23/2010 WNL  Contraception: IUD Mirena  Mammogram:  Yes 03/2011  PCP: Loreen Freud  PMH: no changes   FMH: no changes   Last Bone Scan: never

## 2012-02-12 NOTE — Progress Notes (Signed)
Subjective:    Amy Wiley is a 41 y.o. female, who presents for an annual exam.   Patient reports:  No issues.  Daughter going to new charter school and doing well.  New horse in family, Amy Wiley. Patient pleased with Mirena--was replaced 2012.    History   Social History  . Marital Status: Married    Spouse Name: N/A    Number of Children: N/A  . Years of Education: N/A   Social History Main Topics  . Smoking status: Former Games developer  . Smokeless tobacco: Never Used  . Alcohol Use: No  . Drug Use: No  . Sexually Active: Yes -- Female partner(s)    Birth Control/ Protection: IUD     Mirena    Other Topics Concern  . None   Social History Narrative  . None    Menstrual cycle:   LMP: No LMP recorded. Patient is not currently having periods (Reason: IUD).           Cycle: None since Mirena  The following portions of the patient's history were reviewed and updated as appropriate: allergies, current medications, past family history, past medical history, past social history, past surgical history and problem list.  Review of Systems Pertinent items are noted in HPI. Breast:Negative for breast lump,nipple discharge or nipple retraction Gastrointestinal: Negative for abdominal pain, change in bowel habits or rectal bleeding Urinary:negative   Objective:    BP 90/58  Resp 16  Ht 5\' 1"  (1.549 m)    Weight:  Wt Readings from Last 1 Encounters:  01/08/12 108 lb (48.988 kg)          BMI: There is no weight on file to calculate BMI.  General Appearance: Alert, appropriate appearance for age. No acute distress HEENT: Grossly normal Neck / Thyroid: Supple, no masses, nodes or enlargement Lungs: clear to auscultation bilaterally Back: No CVA tenderness Breast Exam: No masses or nodes.No dimpling, nipple retraction or discharge. Cardiovascular: Regular rate and rhythm. S1, S2, no murmur Gastrointestinal: Soft, non-tender, no masses or organomegaly Pelvic Exam: Vulva and  vagina appear normal. Bimanual exam reveals normal uterus and adnexa. Rectovaginal: not indicated and normal rectal, no masses Lymphatic Exam: Non-palpable nodes in neck, clavicular, axillary, or inguinal regions Skin: no rash or abnormalities Neurologic: Normal gait and speech, no tremor  Psychiatric: Alert and oriented, appropriate affect.   Wet Prep:not applicable Urinalysis:not applicable UPT: Not done   Assessment:    Normal gyn exam  Mirena IUD since 2012   Plan:    Mammogram: Done October 2012--had repeat for suspicious area, f/u WNL Pap:  Done today STD screening: declined Contraception: Mirena IUD replaced last year Plan yearly annual       [place], [place], [place]

## 2012-02-13 LAB — PAP IG W/ RFLX HPV ASCU

## 2012-02-25 ENCOUNTER — Encounter: Payer: Self-pay | Admitting: Obstetrics and Gynecology

## 2012-03-08 ENCOUNTER — Other Ambulatory Visit: Payer: Self-pay | Admitting: Family Medicine

## 2012-03-08 DIAGNOSIS — M549 Dorsalgia, unspecified: Secondary | ICD-10-CM

## 2012-03-08 DIAGNOSIS — F419 Anxiety disorder, unspecified: Secondary | ICD-10-CM

## 2012-03-08 MED ORDER — HYDROCODONE-ACETAMINOPHEN 10-325 MG PO TABS: 1.0000 | ORAL_TABLET | Freq: Four times a day (QID) | ORAL | Status: DC | PRN

## 2012-03-08 MED ORDER — ALPRAZOLAM 0.5 MG PO TABS: ORAL_TABLET | ORAL | Status: DC

## 2012-03-08 NOTE — Telephone Encounter (Signed)
Last seen 01/08/12 and both filled 02/09/12. Please advise     KP

## 2012-03-08 NOTE — Telephone Encounter (Signed)
hydrocodon-acetaminophin 10-325 Last filled 02/09/12 Take 1 tablet by mouth every 8 hours as needed.

## 2012-03-08 NOTE — Telephone Encounter (Signed)
alprazolam 0.5 mg Last filled 02/09/12 Take 1 tablet by mouth every day

## 2012-03-09 NOTE — Telephone Encounter (Signed)
Pt states the pharmacy did not receive the hydrocodone.

## 2012-04-08 ENCOUNTER — Other Ambulatory Visit: Payer: Self-pay | Admitting: Family Medicine

## 2012-04-08 NOTE — Telephone Encounter (Signed)
Last seen 01/08/12 and filled 03/08/12 # 30. Please advise      KP

## 2012-04-09 ENCOUNTER — Other Ambulatory Visit: Payer: Self-pay | Admitting: Family Medicine

## 2012-04-09 MED ORDER — ALPRAZOLAM 0.5 MG PO TABS: 0.5000 mg | ORAL_TABLET | Freq: Four times a day (QID) | ORAL | Status: DC | PRN

## 2012-04-09 NOTE — Telephone Encounter (Signed)
Please advise      KP 

## 2012-04-09 NOTE — Telephone Encounter (Signed)
Ok to refill x 1  

## 2012-04-09 NOTE — Telephone Encounter (Signed)
refill Hydrocodone-Acetaminophen (Tab) 10-325 MG Take 1 tablet by mouth every 6 (six) hours as needed. #30 last fill 09.24.13 -- last ov 7.25.13

## 2012-04-12 ENCOUNTER — Other Ambulatory Visit: Payer: Self-pay

## 2012-04-12 DIAGNOSIS — M549 Dorsalgia, unspecified: Secondary | ICD-10-CM

## 2012-04-12 MED ORDER — HYDROCODONE-ACETAMINOPHEN 10-325 MG PO TABS: 1.0000 | ORAL_TABLET | Freq: Four times a day (QID) | ORAL | Status: DC | PRN

## 2012-04-12 NOTE — Telephone Encounter (Signed)
Rx faxed pt aware.     MW

## 2012-05-05 ENCOUNTER — Other Ambulatory Visit: Payer: Self-pay

## 2012-05-05 ENCOUNTER — Telehealth: Payer: Self-pay | Admitting: Family Medicine

## 2012-05-05 ENCOUNTER — Telehealth: Payer: Self-pay

## 2012-05-05 DIAGNOSIS — F988 Other specified behavioral and emotional disorders with onset usually occurring in childhood and adolescence: Secondary | ICD-10-CM

## 2012-05-05 DIAGNOSIS — M549 Dorsalgia, unspecified: Secondary | ICD-10-CM

## 2012-05-05 MED ORDER — HYDROCODONE-ACETAMINOPHEN 10-325 MG PO TABS: 1.0000 | ORAL_TABLET | Freq: Four times a day (QID) | ORAL | Status: DC | PRN

## 2012-05-05 MED ORDER — AMPHETAMINE-DEXTROAMPHETAMINE 30 MG PO TABS: 30.0000 mg | ORAL_TABLET | Freq: Two times a day (BID) | ORAL | Status: DC

## 2012-05-05 MED ORDER — ALPRAZOLAM 0.5 MG PO TABS: 0.5000 mg | ORAL_TABLET | Freq: Four times a day (QID) | ORAL | Status: DC | PRN

## 2012-05-05 MED ORDER — AMPHETAMINE-DEXTROAMPHETAMINE 30 MG PO TABS: ORAL_TABLET | ORAL | Status: DC

## 2012-05-05 NOTE — Telephone Encounter (Signed)
Patient came into the office this afternoon requesting her rx for adderall. I spoke w/Kim Suzie Portela and per Selena Batten, advised pt that rx would be ready tomorrow as Dr. Laury Axon is not here to sign the prescription. Patient states she is leaving town in the morning and needs to come pick this up as early as possible. I advised pt we would call her when the rx is ready. Call back # [phone]

## 2012-05-05 NOTE — Telephone Encounter (Signed)
Pt LMOVM requesting all these meds and advising going on vacation and don't want to be out of meds. OV 01/08/12 last filled Adderall 01/08/12 x 3 month scripts, Xanax 04/09/12 # 30, Hydrocodone 04/12/12 # 30. Pt requesting 3 month scripts Nov. Dec. And Jan. For Adderall.    Plz advise    MW

## 2012-05-05 NOTE — Telephone Encounter (Signed)
Rx printed.    KP 

## 2012-05-05 NOTE — Telephone Encounter (Signed)
Patient aware to be here and her RX will be ready by 9 am.       KP

## 2012-05-05 NOTE — Telephone Encounter (Signed)
Ok to refill all ,  adderall for 3 months

## 2012-06-01 ENCOUNTER — Other Ambulatory Visit: Payer: Self-pay | Admitting: Family Medicine

## 2012-06-01 NOTE — Telephone Encounter (Signed)
Last seen 01/08/12 and filled 05/05/12. Please advise     KP

## 2012-06-04 ENCOUNTER — Other Ambulatory Visit: Payer: Self-pay | Admitting: Family Medicine

## 2012-06-04 MED ORDER — HYDROCODONE-ACETAMINOPHEN 10-325 MG PO TABS: 1.0000 | ORAL_TABLET | Freq: Four times a day (QID) | ORAL | Status: DC | PRN

## 2012-06-04 NOTE — Telephone Encounter (Signed)
Rx's reprinted since it was approved overnight and was not printed. Left a message making the patient's husband aware the patient will need to pick up the Rx.      KP

## 2012-07-06 ENCOUNTER — Other Ambulatory Visit: Payer: Self-pay | Admitting: Family Medicine

## 2012-07-06 NOTE — Telephone Encounter (Signed)
Last seen 01/08/12 and filled 06/04/12 #30 for both. Please advise     KP

## 2012-08-02 ENCOUNTER — Encounter: Payer: Self-pay | Admitting: Family Medicine

## 2012-08-03 ENCOUNTER — Ambulatory Visit (INDEPENDENT_AMBULATORY_CARE_PROVIDER_SITE_OTHER): Payer: BC Managed Care – PPO | Admitting: Family Medicine

## 2012-08-03 ENCOUNTER — Encounter: Payer: Self-pay | Admitting: Family Medicine

## 2012-08-03 VITALS — BP 108/62 | HR 75 | Temp 98.7°F | Wt 112.0 lb

## 2012-08-03 DIAGNOSIS — F988 Other specified behavioral and emotional disorders with onset usually occurring in childhood and adolescence: Secondary | ICD-10-CM

## 2012-08-03 DIAGNOSIS — F411 Generalized anxiety disorder: Secondary | ICD-10-CM

## 2012-08-03 DIAGNOSIS — M549 Dorsalgia, unspecified: Secondary | ICD-10-CM

## 2012-08-03 DIAGNOSIS — G8929 Other chronic pain: Secondary | ICD-10-CM

## 2012-08-03 MED ORDER — AMPHETAMINE-DEXTROAMPHETAMINE 30 MG PO TABS: 30.0000 mg | ORAL_TABLET | Freq: Every day | ORAL | Status: DC

## 2012-08-03 MED ORDER — AMPHETAMINE-DEXTROAMPHETAMINE 30 MG PO TABS: 30.0000 mg | ORAL_TABLET | Freq: Two times a day (BID) | ORAL | Status: DC

## 2012-08-03 MED ORDER — HYDROCODONE-ACETAMINOPHEN 10-325 MG PO TABS: ORAL_TABLET | ORAL | Status: DC

## 2012-08-03 MED ORDER — ALPRAZOLAM 0.5 MG PO TABS: ORAL_TABLET | ORAL | Status: DC

## 2012-08-03 NOTE — Assessment & Plan Note (Signed)
Doing well Refill meds

## 2012-08-03 NOTE — Assessment & Plan Note (Signed)
Only needs prn xanax rarely Refill meds

## 2012-08-03 NOTE — Patient Instructions (Signed)
Attention Deficit Hyperactivity Disorder Attention deficit hyperactivity disorder (ADHD) is a problem with behavior issues based on the way the brain functions (neurobehavioral disorder). It is a common reason for behavior and academic problems in school. CAUSES  The cause of ADHD is unknown in most cases. It may run in families. It sometimes can be associated with learning disabilities and other behavioral problems. SYMPTOMS  There are 3 types of ADHD. The 3 types and some of the symptoms include:  Inattentive  Gets bored or distracted easily.  Loses or forgets things. Forgets to hand in homework.  Has trouble organizing or completing tasks.  Difficulty staying on task.  An inability to organize daily tasks and school work.  Leaving projects, chores, or homework unfinished.  Trouble paying attention or responding to details. Careless mistakes.  Difficulty following directions. Often seems like is not listening.  Dislikes activities that require sustained attention (like chores or homework).  Hyperactive-impulsive  Feels like it is impossible to sit still or stay in a seat. Fidgeting with hands and feet.  Trouble waiting turn.  Talking too much or out of turn. Interruptive.  Speaks or acts impulsively.  Aggressive, disruptive behavior.  Constantly busy or on the go, noisy.  Combined  Has symptoms of both of the above. Often children with ADHD feel discouraged about themselves and with school. They often perform well below their abilities in school. These symptoms can cause problems in home, school, and in relationships with peers. As children get older, the excess motor activities can calm down, but the problems with paying attention and staying organized persist. Most children do not outgrow ADHD but with good treatment can learn to cope with the symptoms. DIAGNOSIS  When ADHD is suspected, the diagnosis should be made by professionals trained in ADHD.  Diagnosis will  include:  Ruling out other reasons for the child's behavior.  The caregivers will check with the child's school and check their medical records.  They will talk to teachers and parents.  Behavior rating scales for the child will be filled out by those dealing with the child on a daily basis. A diagnosis is made only after all information has been considered. TREATMENT  Treatment usually includes behavioral treatment often along with medicines. It may include stimulant medicines. The stimulant medicines decrease impulsivity and hyperactivity and increase attention. Other medicines used include antidepressants and certain blood pressure medicines. Most experts agree that treatment for ADHD should address all aspects of the child's functioning. Treatment should not be limited to the use of medicines alone. Treatment should include structured classroom management. The parents must receive education to address rewarding good behavior, discipline, and limit-setting. Tutoring or behavioral therapy or both should be available for the child. If untreated, the disorder can have long-term serious effects into adolescence and adulthood. HOME CARE INSTRUCTIONS   Often with ADHD there is a lot of frustration among the family in dealing with the illness. There is often blame and anger that is not warranted. This is a life long illness. There is no way to prevent ADHD. In many cases, because the problem affects the family as a whole, the entire family may need help. A therapist can help the family find better ways to handle the disruptive behaviors and promote change. If the child is young, most of the therapist's work is with the parents. Parents will learn techniques for coping with and improving their child's behavior. Sometimes only the child with the ADHD needs counseling. Your caregivers can help   you make these decisions.  Children with ADHD may need help in organizing. Some helpful tips include:  Keep  routines the same every day from wake-up time to bedtime. Schedule everything. This includes homework and playtime. This should include outdoor and indoor recreation. Keep the schedule on the refrigerator or a bulletin board where it is frequently seen. Mark schedule changes as far in advance as possible.  Have a place for everything and keep everything in its place. This includes clothing, backpacks, and school supplies.  Encourage writing down assignments and bringing home needed books.  Offer your child a well-balanced diet. Breakfast is especially important for school performance. Children should avoid drinks with caffeine including:  Soft drinks.  Coffee.  Tea.  However, some older children (adolescents) may find these drinks helpful in improving their attention.  Children with ADHD need consistent rules that they can understand and follow. If rules are followed, give small rewards. Children with ADHD often receive, and expect, criticism. Look for good behavior and praise it. Set realistic goals. Give clear instructions. Look for activities that can foster success and self-esteem. Make time for pleasant activities with your child. Give lots of affection.  Parents are their children's greatest advocates. Learn as much as possible about ADHD. This helps you become a stronger and better advocate for your child. It also helps you educate your child's teachers and instructors if they feel inadequate in these areas. Parent support groups are often helpful. A national group with local chapters is called CHADD (Children and Adults with Attention Deficit Hyperactivity Disorder). PROGNOSIS  There is no cure for ADHD. Children with the disorder seldom outgrow it. Many find adaptive ways to accommodate the ADHD as they mature. SEEK MEDICAL CARE IF:  Your child has repeated muscle twitches, cough or speech outbursts.  Your child has sleep problems.  Your child has a marked loss of  appetite.  Your child develops depression.  Your child has new or worsening behavioral problems.  Your child develops dizziness.  Your child has a racing heart.  Your child has stomach pains.  Your child develops headaches. Document Released: 05/23/2002 Document Revised: 08/25/2011 Document Reviewed: 01/03/2008 Exit[employer] Patient Information 2013 [employer].  

## 2012-08-03 NOTE — Progress Notes (Signed)
  Subjective:    Patient ID: IMO CUMBIE, female    DOB: 08/31/1970, 42 y.o.   MRN: [id number]  HPI Pt here f/u add , pain and anxiety.  Pt doing well.  No complaints.     Review of Systems As above    Objective:   Physical Exam  BP 108/62  Pulse 75  Temp(Src) 98.7 F (37.1 C) (Oral)  Wt 112 lb (50.803 kg)  BMI 21.17 kg/m2  SpO2 97% General appearance: alert, cooperative, appears stated age and no distress Lungs: clear to auscultation bilaterally Heart: S1, S2 normal      Assessment & Plan:

## 2012-08-03 NOTE — Assessment & Plan Note (Signed)
Not abusing meds Pain stable

## 2012-08-30 ENCOUNTER — Telehealth: Payer: Self-pay | Admitting: Family Medicine

## 2012-08-30 DIAGNOSIS — F411 Generalized anxiety disorder: Secondary | ICD-10-CM

## 2012-08-30 MED ORDER — HYDROCODONE-ACETAMINOPHEN 10-325 MG PO TABS: ORAL_TABLET | ORAL | Status: DC

## 2012-08-30 MED ORDER — ALPRAZOLAM 0.5 MG PO TABS: ORAL_TABLET | ORAL | Status: DC

## 2012-08-30 NOTE — Telephone Encounter (Signed)
Also add alprazolam 0.5MG  tablet Take one tablet by mouth 3 times a day as needed--last fill 2.18.14

## 2012-08-30 NOTE — Telephone Encounter (Signed)
refill Hydrocodone-Acetaminophen (Tab) 10-325 MG TAKE 1 TABLET EVERY 6 HOURS AS NEEDED -- last fill 2.18.14

## 2012-08-30 NOTE — Telephone Encounter (Signed)
Refill x1 

## 2012-08-30 NOTE — Telephone Encounter (Signed)
Last seen and filled 08/03/12 # 30. Please advise     KP

## 2012-09-01 ENCOUNTER — Telehealth: Payer: Self-pay | Admitting: Family Medicine

## 2012-09-01 NOTE — Telephone Encounter (Signed)
[facility] [address] states they have not received rx for hydrocodone. They did receive the alprazolam.

## 2012-09-01 NOTE — Telephone Encounter (Deleted)
Last OV 08-03-12. Last filled 08-30-12 #30

## 2012-09-01 NOTE — Telephone Encounter (Signed)
Rx called in to pharmacy. 

## 2012-09-22 ENCOUNTER — Telehealth: Payer: Self-pay | Admitting: *Deleted

## 2012-09-22 DIAGNOSIS — F411 Generalized anxiety disorder: Secondary | ICD-10-CM

## 2012-09-22 DIAGNOSIS — G8929 Other chronic pain: Secondary | ICD-10-CM

## 2012-09-22 MED ORDER — ALPRAZOLAM 0.5 MG PO TABS: ORAL_TABLET | ORAL | Status: DC

## 2012-09-22 MED ORDER — HYDROCODONE-ACETAMINOPHEN 10-325 MG PO TABS: ORAL_TABLET | ORAL | Status: DC

## 2012-09-22 NOTE — Telephone Encounter (Signed)
Last OV 08-03-12, last filled 3-17- #30 for both Pt is leaving to go out of town on Monday and would like to go ahead and get Rx prior to leaving.Please advise .

## 2012-09-22 NOTE — Telephone Encounter (Signed)
Refill x1 

## 2012-10-28 ENCOUNTER — Telehealth: Payer: Self-pay | Admitting: General Practice

## 2012-10-28 DIAGNOSIS — F411 Generalized anxiety disorder: Secondary | ICD-10-CM

## 2012-10-28 DIAGNOSIS — F988 Other specified behavioral and emotional disorders with onset usually occurring in childhood and adolescence: Secondary | ICD-10-CM

## 2012-10-28 DIAGNOSIS — M549 Dorsalgia, unspecified: Secondary | ICD-10-CM

## 2012-10-28 MED ORDER — AMPHETAMINE-DEXTROAMPHETAMINE 30 MG PO TABS: 30.0000 mg | ORAL_TABLET | Freq: Two times a day (BID) | ORAL | Status: DC

## 2012-10-28 MED ORDER — HYDROCODONE-ACETAMINOPHEN 10-325 MG PO TABS: ORAL_TABLET | ORAL | Status: DC

## 2012-10-28 MED ORDER — ALPRAZOLAM 0.5 MG PO TABS: ORAL_TABLET | ORAL | Status: DC

## 2012-10-28 MED ORDER — AMPHETAMINE-DEXTROAMPHETAMINE 30 MG PO TABS: 30.0000 mg | ORAL_TABLET | Freq: Every day | ORAL | Status: DC

## 2012-10-28 NOTE — Telephone Encounter (Signed)
ok 

## 2012-10-28 NOTE — Telephone Encounter (Signed)
Pt called requesting a 3 month refill of Adderal (for May,June, July), also refills on Hydrocodone, and Xanax. Would like all to be printed and placed up front. Pt last seen on 08/03/12. Adderal last filled on 08/03/12, Hydrocodone and Xanax last filled on 09/22/12.

## 2012-10-28 NOTE — Telephone Encounter (Signed)
Patient aware Rx ready for pick up tomorrow.    KP 

## 2012-11-25 ENCOUNTER — Other Ambulatory Visit: Payer: Self-pay | Admitting: Family Medicine

## 2012-11-25 DIAGNOSIS — G8929 Other chronic pain: Secondary | ICD-10-CM

## 2012-11-25 MED ORDER — HYDROCODONE-ACETAMINOPHEN 10-325 MG PO TABS: ORAL_TABLET | ORAL | Status: DC

## 2012-11-25 NOTE — Telephone Encounter (Signed)
Please advise      KP 

## 2012-11-25 NOTE — Telephone Encounter (Signed)
Last OV 08-03-2012 Med filled 10-28-2013 #30 with 0 refills

## 2012-12-20 ENCOUNTER — Other Ambulatory Visit: Payer: Self-pay | Admitting: Family Medicine

## 2012-12-20 NOTE — Telephone Encounter (Signed)
Last seen 08/03/12 and filled 11/25/12. Please advise      KP

## 2013-01-20 ENCOUNTER — Ambulatory Visit (INDEPENDENT_AMBULATORY_CARE_PROVIDER_SITE_OTHER): Payer: BC Managed Care – PPO | Admitting: Family Medicine

## 2013-01-20 ENCOUNTER — Encounter: Payer: Self-pay | Admitting: Family Medicine

## 2013-01-20 VITALS — BP 114/78 | HR 108 | Temp 98.7°F | Wt 112.2 lb

## 2013-01-20 DIAGNOSIS — F411 Generalized anxiety disorder: Secondary | ICD-10-CM

## 2013-01-20 DIAGNOSIS — F17200 Nicotine dependence, unspecified, uncomplicated: Secondary | ICD-10-CM

## 2013-01-20 DIAGNOSIS — G8929 Other chronic pain: Secondary | ICD-10-CM

## 2013-01-20 DIAGNOSIS — N946 Dysmenorrhea, unspecified: Secondary | ICD-10-CM

## 2013-01-20 DIAGNOSIS — F32A Depression, unspecified: Secondary | ICD-10-CM

## 2013-01-20 DIAGNOSIS — F988 Other specified behavioral and emotional disorders with onset usually occurring in childhood and adolescence: Secondary | ICD-10-CM

## 2013-01-20 DIAGNOSIS — M549 Dorsalgia, unspecified: Secondary | ICD-10-CM

## 2013-01-20 DIAGNOSIS — F329 Major depressive disorder, single episode, unspecified: Secondary | ICD-10-CM

## 2013-01-20 MED ORDER — AMPHETAMINE-DEXTROAMPHETAMINE 30 MG PO TABS: 30.0000 mg | ORAL_TABLET | Freq: Two times a day (BID) | ORAL | Status: DC

## 2013-01-20 MED ORDER — ALPRAZOLAM 0.5 MG PO TABS: ORAL_TABLET | ORAL | Status: DC

## 2013-01-20 MED ORDER — AMPHETAMINE-DEXTROAMPHETAMINE 30 MG PO TABS: 30.0000 mg | ORAL_TABLET | Freq: Every day | ORAL | Status: DC

## 2013-01-20 MED ORDER — BUPROPION HCL ER (XL) 300 MG PO TB24: 300.0000 mg | ORAL_TABLET | Freq: Every day | ORAL | Status: DC

## 2013-01-20 MED ORDER — HYDROCODONE-ACETAMINOPHEN 10-325 MG PO TABS: 1.0000 | ORAL_TABLET | Freq: Four times a day (QID) | ORAL | Status: DC | PRN

## 2013-01-20 NOTE — Assessment & Plan Note (Signed)
Refill adderalll rto 6 months

## 2013-01-20 NOTE — Assessment & Plan Note (Signed)
Refill meds stable 

## 2013-01-20 NOTE — Patient Instructions (Addendum)
Smoking Cessation Quitting smoking is important to your health and has many advantages. However, it is not always easy to quit since nicotine is a very addictive drug. Often times, people try 3 times or more before being able to quit. This document explains the best ways for you to prepare to quit smoking. Quitting takes hard work and a lot of effort, but you can do it. ADVANTAGES OF QUITTING SMOKING  You will live longer, feel better, and live better.  Your body will feel the impact of quitting smoking almost immediately.  Within 20 minutes, blood pressure decreases. Your pulse returns to its normal level.  After 8 hours, carbon monoxide levels in the blood return to normal. Your oxygen level increases.  After 24 hours, the chance of having a heart attack starts to decrease. Your breath, hair, and body stop smelling like smoke.  After 48 hours, damaged nerve endings begin to recover. Your sense of taste and smell improve.  After 72 hours, the body is virtually free of nicotine. Your bronchial tubes relax and breathing becomes easier.  After 2 to 12 weeks, lungs can hold more air. Exercise becomes easier and circulation improves.  The risk of having a heart attack, stroke, cancer, or lung disease is greatly reduced.  After 1 year, the risk of coronary heart disease is cut in half.  After 5 years, the risk of stroke falls to the same as a nonsmoker.  After 10 years, the risk of lung cancer is cut in half and the risk of other cancers decreases significantly.  After 15 years, the risk of coronary heart disease drops, usually to the level of a nonsmoker.  If you are pregnant, quitting smoking will improve your chances of having a healthy baby.  The people you live with, especially any children, will be healthier.  You will have extra money to spend on things other than cigarettes. QUESTIONS TO THINK ABOUT BEFORE ATTEMPTING TO QUIT You may want to talk about your answers with your  caregiver.  Why do you want to quit?  If you tried to quit in the past, what helped and what did not?  What will be the most difficult situations for you after you quit? How will you plan to handle them?  Who can help you through the tough times? Your family? Friends? A caregiver?  What pleasures do you get from smoking? What ways can you still get pleasure if you quit? Here are some questions to ask your caregiver:  How can you help me to be successful at quitting?  What medicine do you think would be best for me and how should I take it?  What should I do if I need more help?  What is smoking withdrawal like? How can I get information on withdrawal? GET READY  Set a quit date.  Change your environment by getting rid of all cigarettes, ashtrays, matches, and lighters in your home, car, or work. Do not let people smoke in your home.  Review your past attempts to quit. Think about what worked and what did not. GET SUPPORT AND ENCOURAGEMENT You have a better chance of being successful if you have help. You can get support in many ways.  Tell your family, friends, and co-workers that you are going to quit and need their support. Ask them not to smoke around you.  Get individual, group, or telephone counseling and support. Programs are available at local hospitals and health centers. Call your local health department for   information about programs in your area.  Spiritual beliefs and practices may help some smokers quit.  Download a "quit meter" on your computer to keep track of quit statistics, such as how long you have gone without smoking, cigarettes not smoked, and money saved.  Get a self-help book about quitting smoking and staying off of tobacco. LEARN NEW SKILLS AND BEHAVIORS  Distract yourself from urges to smoke. Talk to someone, go for a walk, or occupy your time with a task.  Change your normal routine. Take a different route to work. Drink tea instead of coffee.  Eat breakfast in a different place.  Reduce your stress. Take a hot bath, exercise, or read a book.  Plan something enjoyable to do every day. Reward yourself for not smoking.  Explore interactive web-based programs that specialize in helping you quit. GET MEDICINE AND USE IT CORRECTLY Medicines can help you stop smoking and decrease the urge to smoke. Combining medicine with the above behavioral methods and support can greatly increase your chances of successfully quitting smoking.  Nicotine replacement therapy helps deliver nicotine to your body without the negative effects and risks of smoking. Nicotine replacement therapy includes nicotine gum, lozenges, inhalers, nasal sprays, and skin patches. Some may be available over-the-counter and others require a prescription.  Antidepressant medicine helps people abstain from smoking, but how this works is unknown. This medicine is available by prescription.  Nicotinic receptor partial agonist medicine simulates the effect of nicotine in your brain. This medicine is available by prescription. Ask your caregiver for advice about which medicines to use and how to use them based on your health history. Your caregiver will tell you what side effects to look out for if you choose to be on a medicine or therapy. Carefully read the information on the package. Do not use any other product containing nicotine while using a nicotine replacement product.  RELAPSE OR DIFFICULT SITUATIONS Most relapses occur within the first 3 months after quitting. Do not be discouraged if you start smoking again. Remember, most people try several times before finally quitting. You may have symptoms of withdrawal because your body is used to nicotine. You may crave cigarettes, be irritable, feel very hungry, cough often, get headaches, or have difficulty concentrating. The withdrawal symptoms are only temporary. They are strongest when you first quit, but they will go away within  10 14 days. To reduce the chances of relapse, try to:  Avoid drinking alcohol. Drinking lowers your chances of successfully quitting.  Reduce the amount of caffeine you consume. Once you quit smoking, the amount of caffeine in your body increases and can give you symptoms, such as a rapid heartbeat, sweating, and anxiety.  Avoid smokers because they can make you want to smoke.  Do not let weight gain distract you. Many smokers will gain weight when they quit, usually less than 10 pounds. Eat a healthy diet and stay active. You can always lose the weight gained after you quit.  Find ways to improve your mood other than smoking. FOR MORE INFORMATION  www.smokefree.gov  Document Released: 05/27/2001 Document Revised: 12/02/2011 Document Reviewed: 09/11/2011 Exit[employer] Patient Information 2014 [employer].  

## 2013-01-20 NOTE — Assessment & Plan Note (Signed)
Refill xanax

## 2013-01-20 NOTE — Progress Notes (Signed)
  Subjective:    Patient ID: Amy Wiley, female    DOB: 1970/11/21, 42 y.o.   MRN: [id number]  HPI Pt here for med refills.  She also is requesting to increase the wellbutrin dose .   She is also requesting Inc # of pain pills. ADD is well controlled with medication.    Review of Systems As above     Objective:   Physical Exam BP 114/78  Pulse 108  Temp(Src) 98.7 F (37.1 C) (Oral)  Wt 112 lb 3.2 oz (50.894 kg)  BMI 21.21 kg/m2  SpO2 97% General appearance: alert, cooperative, appears stated age and no distress Neck: no adenopathy, no carotid bruit, no JVD, supple, symmetrical, trachea midline and thyroid not enlarged, symmetric, no tenderness/mass/nodules Lungs: clear to auscultation bilaterally Heart: S1, S2 normal Extremities: extremities normal, atraumatic, no cyanosis or edema        Assessment & Plan:

## 2013-01-20 NOTE — Assessment & Plan Note (Signed)
Increase wellbutrin xl 300 mg daily Smoking cessation HO given to pt

## 2013-02-08 ENCOUNTER — Ambulatory Visit (INDEPENDENT_AMBULATORY_CARE_PROVIDER_SITE_OTHER): Payer: BC Managed Care – PPO | Admitting: Family Medicine

## 2013-02-08 ENCOUNTER — Encounter: Payer: Self-pay | Admitting: Family Medicine

## 2013-02-08 VITALS — BP 110/70 | HR 79 | Temp 98.3°F | Ht 61.0 in | Wt 114.2 lb

## 2013-02-08 DIAGNOSIS — Z0000 Encounter for general adult medical examination without abnormal findings: Secondary | ICD-10-CM

## 2013-02-08 DIAGNOSIS — M549 Dorsalgia, unspecified: Secondary | ICD-10-CM

## 2013-02-08 DIAGNOSIS — N946 Dysmenorrhea, unspecified: Secondary | ICD-10-CM

## 2013-02-08 DIAGNOSIS — G8929 Other chronic pain: Secondary | ICD-10-CM

## 2013-02-08 DIAGNOSIS — F17200 Nicotine dependence, unspecified, uncomplicated: Secondary | ICD-10-CM

## 2013-02-08 DIAGNOSIS — F411 Generalized anxiety disorder: Secondary | ICD-10-CM

## 2013-02-08 DIAGNOSIS — Z23 Encounter for immunization: Secondary | ICD-10-CM

## 2013-02-08 LAB — POCT URINALYSIS DIPSTICK
Bilirubin, UA: NEGATIVE
Glucose, UA: NEGATIVE
Leukocytes, UA: NEGATIVE
Nitrite, UA: NEGATIVE
pH, UA: 6.5

## 2013-02-08 LAB — HEPATIC FUNCTION PANEL
ALT: 23 U/L (ref 0–35)
AST: 17 U/L (ref 0–37)
Albumin: 4.2 g/dL (ref 3.5–5.2)
Alkaline Phosphatase: 43 U/L (ref 39–117)
Bilirubin, Direct: 0 mg/dL (ref 0.0–0.3)
Total Protein: 7.2 g/dL (ref 6.0–8.3)

## 2013-02-08 LAB — CBC WITH DIFFERENTIAL/PLATELET
Basophils Relative: 0.3 % (ref 0.0–3.0)
Eosinophils Relative: 0.9 % (ref 0.0–5.0)
Hemoglobin: 12.6 g/dL (ref 12.0–15.0)
Lymphocytes Relative: 20.5 % (ref 12.0–46.0)
Neutro Abs: 5.8 10*3/uL (ref 1.4–7.7)
Neutrophils Relative %: 70.9 % (ref 43.0–77.0)
RBC: 4.16 Mil/uL (ref 3.87–5.11)
WBC: 8.2 10*3/uL (ref 4.5–10.5)

## 2013-02-08 LAB — BASIC METABOLIC PANEL
CO2: 29 mEq/L (ref 19–32)
Calcium: 8.9 mg/dL (ref 8.4–10.5)
Chloride: 104 mEq/L (ref 96–112)
Creatinine, Ser: 0.7 mg/dL (ref 0.4–1.2)
Sodium: 136 mEq/L (ref 135–145)

## 2013-02-08 LAB — LIPID PANEL: Total CHOL/HDL Ratio: 3

## 2013-02-08 LAB — TSH: TSH: 0.77 u[IU]/mL (ref 0.35–5.50)

## 2013-02-08 MED ORDER — HYDROCODONE-ACETAMINOPHEN 10-325 MG PO TABS: 1.0000 | ORAL_TABLET | Freq: Four times a day (QID) | ORAL | Status: DC | PRN

## 2013-02-08 MED ORDER — ALPRAZOLAM 0.5 MG PO TABS: ORAL_TABLET | ORAL | Status: DC

## 2013-02-08 NOTE — Assessment & Plan Note (Signed)
Well controlled. Refill meds.

## 2013-02-08 NOTE — Progress Notes (Signed)
Subjective:     Amy Wiley is a 42 y.o. female and is here for a comprehensive physical exam. The patient reports no problems.  History   Social History  . Marital Status: Married    Spouse Name: N/A    Number of Children: N/A  . Years of Education: N/A   Occupational History  . [employer]     cpa   Social History Main Topics  . Smoking status: Current Every Day Smoker    Types: Cigarettes  . Smokeless tobacco: Never Used     Comment: 1-2 cig a day  . Alcohol Use: No  . Drug Use: No  . Sexual Activity: Yes    Partners: Male    Birth Control/ Protection: IUD     Comment: Mirena    Other Topics Concern  . Not on file   Social History Narrative   Exercise-- walks dog qd -- 2 miles   Health Maintenance  Topic Date Due  . Tetanus/tdap  03/20/1989  . Influenza Vaccine  01/14/2013  . Mammogram  08/24/2013  . Pap Smear  02/12/2015    The following portions of the patient's history were reviewed and updated as appropriate:  She  has a past medical history of Anxiety; ADD (attention deficit disorder); and Abnormal Pap smear. She  does not have any pertinent problems on file. She  has past surgical history that includes Wisdom tooth extraction. Her family history includes ALS in her paternal grandmother; Heart attack in her paternal grandmother and paternal uncle; Heart disease in her paternal grandfather and paternal grandmother; Heart disease (age of onset: 49) in her paternal uncle; Hypertension in her father. She  reports that she has been smoking Cigarettes.  She has been smoking about 0.00 packs per day. She has never used smokeless tobacco. She reports that she does not drink alcohol or use illicit drugs. She has a current medication list which includes the following prescription(s): alprazolam, amphetamine-dextroamphetamine, bupropion, hydrocodone-acetaminophen, and levonorgestrel. Current Outpatient Prescriptions on File Prior to Visit  Medication  Sig Dispense Refill  . ALPRAZolam (XANAX) 0.5 MG tablet TAKE 1 TABLET BY MOUTH 3 TIMES A DAY AS NEEDED  30 tablet  0  . amphetamine-dextroamphetamine (ADDERALL) 30 MG tablet Take 1 tablet (30 mg total) by mouth 2 (two) times daily.  60 tablet  0  . buPROPion (WELLBUTRIN XL) 300 MG 24 hr tablet Take 1 tablet (300 mg total) by mouth daily.  90 tablet  3  . HYDROcodone-acetaminophen (NORCO) 10-325 MG per tablet Take 1 tablet by mouth every 6 (six) hours as needed for pain.  45 tablet  0  . levonorgestrel (MIRENA) 20 MCG/24HR IUD 1 each by Intrauterine route once.         No current facility-administered medications on file prior to visit.   She is allergic to varenicline tartrate..  Review of Systems Review of Systems  Constitutional: Negative for activity change, appetite change and fatigue.  HENT: Negative for hearing loss, congestion, tinnitus and ear discharge.  dentist q71m Eyes: Negative for visual disturbance (see optho q1y -- vision corrected to 20/20 with glasses).  Respiratory: Negative for cough, chest tightness and shortness of breath.   Cardiovascular: Negative for chest pain, palpitations and leg swelling.  Gastrointestinal: Negative for abdominal pain, diarrhea, constipation and abdominal distention.  Genitourinary: Negative for urgency, frequency, decreased urine volume and difficulty urinating.  Musculoskeletal: Negative for back pain, arthralgias and gait problem.  Skin: Negative for color change, pallor and  rash.  Neurological: Negative for dizziness, light-headedness, numbness and headaches.  Hematological: Negative for adenopathy. Does not bruise/bleed easily.  Psychiatric/Behavioral: Negative for suicidal ideas, confusion, sleep disturbance, self-injury, dysphoric mood, decreased concentration and agitation.       Objective:    BP 110/70  Pulse 79  Temp(Src) 98.3 F (36.8 C) (Oral)  Ht 5\' 1"  (1.549 m)  Wt 114 lb 3.2 oz (51.801 kg)  BMI 21.59 kg/m2  SpO2  95% General appearance: alert, cooperative, appears stated age and no distress Head: Normocephalic, without obvious abnormality, atraumatic Eyes: conjunctivae/corneas clear. PERRL, EOM's intact. Fundi benign. Ears: normal TM's and external ear canals both ears Nose: Nares normal. Septum midline. Mucosa normal. No drainage or sinus tenderness. Throat: lips, mucosa, and tongue normal; teeth and gums normal Neck: no adenopathy, no carotid bruit, no JVD, supple, symmetrical, trachea midline and thyroid not enlarged, symmetric, no tenderness/mass/nodules Back: symmetric, no curvature. ROM normal. No CVA tenderness. Lungs: clear to auscultation bilaterally Breasts: gyn Heart: regular rate and rhythm, S1, S2 normal, no murmur, click, rub or gallop Abdomen: soft, non-tender; bowel sounds normal; no masses,  no organomegaly Pelvic: deferred-gyn Extremities: extremities normal, atraumatic, no cyanosis or edema Pulses: 2+ and symmetric Skin: Skin color, texture, turgor normal. No rashes or lesions Lymph nodes: Cervical, supraclavicular, and axillary nodes normal. Neurologic: Alert and oriented X 3, normal strength and tone. Normal symmetric reflexes. Normal coordination and gait Psych- no depression, no anxiety      Assessment:    Healthy female exam.      Plan:    check labs ghm utd See After Visit Summary for Counseling Recommendations

## 2013-02-08 NOTE — Patient Instructions (Addendum)
Preventive Care for Adults, Female A healthy lifestyle and preventive care can promote health and wellness. Preventive health guidelines for women include the following key practices.  A routine yearly physical is a good way to check with your caregiver about your health and preventive screening. It is a chance to share any concerns and updates on your health, and to receive a thorough exam.  Visit your dentist for a routine exam and preventive care every 6 months. Brush your teeth twice a day and floss once a day. Good oral hygiene prevents tooth decay and gum disease.  The frequency of eye exams is based on your age, health, family medical history, use of contact lenses, and other factors. Follow your caregiver's recommendations for frequency of eye exams.  Eat a healthy diet. Foods like vegetables, fruits, whole grains, low-fat dairy products, and lean protein foods contain the nutrients you need without too many calories. Decrease your intake of foods high in solid fats, added sugars, and salt. Eat the right amount of calories for you.Get information about a proper diet from your caregiver, if necessary.  Regular physical exercise is one of the most important things you can do for your health. Most adults should get at least 150 minutes of moderate-intensity exercise (any activity that increases your heart rate and causes you to sweat) each week. In addition, most adults need muscle-strengthening exercises on 2 or more days a week.  Maintain a healthy weight. The body mass index (BMI) is a screening tool to identify possible weight problems. It provides an estimate of body fat based on height and weight. Your caregiver can help determine your BMI, and can help you achieve or maintain a healthy weight.For adults 20 years and older:  A BMI below 18.5 is considered underweight.  A BMI of 18.5 to 24.9 is normal.  A BMI of 25 to 29.9 is considered overweight.  A BMI of 30 and above is  considered obese.  Maintain normal blood lipids and cholesterol levels by exercising and minimizing your intake of saturated fat. Eat a balanced diet with plenty of fruit and vegetables. Blood tests for lipids and cholesterol should begin at age 20 and be repeated every 5 years. If your lipid or cholesterol levels are high, you are over 50, or you are at high risk for heart disease, you may need your cholesterol levels checked more frequently.Ongoing high lipid and cholesterol levels should be treated with medicines if diet and exercise are not effective.  If you smoke, find out from your caregiver how to quit. If you do not use tobacco, do not start.  If you are pregnant, do not drink alcohol. If you are breastfeeding, be very cautious about drinking alcohol. If you are not pregnant and choose to drink alcohol, do not exceed 1 drink per day. One drink is considered to be 12 ounces (355 mL) of beer, 5 ounces (148 mL) of wine, or 1.5 ounces (44 mL) of liquor.  Avoid use of street drugs. Do not share needles with anyone. Ask for help if you need support or instructions about stopping the use of drugs.  High blood pressure causes heart disease and increases the risk of stroke. Your blood pressure should be checked at least every 1 to 2 years. Ongoing high blood pressure should be treated with medicines if weight loss and exercise are not effective.  If you are 55 to 43 years old, ask your caregiver if you should take aspirin to prevent strokes.  Diabetes   screening involves taking a blood sample to check your fasting blood sugar level. This should be done once every 3 years, after age 45, if you are within normal weight and without risk factors for diabetes. Testing should be considered at a younger age or be carried out more frequently if you are overweight and have at least 1 risk factor for diabetes.  Breast cancer screening is essential preventive care for women. You should practice "breast  self-awareness." This means understanding the normal appearance and feel of your breasts and may include breast self-examination. Any changes detected, no matter how small, should be reported to a caregiver. Women in their 20s and 30s should have a clinical breast exam (CBE) by a caregiver as part of a regular health exam every 1 to 3 years. After age 40, women should have a CBE every year. Starting at age 40, women should consider having a mammography (breast X-ray test) every year. Women who have a family history of breast cancer should talk to their caregiver about genetic screening. Women at a high risk of breast cancer should talk to their caregivers about having magnetic resonance imaging (MRI) and a mammography every year.  The Pap test is a screening test for cervical cancer. A Pap test can show cell changes on the cervix that might become cervical cancer if left untreated. A Pap test is a procedure in which cells are obtained and examined from the lower end of the uterus (cervix).  Women should have a Pap test starting at age 21.  Between ages 21 and 29, Pap tests should be repeated every 2 years.  Beginning at age 30, you should have a Pap test every 3 years as long as the past 3 Pap tests have been normal.  Some women have medical problems that increase the chance of getting cervical cancer. Talk to your caregiver about these problems. It is especially important to talk to your caregiver if a new problem develops soon after your last Pap test. In these cases, your caregiver may recommend more frequent screening and Pap tests.  The above recommendations are the same for women who have or have not gotten the vaccine for human papillomavirus (HPV).  If you had a hysterectomy for a problem that was not cancer or a condition that could lead to cancer, then you no longer need Pap tests. Even if you no longer need a Pap test, a regular exam is a good idea to make sure no other problems are  starting.  If you are between ages 65 and 70, and you have had normal Pap tests going back 10 years, you no longer need Pap tests. Even if you no longer need a Pap test, a regular exam is a good idea to make sure no other problems are starting.  If you have had past treatment for cervical cancer or a condition that could lead to cancer, you need Pap tests and screening for cancer for at least 20 years after your treatment.  If Pap tests have been discontinued, risk factors (such as a new sexual partner) need to be reassessed to determine if screening should be resumed.  The HPV test is an additional test that may be used for cervical cancer screening. The HPV test looks for the virus that can cause the cell changes on the cervix. The cells collected during the Pap test can be tested for HPV. The HPV test could be used to screen women aged 30 years and older, and should   be used in women of any age who have unclear Pap test results. After the age of 30, women should have HPV testing at the same frequency as a Pap test.  Colorectal cancer can be detected and often prevented. Most routine colorectal cancer screening begins at the age of 50 and continues through age 75. However, your caregiver may recommend screening at an earlier age if you have risk factors for colon cancer. On a yearly basis, your caregiver may provide home test kits to check for hidden blood in the stool. Use of a small camera at the end of a tube, to directly examine the colon (sigmoidoscopy or colonoscopy), can detect the earliest forms of colorectal cancer. Talk to your caregiver about this at age 50, when routine screening begins. Direct examination of the colon should be repeated every 5 to 10 years through age 75, unless early forms of pre-cancerous polyps or small growths are found.  Hepatitis C blood testing is recommended for all people born from 1945 through 1965 and any individual with known risks for hepatitis C.  Practice  safe sex. Use condoms and avoid high-risk sexual practices to reduce the spread of sexually transmitted infections (STIs). STIs include gonorrhea, chlamydia, syphilis, trichomonas, herpes, HPV, and human immunodeficiency virus (HIV). Herpes, HIV, and HPV are viral illnesses that have no cure. They can result in disability, cancer, and death. Sexually active women aged 25 and younger should be checked for chlamydia. Older women with new or multiple partners should also be tested for chlamydia. Testing for other STIs is recommended if you are sexually active and at increased risk.  Osteoporosis is a disease in which the bones lose minerals and strength with aging. This can result in serious bone fractures. The risk of osteoporosis can be identified using a bone density scan. Women ages 65 and over and women at risk for fractures or osteoporosis should discuss screening with their caregivers. Ask your caregiver whether you should take a calcium supplement or vitamin D to reduce the rate of osteoporosis.  Menopause can be associated with physical symptoms and risks. Hormone replacement therapy is available to decrease symptoms and risks. You should talk to your caregiver about whether hormone replacement therapy is right for you.  Use sunscreen with sun protection factor (SPF) of 30 or more. Apply sunscreen liberally and repeatedly throughout the day. You should seek shade when your shadow is shorter than you. Protect yourself by wearing long sleeves, pants, a wide-brimmed hat, and sunglasses year round, whenever you are outdoors.  Once a month, do a whole body skin exam, using a mirror to look at the skin on your back. Notify your caregiver of new moles, moles that have irregular borders, moles that are larger than a pencil eraser, or moles that have changed in shape or color.  Stay current with required immunizations.  Influenza. You need a dose every fall (or winter). The composition of the flu vaccine  changes each year, so being vaccinated once is not enough.  Pneumococcal polysaccharide. You need 1 to 2 doses if you smoke cigarettes or if you have certain chronic medical conditions. You need 1 dose at age 65 (or older) if you have never been vaccinated.  Tetanus, diphtheria, pertussis (Tdap, Td). Get 1 dose of Tdap vaccine if you are younger than age 65, are over 65 and have contact with an infant, are a healthcare worker, are pregnant, or simply want to be protected from whooping cough. After that, you need a Td   booster dose every 10 years. Consult your caregiver if you have not had at least 3 tetanus and diphtheria-containing shots sometime in your life or have a deep or dirty wound.  HPV. You need this vaccine if you are a woman age 26 or younger. The vaccine is given in 3 doses over 6 months.  Measles, mumps, rubella (MMR). You need at least 1 dose of MMR if you were born in 1957 or later. You may also need a second dose.  Meningococcal. If you are age 19 to 21 and a first-year college student living in a residence hall, or have one of several medical conditions, you need to get vaccinated against meningococcal disease. You may also need additional booster doses.  Zoster (shingles). If you are age 60 or older, you should get this vaccine.  Varicella (chickenpox). If you have never had chickenpox or you were vaccinated but received only 1 dose, talk to your caregiver to find out if you need this vaccine.  Hepatitis A. You need this vaccine if you have a specific risk factor for hepatitis A virus infection or you simply wish to be protected from this disease. The vaccine is usually given as 2 doses, 6 to 18 months apart.  Hepatitis B. You need this vaccine if you have a specific risk factor for hepatitis B virus infection or you simply wish to be protected from this disease. The vaccine is given in 3 doses, usually over 6 months. Preventive Services / Frequency Ages 19 to 39  Blood  pressure check.** / Every 1 to 2 years.  Lipid and cholesterol check.** / Every 5 years beginning at age 20.  Clinical breast exam.** / Every 3 years for women in their 20s and 30s.  Pap test.** / Every 2 years from ages 21 through 29. Every 3 years starting at age 30 through age 65 or 70 with a history of 3 consecutive normal Pap tests.  HPV screening.** / Every 3 years from ages 30 through ages 65 to 70 with a history of 3 consecutive normal Pap tests.  Hepatitis C blood test.** / For any individual with known risks for hepatitis C.  Skin self-exam. / Monthly.  Influenza immunization.** / Every year.  Pneumococcal polysaccharide immunization.** / 1 to 2 doses if you smoke cigarettes or if you have certain chronic medical conditions.  Tetanus, diphtheria, pertussis (Tdap, Td) immunization. / A one-time dose of Tdap vaccine. After that, you need a Td booster dose every 10 years.  HPV immunization. / 3 doses over 6 months, if you are 26 and younger.  Measles, mumps, rubella (MMR) immunization. / You need at least 1 dose of MMR if you were born in 1957 or later. You may also need a second dose.  Meningococcal immunization. / 1 dose if you are age 19 to 21 and a first-year college student living in a residence hall, or have one of several medical conditions, you need to get vaccinated against meningococcal disease. You may also need additional booster doses.  Varicella immunization.** / Consult your caregiver.  Hepatitis A immunization.** / Consult your caregiver. 2 doses, 6 to 18 months apart.  Hepatitis B immunization.** / Consult your caregiver. 3 doses usually over 6 months. Ages 40 to 64  Blood pressure check.** / Every 1 to 2 years.  Lipid and cholesterol check.** / Every 5 years beginning at age 20.  Clinical breast exam.** / Every year after age 40.  Mammogram.** / Every year beginning at age 40   and continuing for as long as you are in good health. Consult with your  caregiver.  Pap test.** / Every 3 years starting at age 30 through age 65 or 70 with a history of 3 consecutive normal Pap tests.  HPV screening.** / Every 3 years from ages 30 through ages 65 to 70 with a history of 3 consecutive normal Pap tests.  Fecal occult blood test (FOBT) of stool. / Every year beginning at age 50 and continuing until age 75. You may not need to do this test if you get a colonoscopy every 10 years.  Flexible sigmoidoscopy or colonoscopy.** / Every 5 years for a flexible sigmoidoscopy or every 10 years for a colonoscopy beginning at age 50 and continuing until age 75.  Hepatitis C blood test.** / For all people born from 1945 through 1965 and any individual with known risks for hepatitis C.  Skin self-exam. / Monthly.  Influenza immunization.** / Every year.  Pneumococcal polysaccharide immunization.** / 1 to 2 doses if you smoke cigarettes or if you have certain chronic medical conditions.  Tetanus, diphtheria, pertussis (Tdap, Td) immunization.** / A one-time dose of Tdap vaccine. After that, you need a Td booster dose every 10 years.  Measles, mumps, rubella (MMR) immunization. / You need at least 1 dose of MMR if you were born in 1957 or later. You may also need a second dose.  Varicella immunization.** / Consult your caregiver.  Meningococcal immunization.** / Consult your caregiver.  Hepatitis A immunization.** / Consult your caregiver. 2 doses, 6 to 18 months apart.  Hepatitis B immunization.** / Consult your caregiver. 3 doses, usually over 6 months. Ages 65 and over  Blood pressure check.** / Every 1 to 2 years.  Lipid and cholesterol check.** / Every 5 years beginning at age 20.  Clinical breast exam.** / Every year after age 40.  Mammogram.** / Every year beginning at age 40 and continuing for as long as you are in good health. Consult with your caregiver.  Pap test.** / Every 3 years starting at age 30 through age 65 or 70 with a 3  consecutive normal Pap tests. Testing can be stopped between 65 and 70 with 3 consecutive normal Pap tests and no abnormal Pap or HPV tests in the past 10 years.  HPV screening.** / Every 3 years from ages 30 through ages 65 or 70 with a history of 3 consecutive normal Pap tests. Testing can be stopped between 65 and 70 with 3 consecutive normal Pap tests and no abnormal Pap or HPV tests in the past 10 years.  Fecal occult blood test (FOBT) of stool. / Every year beginning at age 50 and continuing until age 75. You may not need to do this test if you get a colonoscopy every 10 years.  Flexible sigmoidoscopy or colonoscopy.** / Every 5 years for a flexible sigmoidoscopy or every 10 years for a colonoscopy beginning at age 50 and continuing until age 75.  Hepatitis C blood test.** / For all people born from 1945 through 1965 and any individual with known risks for hepatitis C.  Osteoporosis screening.** / A one-time screening for women ages 65 and over and women at risk for fractures or osteoporosis.  Skin self-exam. / Monthly.  Influenza immunization.** / Every year.  Pneumococcal polysaccharide immunization.** / 1 dose at age 65 (or older) if you have never been vaccinated.  Tetanus, diphtheria, pertussis (Tdap, Td) immunization. / A one-time dose of Tdap vaccine if you are over   65 and have contact with an infant, are a healthcare worker, or simply want to be protected from whooping cough. After that, you need a Td booster dose every 10 years.  Varicella immunization.** / Consult your caregiver.  Meningococcal immunization.** / Consult your caregiver.  Hepatitis A immunization.** / Consult your caregiver. 2 doses, 6 to 18 months apart.  Hepatitis B immunization.** / Check with your caregiver. 3 doses, usually over 6 months. ** Family history and personal history of risk and conditions may change your caregiver's recommendations. Document Released: 07/29/2001 Document Revised: 08/25/2011  Document Reviewed: 10/28/2010 [employer] Patient Information 2014 [employer].  

## 2013-02-08 NOTE — Assessment & Plan Note (Signed)
Refill meds stable 

## 2013-02-08 NOTE — Assessment & Plan Note (Signed)
Cont wellbutrin Pt down to 1-2 a day

## 2013-03-03 ENCOUNTER — Encounter: Payer: Self-pay | Admitting: Family Medicine

## 2013-03-03 ENCOUNTER — Other Ambulatory Visit: Payer: Self-pay | Admitting: Family Medicine

## 2013-03-23 ENCOUNTER — Telehealth: Payer: Self-pay | Admitting: *Deleted

## 2013-03-23 DIAGNOSIS — F411 Generalized anxiety disorder: Secondary | ICD-10-CM

## 2013-03-23 DIAGNOSIS — N946 Dysmenorrhea, unspecified: Secondary | ICD-10-CM

## 2013-03-23 NOTE — Telephone Encounter (Signed)
Last visit 02/08/2013, both Rx filled on 02/08/2013, UDS-02/08/2013-low risk, contract signed Please advise. SW

## 2013-03-23 NOTE — Telephone Encounter (Signed)
Ok to refill x 1  

## 2013-03-24 ENCOUNTER — Telehealth: Payer: Self-pay | Admitting: *Deleted

## 2013-03-24 MED ORDER — ALPRAZOLAM 0.5 MG PO TABS: ORAL_TABLET | ORAL | Status: DC

## 2013-03-24 MED ORDER — HYDROCODONE-ACETAMINOPHEN 10-325 MG PO TABS: 1.0000 | ORAL_TABLET | Freq: Four times a day (QID) | ORAL | Status: DC | PRN

## 2013-03-24 NOTE — Telephone Encounter (Signed)
Rx filled and patient notified that it is ready for pick up

## 2013-03-24 NOTE — Telephone Encounter (Signed)
Error

## 2013-04-20 ENCOUNTER — Telehealth: Payer: Self-pay

## 2013-04-20 DIAGNOSIS — F411 Generalized anxiety disorder: Secondary | ICD-10-CM

## 2013-04-20 DIAGNOSIS — N946 Dysmenorrhea, unspecified: Secondary | ICD-10-CM

## 2013-04-20 MED ORDER — ALPRAZOLAM 0.5 MG PO TABS: ORAL_TABLET | ORAL | Status: DC

## 2013-04-20 MED ORDER — HYDROCODONE-ACETAMINOPHEN 10-325 MG PO TABS: 1.0000 | ORAL_TABLET | Freq: Four times a day (QID) | ORAL | Status: DC | PRN

## 2013-04-20 NOTE — Telephone Encounter (Signed)
Last seen 02/08/13 and both filled 03/24/13. Please advise     KP

## 2013-04-20 NOTE — Telephone Encounter (Signed)
Refill x1 

## 2013-05-20 ENCOUNTER — Telehealth: Payer: Self-pay

## 2013-05-20 DIAGNOSIS — N946 Dysmenorrhea, unspecified: Secondary | ICD-10-CM

## 2013-05-20 DIAGNOSIS — F411 Generalized anxiety disorder: Secondary | ICD-10-CM

## 2013-05-20 MED ORDER — HYDROCODONE-ACETAMINOPHEN 10-325 MG PO TABS: 1.0000 | ORAL_TABLET | Freq: Four times a day (QID) | ORAL | Status: DC | PRN

## 2013-05-20 MED ORDER — ALPRAZOLAM 0.5 MG PO TABS: ORAL_TABLET | ORAL | Status: DC

## 2013-05-20 NOTE — Telephone Encounter (Signed)
Last seen 02/08/13 and both filled 04/20/13. #30 UDS 02/08/13 Low risk   Please advise     KP

## 2013-05-20 NOTE — Telephone Encounter (Signed)
Ok for #30 of hydrocodone and xanax, no refills

## 2013-06-02 ENCOUNTER — Telehealth: Payer: Self-pay | Admitting: *Deleted

## 2013-06-02 DIAGNOSIS — F988 Other specified behavioral and emotional disorders with onset usually occurring in childhood and adolescence: Secondary | ICD-10-CM

## 2013-06-02 MED ORDER — AMPHETAMINE-DEXTROAMPHETAMINE 30 MG PO TABS: 30.0000 mg | ORAL_TABLET | Freq: Two times a day (BID) | ORAL | Status: DC

## 2013-06-02 NOTE — Telephone Encounter (Signed)
Patient called and stated that she would like a 107-month supply of Adderall.  Last seen-02/08/2013  Last filled-01/20/2013  UDS-02/08/2013 low risk, contract signed   Please advise. SW

## 2013-06-02 NOTE — Telephone Encounter (Signed)
Med filled and patient notified.

## 2013-06-02 NOTE — Telephone Encounter (Signed)
Ok to fill 2 months

## 2013-06-21 ENCOUNTER — Telehealth: Payer: Self-pay | Admitting: *Deleted

## 2013-06-21 DIAGNOSIS — N946 Dysmenorrhea, unspecified: Secondary | ICD-10-CM

## 2013-06-21 MED ORDER — HYDROCODONE-ACETAMINOPHEN 10-325 MG PO TABS: 1.0000 | ORAL_TABLET | Freq: Four times a day (QID) | ORAL | Status: DC | PRN

## 2013-06-21 NOTE — Telephone Encounter (Signed)
Patient aware Rx ready after lunch     KP 

## 2013-06-21 NOTE — Telephone Encounter (Signed)
Patient is requesting refill on Norco. Last OV 02/08/13 UDS 02/08/13 low risk Last filled 05/20/13 #30 Patient is requesting #45 to be filled Okay to refill?

## 2013-06-21 NOTE — Telephone Encounter (Signed)
Refill x1 

## 2013-07-19 ENCOUNTER — Encounter: Payer: Self-pay | Admitting: Family Medicine

## 2013-07-19 ENCOUNTER — Ambulatory Visit (INDEPENDENT_AMBULATORY_CARE_PROVIDER_SITE_OTHER): Payer: BC Managed Care – PPO | Admitting: Family Medicine

## 2013-07-19 VITALS — BP 114/62 | HR 71 | Temp 98.6°F | Wt 115.0 lb

## 2013-07-19 DIAGNOSIS — F329 Major depressive disorder, single episode, unspecified: Secondary | ICD-10-CM

## 2013-07-19 DIAGNOSIS — F32A Depression, unspecified: Secondary | ICD-10-CM

## 2013-07-19 DIAGNOSIS — F988 Other specified behavioral and emotional disorders with onset usually occurring in childhood and adolescence: Secondary | ICD-10-CM

## 2013-07-19 DIAGNOSIS — F411 Generalized anxiety disorder: Secondary | ICD-10-CM

## 2013-07-19 DIAGNOSIS — N946 Dysmenorrhea, unspecified: Secondary | ICD-10-CM | POA: Insufficient documentation

## 2013-07-19 DIAGNOSIS — F17200 Nicotine dependence, unspecified, uncomplicated: Secondary | ICD-10-CM

## 2013-07-19 DIAGNOSIS — F3289 Other specified depressive episodes: Secondary | ICD-10-CM

## 2013-07-19 MED ORDER — AMPHETAMINE-DEXTROAMPHETAMINE 30 MG PO TABS: 30.0000 mg | ORAL_TABLET | Freq: Two times a day (BID) | ORAL | Status: DC

## 2013-07-19 MED ORDER — HYDROCODONE-ACETAMINOPHEN 10-325 MG PO TABS: 1.0000 | ORAL_TABLET | Freq: Four times a day (QID) | ORAL | Status: DC | PRN

## 2013-07-19 MED ORDER — BUPROPION HCL ER (XL) 300 MG PO TB24: 300.0000 mg | ORAL_TABLET | Freq: Every day | ORAL | Status: DC

## 2013-07-19 MED ORDER — ALPRAZOLAM 0.5 MG PO TABS: ORAL_TABLET | ORAL | Status: DC

## 2013-07-19 NOTE — Progress Notes (Signed)
Pre visit review using our clinic review tool, if applicable. No additional management support is needed unless otherwise documented below in the visit note. 

## 2013-07-19 NOTE — Assessment & Plan Note (Signed)
Pt is on wellbutrin for depression and it is helping with smoking  rto 6 months

## 2013-07-19 NOTE — Assessment & Plan Note (Signed)
Refill meds

## 2013-07-19 NOTE — Progress Notes (Signed)
   Subjective:                             Patient ID: Amy Wiley, female    DOB: 12/20/1970, 43 y.o.   MRN: [id number]  HPI Pt here f/u add , anxiety and dysmenorrhea.  Medication is doing well.  Pt is only smoking 1-2 cig--- and actually stopped from Christmas until yesterday.  She is working hard to stop completely.   Review of Systems As above    Objective:   Physical Exam BP 114/62  Pulse 71  Temp(Src) 98.6 F (37 C) (Oral)  Wt 115 lb (52.164 kg)  SpO2 98% General appearance: alert, cooperative, appears stated age and no distress Lungs: clear to auscultation bilaterally Heart: S1, S2 normal Extremities: extremities normal, atraumatic, no cyanosis or edema       Assessment & Plan:

## 2013-07-19 NOTE — Assessment & Plan Note (Signed)
Refill meds stable 

## 2013-07-19 NOTE — Patient Instructions (Signed)
Attention Deficit Hyperactivity Disorder Attention deficit hyperactivity disorder (ADHD) is a problem with behavior issues based on the way the brain functions (neurobehavioral disorder). It is a common reason for behavior and academic problems in school. SYMPTOMS  There are 3 types of ADHD. The 3 types and some of the symptoms include:  Inattentive  Gets bored or distracted easily.  Loses or forgets things. Forgets to hand in homework.  Has trouble organizing or completing tasks.  Difficulty staying on task.  An inability to organize daily tasks and school work.  Leaving projects, chores, or homework unfinished.  Trouble paying attention or responding to details. Careless mistakes.  Difficulty following directions. Often seems like is not listening.  Dislikes activities that require sustained attention (like chores or homework).  Hyperactive-impulsive  Feels like it is impossible to sit still or stay in a seat. Fidgeting with hands and feet.  Trouble waiting turn.  Talking too much or out of turn. Interruptive.  Speaks or acts impulsively.  Aggressive, disruptive behavior.  Constantly busy or on the go, noisy.  Often leaves seat when they are expected to remain seated.  Often runs or climbs where it is not appropriate, or feels very restless.  Combined  Has symptoms of both of the above. Often children with ADHD feel discouraged about themselves and with school. They often perform well below their abilities in school. As children get older, the excess motor activities can calm down, but the problems with paying attention and staying organized persist. Most children do not outgrow ADHD but with good treatment can learn to cope with the symptoms. DIAGNOSIS  When ADHD is suspected, the diagnosis should be made by professionals trained in ADHD. This professional will collect information about the individual suspected of having ADHD. Information must be collected from  various settings where the person lives, works, or attends school.  Diagnosis will include:  Confirming symptoms began in childhood.  Ruling out other reasons for the child's behavior.  The health care providers will check with the child's school and check their medical records.  They will talk to teachers and parents.  Behavior rating scales for the child will be filled out by those dealing with the child on a daily basis. A diagnosis is made only after all information has been considered. TREATMENT  Treatment usually includes behavioral treatment, tutoring or extra support in school, and stimulant medicines. Because of the way a person's brain works with ADHD, these medicines decrease impulsivity and hyperactivity and increase attention. This is different than how they would work in a person who does not have ADHD. Other medicines used include antidepressants and certain blood pressure medicines. Most experts agree that treatment for ADHD should address all aspects of the person's functioning. Along with medicines, treatment should include structured classroom management at school. Parents should reward good behavior, provide constant discipline, and limit-setting. Tutoring should be available for the child as needed. ADHD is a life-long condition. If untreated, the disorder can have long-term serious effects into adolescence and adulthood. HOME CARE INSTRUCTIONS   Often with ADHD there is a lot of frustration among family members dealing with the condition. Blame and anger are also feelings that are common. In many cases, because the problem affects the family as a whole, the entire family may need help. A therapist can help the family find better ways to handle the disruptive behaviors of the person with ADHD and promote change. If the person with ADHD is young, most of the therapist's work   is with the parents. Parents will learn techniques for coping with and improving their child's  behavior. Sometimes only the child with the ADHD needs counseling. Your health care providers can help you make these decisions.  Children with ADHD may need help learning how to organize. Some helpful tips include:  Keep routines the same every day from wake-up time to bedtime. Schedule all activities, including homework and playtime. Keep the schedule in a place where the person with ADHD will often see it. Mark schedule changes as far in advance as possible.  Schedule outdoor and indoor recreation.  Have a place for everything and keep everything in its place. This includes clothing, backpacks, and school supplies.  Encourage writing down assignments and bringing home needed books. Work with your child's teachers for assistance in organizing school work.  Offer your child a well-balanced diet. Breakfast that includes a balance of whole grains, protein and, fruits or vegetables is especially important for school performance. Children should avoid drinks with caffeine including:  Soft drinks.  Coffee.  Tea.  However, some older children (adolescents) may find these drinks helpful in improving their attention. Because it can also be common for adolescents with ADHD to become addicted to caffeine, talk with your health care provider about what is a safe amount of caffeine intake for your child.  Children with ADHD need consistent rules that they can understand and follow. If rules are followed, give small rewards. Children with ADHD often receive, and expect, criticism. Look for good behavior and praise it. Set realistic goals. Give clear instructions. Look for activities that can foster success and self-esteem. Make time for pleasant activities with your child. Give lots of affection.  Parents are their children's greatest advocates. Learn as much as possible about ADHD. This helps you become a stronger and better advocate for your child. It also helps you educate your child's teachers and  instructors if they feel inadequate in these areas. Parent support groups are often helpful. A national group with local chapters is called Children and Adults with Attention Deficit Hyperactivity Disorder (CHADD). SEEK MEDICAL CARE IF:  Your child has repeated muscle twitches, cough or speech outbursts.  Your child has sleep problems.  Your child has a marked loss of appetite.  Your child develops depression.  Your child has new or worsening behavioral problems.  Your child develops dizziness.  Your child has a racing heart.  Your child has stomach pains.  Your child develops headaches. SEEK IMMEDIATE MEDICAL CARE IF:  Your child has been diagnosed with depression or anxiety and the symptoms seem to be getting worse.  Your child has been depressed and suddenly appears to have increased energy or motivation.  You are worried that your child is having a bad reaction to a medication he or she is taking for ADHD. Document Released: 05/23/2002 Document Revised: 03/23/2013 Document Reviewed: 02/07/2013 Exit[employer] Patient Information 2014 [employer].  

## 2013-08-22 ENCOUNTER — Telehealth: Payer: Self-pay | Admitting: Family Medicine

## 2013-08-22 DIAGNOSIS — N946 Dysmenorrhea, unspecified: Secondary | ICD-10-CM

## 2013-08-22 DIAGNOSIS — F411 Generalized anxiety disorder: Secondary | ICD-10-CM

## 2013-08-22 MED ORDER — HYDROCODONE-ACETAMINOPHEN 10-325 MG PO TABS: 1.0000 | ORAL_TABLET | Freq: Four times a day (QID) | ORAL | Status: DC | PRN

## 2013-08-22 MED ORDER — ALPRAZOLAM 0.5 MG PO TABS: ORAL_TABLET | ORAL | Status: DC

## 2013-08-22 NOTE — Telephone Encounter (Signed)
Last seen and filled 07/19/13. Please advise   KP

## 2013-08-22 NOTE — Telephone Encounter (Signed)
Patient called and stated that she left a message on the triage line on Friday and this morning. She would like a refill on ALPRAZolam (XANAX) 0.5 MG tablet And HYDROcodone-acetaminophen (NORCO) 10-325 MG per tablet. Please advise

## 2013-08-22 NOTE — Telephone Encounter (Signed)
Refill x1 

## 2013-08-22 NOTE — Telephone Encounter (Signed)
Patient aware Rx ready for pick up tomorrow.    KP 

## 2013-09-20 ENCOUNTER — Telehealth: Payer: Self-pay | Admitting: Family Medicine

## 2013-09-20 DIAGNOSIS — N946 Dysmenorrhea, unspecified: Secondary | ICD-10-CM

## 2013-09-20 DIAGNOSIS — F411 Generalized anxiety disorder: Secondary | ICD-10-CM

## 2013-09-20 MED ORDER — ALPRAZOLAM 0.5 MG PO TABS: ORAL_TABLET | ORAL | Status: DC

## 2013-09-20 MED ORDER — HYDROCODONE-ACETAMINOPHEN 10-325 MG PO TABS: 1.0000 | ORAL_TABLET | Freq: Four times a day (QID) | ORAL | Status: DC | PRN

## 2013-09-20 NOTE — Telephone Encounter (Signed)
Refill each x1 

## 2013-09-20 NOTE — Telephone Encounter (Signed)
Patient aware Rx ready for pick up.      KP 

## 2013-09-20 NOTE — Telephone Encounter (Signed)
Patient called to request refills for ALPRAZolam (XANAX) 0.5 MG tablet and HYDROcodone-acetaminophen (NORCO) 10-325 MG per tablet

## 2013-09-20 NOTE — Telephone Encounter (Signed)
Last seen 07/19/13 and filled 08/22/13 UDS 02/08/13  Please advise      KP

## 2013-10-19 ENCOUNTER — Telehealth: Payer: Self-pay | Admitting: *Deleted

## 2013-10-19 DIAGNOSIS — F988 Other specified behavioral and emotional disorders with onset usually occurring in childhood and adolescence: Secondary | ICD-10-CM

## 2013-10-19 DIAGNOSIS — F411 Generalized anxiety disorder: Secondary | ICD-10-CM

## 2013-10-19 DIAGNOSIS — N946 Dysmenorrhea, unspecified: Secondary | ICD-10-CM

## 2013-10-19 NOTE — Telephone Encounter (Signed)
Caller name:  Joni Reiningicole Relation to pt:  self Call back number:  [phone] [facility]  Reason for call:   Pt called to request refills on the following:  amphetamine-dextroamphetamine (ADDERALL) 30 MG tablet  Last filled 07/19/2013, #60, 3 months Last OV 07/19/2013  HYDROcodone-acetaminophen (NORCO) 10-325 MG per tablet  Last filled 09/20/2013, #45, no refills  ALPRAZolam (XANAX) 0.5 MG tablet  Last filled 09/20/2013, #30, no refills

## 2013-10-19 NOTE — Telephone Encounter (Signed)
Last seen 07/19/13 and filled 09/20/13  UDS 02/08/13    Please advise KP

## 2013-10-19 NOTE — Telephone Encounter (Signed)
Ok to refill x 1  

## 2013-10-20 MED ORDER — HYDROCODONE-ACETAMINOPHEN 10-325 MG PO TABS: 1.0000 | ORAL_TABLET | Freq: Four times a day (QID) | ORAL | Status: DC | PRN

## 2013-10-20 MED ORDER — AMPHETAMINE-DEXTROAMPHETAMINE 30 MG PO TABS: 30.0000 mg | ORAL_TABLET | Freq: Two times a day (BID) | ORAL | Status: DC

## 2013-10-20 MED ORDER — ALPRAZOLAM 0.5 MG PO TABS: ORAL_TABLET | ORAL | Status: DC

## 2013-10-20 NOTE — Telephone Encounter (Signed)
Patient aware Rx ready for pick up.      KP 

## 2013-11-18 ENCOUNTER — Telehealth: Payer: Self-pay | Admitting: Family Medicine

## 2013-11-18 DIAGNOSIS — N946 Dysmenorrhea, unspecified: Secondary | ICD-10-CM

## 2013-11-18 DIAGNOSIS — F411 Generalized anxiety disorder: Secondary | ICD-10-CM

## 2013-11-18 MED ORDER — ALPRAZOLAM 0.5 MG PO TABS: ORAL_TABLET | ORAL | Status: DC

## 2013-11-18 MED ORDER — HYDROCODONE-ACETAMINOPHEN 10-325 MG PO TABS: 1.0000 | ORAL_TABLET | Freq: Four times a day (QID) | ORAL | Status: DC | PRN

## 2013-11-18 NOTE — Telephone Encounter (Signed)
VM left advising Rx ready for pick up.     KP 

## 2013-11-18 NOTE — Telephone Encounter (Signed)
Ok for refill on each 

## 2013-11-18 NOTE — Telephone Encounter (Signed)
Last seen 07/19/13 and filled 10/20/13 hydrocodone #45 and Xanax #30.  UDS 02/08/13  Please advise     KP

## 2013-11-18 NOTE — Telephone Encounter (Signed)
Caller name: Stephenia Yoffe Relation to pt: patient Call back number: [phone] Pharmacy:  Reason for call: patient called to request refills for hydrocodone and xanax

## 2013-12-19 ENCOUNTER — Telehealth: Payer: Self-pay | Admitting: *Deleted

## 2013-12-19 DIAGNOSIS — N946 Dysmenorrhea, unspecified: Secondary | ICD-10-CM

## 2013-12-19 DIAGNOSIS — F411 Generalized anxiety disorder: Secondary | ICD-10-CM

## 2013-12-19 MED ORDER — ALPRAZOLAM 0.5 MG PO TABS: ORAL_TABLET | ORAL | Status: DC

## 2013-12-19 MED ORDER — HYDROCODONE-ACETAMINOPHEN 10-325 MG PO TABS: 1.0000 | ORAL_TABLET | Freq: Four times a day (QID) | ORAL | Status: DC | PRN

## 2013-12-19 NOTE — Telephone Encounter (Signed)
Caller name:  Joni Reiningicole Relation to pt:  self Call back number: [phone]  [facility] [address]  Reason for call:   Pt requesting refills on  ALPRAZolam Prudy Feeler(XANAX) 0.5 MG tablet  Last filled 11/18/13, #30, no refills  HYDROcodone-acetaminophen (NORCO) 10-325 MG per tablet Last filled 11/18/13, #45, no refills  Last OV  07/19/13

## 2013-12-19 NOTE — Telephone Encounter (Signed)
Patient aware Med's ready for pick up after lunch.      KP

## 2013-12-19 NOTE — Telephone Encounter (Signed)
Last seen 07/19/13 and filled 11/18/13  Xanax #30 Hydrocodone # 45  UDS 02/08/13  Please advise Amy Wiley([facility] patient)    KP

## 2013-12-19 NOTE — Telephone Encounter (Signed)
Ok for both as previously written

## 2014-01-17 ENCOUNTER — Ambulatory Visit (INDEPENDENT_AMBULATORY_CARE_PROVIDER_SITE_OTHER): Payer: BC Managed Care – PPO | Admitting: Family Medicine

## 2014-01-17 ENCOUNTER — Encounter: Payer: Self-pay | Admitting: Family Medicine

## 2014-01-17 VITALS — BP 100/62 | HR 73 | Temp 98.4°F | Wt 112.0 lb

## 2014-01-17 DIAGNOSIS — N946 Dysmenorrhea, unspecified: Secondary | ICD-10-CM

## 2014-01-17 DIAGNOSIS — F988 Other specified behavioral and emotional disorders with onset usually occurring in childhood and adolescence: Secondary | ICD-10-CM

## 2014-01-17 DIAGNOSIS — F411 Generalized anxiety disorder: Secondary | ICD-10-CM

## 2014-01-17 MED ORDER — HYDROCODONE-ACETAMINOPHEN 10-325 MG PO TABS: 1.0000 | ORAL_TABLET | Freq: Four times a day (QID) | ORAL | Status: DC | PRN

## 2014-01-17 MED ORDER — AMPHETAMINE-DEXTROAMPHETAMINE 30 MG PO TABS: 30.0000 mg | ORAL_TABLET | Freq: Two times a day (BID) | ORAL | Status: DC

## 2014-01-17 MED ORDER — ALPRAZOLAM 0.5 MG PO TABS: ORAL_TABLET | ORAL | Status: DC

## 2014-01-17 NOTE — Progress Notes (Signed)
   Subjective:    Patient ID: Amy Wiley, female    DOB: 03/16/1971, 43 y.o.   MRN: [id number]  HPI Pt here for f/u add and anxiety.  dysmennorrhea controlled with pain meds.    Review of Systems As above     Objective:   Physical Exam BP 100/62  Pulse 73  Temp(Src) 98.4 F (36.9 C) (Oral)  Wt 112 lb (50.803 kg)  SpO2 98% General appearance: alert, cooperative, appears stated age and no distress Lungs: clear to auscultation bilaterally Heart: S1, S2 normal Neurologic: Alert and oriented X 3, normal strength and tone. Normal symmetric reflexes. Normal coordination and gait       Assessment & Plan:  1. ADD (attention deficit disorder) Controlled-- con't meds - amphetamine-dextroamphetamine (ADDERALL) 30 MG tablet; Take 1 tablet (30 mg total) by mouth 2 (two) times daily.  Dispense: 60 tablet; Refill: 0 - amphetamine-dextroamphetamine (ADDERALL) 30 MG tablet; Take 1 tablet (30 mg total) by mouth 2 (two) times daily.  Dispense: 60 tablet; Refill: 0 - amphetamine-dextroamphetamine (ADDERALL) 30 MG tablet; Take 1 tablet (30 mg total) by mouth 2 (two) times daily.  Dispense: 60 tablet; Refill: 0  2. Anxiety state, unspecified stable - ALPRAZolam (XANAX) 0.5 MG tablet; TAKE 1 TABLET BY MOUTH 3 TIMES A DAY AS NEEDED  Dispense: 30 tablet; Refill: 0  3. Dysmenorrhea  - HYDROcodone-acetaminophen (NORCO) 10-325 MG per tablet; Take 1 tablet by mouth every 6 (six) hours as needed.  Dispense: 45 tablet; Refill: 0

## 2014-01-17 NOTE — Progress Notes (Signed)
Pre visit review using our clinic review tool, if applicable. No additional management support is needed unless otherwise documented below in the visit note. 

## 2014-01-17 NOTE — Patient Instructions (Signed)

## 2014-02-13 ENCOUNTER — Telehealth: Payer: Self-pay

## 2014-02-13 DIAGNOSIS — N946 Dysmenorrhea, unspecified: Secondary | ICD-10-CM

## 2014-02-13 DIAGNOSIS — F411 Generalized anxiety disorder: Secondary | ICD-10-CM

## 2014-02-13 NOTE — Telephone Encounter (Signed)
Amy Wiley [phone]  Amy Wiley called to see if she could get her prescriptions HYDROcodone-acetaminophen (NORCO) 10-325 MG per tablet / ALPRAZolam (XANAX) 0.5 MG tablet. She said she knows it is a couple of days early but she is leaving tomorrow to go out of town for a 11/2 weeks. Call when ready

## 2014-02-13 NOTE — Telephone Encounter (Signed)
Patient aware Rx ready for pick up.      KP 

## 2014-02-13 NOTE — Telephone Encounter (Signed)
Ok to refill both?? 

## 2014-02-13 NOTE — Telephone Encounter (Signed)
Last seen and filled 01/17/14 both.   Please advise      KP

## 2014-02-14 MED ORDER — ALPRAZOLAM 0.5 MG PO TABS: ORAL_TABLET | ORAL | Status: DC

## 2014-02-14 MED ORDER — HYDROCODONE-ACETAMINOPHEN 10-325 MG PO TABS: 1.0000 | ORAL_TABLET | Freq: Four times a day (QID) | ORAL | Status: DC | PRN

## 2014-02-14 NOTE — Addendum Note (Signed)
Addended by: Arnette Norris on: 02/14/2014 08:48 AM   Modules accepted: Orders

## 2014-03-21 ENCOUNTER — Other Ambulatory Visit: Payer: Self-pay | Admitting: Family Medicine

## 2014-03-21 DIAGNOSIS — N946 Dysmenorrhea, unspecified: Secondary | ICD-10-CM

## 2014-03-21 DIAGNOSIS — F411 Generalized anxiety disorder: Secondary | ICD-10-CM

## 2014-03-21 MED ORDER — HYDROCODONE-ACETAMINOPHEN 10-325 MG PO TABS: 1.0000 | ORAL_TABLET | Freq: Four times a day (QID) | ORAL | Status: DC | PRN

## 2014-03-21 MED ORDER — ALPRAZOLAM 0.5 MG PO TABS: ORAL_TABLET | ORAL | Status: DC

## 2014-03-21 NOTE — Telephone Encounter (Signed)
Last seen 01/17/14 and filled   Xanax filled 02/14/14 #30 Hydrocodone filled 02/14/14#45 UDS 02/08/13 Low risk  Please advise     KP

## 2014-03-21 NOTE — Telephone Encounter (Signed)
Caller name: Kersti Relation to pt: Call back number:[phone] Pharmacy:  Reason for call:  Requesting refill on rx HYDROcodone-acetaminophen (NORCO) 10-325 MG per tablet  ALPRAZolam (XANAX) 0.5 MG

## 2014-03-22 ENCOUNTER — Other Ambulatory Visit: Payer: Self-pay

## 2014-03-22 DIAGNOSIS — F411 Generalized anxiety disorder: Secondary | ICD-10-CM

## 2014-03-22 DIAGNOSIS — N946 Dysmenorrhea, unspecified: Secondary | ICD-10-CM

## 2014-03-22 MED ORDER — HYDROCODONE-ACETAMINOPHEN 10-325 MG PO TABS: 1.0000 | ORAL_TABLET | Freq: Four times a day (QID) | ORAL | Status: DC | PRN

## 2014-03-22 MED ORDER — ALPRAZOLAM 0.5 MG PO TABS: ORAL_TABLET | ORAL | Status: DC

## 2014-03-22 NOTE — Telephone Encounter (Signed)
Rx reprinted.    KP

## 2014-04-17 ENCOUNTER — Encounter: Payer: Self-pay | Admitting: Family Medicine

## 2014-04-18 ENCOUNTER — Other Ambulatory Visit: Payer: Self-pay | Admitting: Family Medicine

## 2014-04-18 DIAGNOSIS — F411 Generalized anxiety disorder: Secondary | ICD-10-CM

## 2014-04-18 DIAGNOSIS — F988 Other specified behavioral and emotional disorders with onset usually occurring in childhood and adolescence: Secondary | ICD-10-CM

## 2014-04-18 DIAGNOSIS — N946 Dysmenorrhea, unspecified: Secondary | ICD-10-CM

## 2014-04-18 MED ORDER — ALPRAZOLAM 0.5 MG PO TABS: ORAL_TABLET | ORAL | Status: DC

## 2014-04-18 MED ORDER — AMPHETAMINE-DEXTROAMPHETAMINE 30 MG PO TABS: 30.0000 mg | ORAL_TABLET | Freq: Two times a day (BID) | ORAL | Status: DC

## 2014-04-18 MED ORDER — HYDROCODONE-ACETAMINOPHEN 10-325 MG PO TABS: 1.0000 | ORAL_TABLET | Freq: Four times a day (QID) | ORAL | Status: DC | PRN

## 2014-04-18 NOTE — Telephone Encounter (Signed)
Last seen 01/17/14 and filled 03/22/14  #30 Xanax and  #45 Hydrocodone

## 2014-04-18 NOTE — Telephone Encounter (Signed)
Caller name: Maryln GottronFrancis, Shakina Relation to pt: self Call back number: [phone]   Reason for call: pt requesting a refill    (3) amphetamine-dextroamphetamine (ADDERALL) 30 MG tablet , ALPRAZolam (XANAX) 0.5 MG tablet, HYDROcodone-acetaminophen (NORCO) 10-325 MG per tablet

## 2014-04-18 NOTE — Telephone Encounter (Signed)
Patient aware med's are ready for pick up.      KP 

## 2014-05-15 ENCOUNTER — Telehealth: Payer: Self-pay | Admitting: Family Medicine

## 2014-05-15 DIAGNOSIS — F411 Generalized anxiety disorder: Secondary | ICD-10-CM

## 2014-05-15 DIAGNOSIS — N946 Dysmenorrhea, unspecified: Secondary | ICD-10-CM

## 2014-05-15 MED ORDER — HYDROCODONE-ACETAMINOPHEN 10-325 MG PO TABS: 1.0000 | ORAL_TABLET | Freq: Four times a day (QID) | ORAL | Status: DC | PRN

## 2014-05-15 MED ORDER — ALPRAZOLAM 0.5 MG PO TABS: ORAL_TABLET | ORAL | Status: DC

## 2014-05-15 NOTE — Telephone Encounter (Signed)
patient aware, Rx ready for pick up.     KP 

## 2014-05-15 NOTE — Telephone Encounter (Signed)
Caller name: Deolinda Relation to pt: self Call back number: [phone] Pharmacy:  Reason for call:   Patient is requesting a refill of xanax and hydrocodone

## 2014-05-15 NOTE — Telephone Encounter (Signed)
Refill each x1 

## 2014-05-15 NOTE — Telephone Encounter (Signed)
Last seen 01/17/14 and both filed 04/18/14.   Please advise    KP

## 2014-06-13 ENCOUNTER — Encounter: Payer: Self-pay | Admitting: Family Medicine

## 2014-06-13 ENCOUNTER — Ambulatory Visit (INDEPENDENT_AMBULATORY_CARE_PROVIDER_SITE_OTHER): Payer: BC Managed Care – PPO | Admitting: Family Medicine

## 2014-06-13 VITALS — BP 120/74 | HR 76 | Temp 98.8°F | Wt 114.6 lb

## 2014-06-13 DIAGNOSIS — F411 Generalized anxiety disorder: Secondary | ICD-10-CM

## 2014-06-13 DIAGNOSIS — F988 Other specified behavioral and emotional disorders with onset usually occurring in childhood and adolescence: Secondary | ICD-10-CM

## 2014-06-13 DIAGNOSIS — F32A Depression, unspecified: Secondary | ICD-10-CM

## 2014-06-13 DIAGNOSIS — F909 Attention-deficit hyperactivity disorder, unspecified type: Secondary | ICD-10-CM

## 2014-06-13 DIAGNOSIS — N946 Dysmenorrhea, unspecified: Secondary | ICD-10-CM

## 2014-06-13 DIAGNOSIS — F329 Major depressive disorder, single episode, unspecified: Secondary | ICD-10-CM

## 2014-06-13 MED ORDER — ALPRAZOLAM 0.5 MG PO TABS: ORAL_TABLET | ORAL | Status: DC

## 2014-06-13 MED ORDER — AMPHETAMINE-DEXTROAMPHETAMINE 30 MG PO TABS: 30.0000 mg | ORAL_TABLET | Freq: Two times a day (BID) | ORAL | Status: DC

## 2014-06-13 MED ORDER — HYDROCODONE-ACETAMINOPHEN 10-325 MG PO TABS: 1.0000 | ORAL_TABLET | Freq: Four times a day (QID) | ORAL | Status: DC | PRN

## 2014-06-13 MED ORDER — BUPROPION HCL ER (XL) 300 MG PO TB24: 300.0000 mg | ORAL_TABLET | Freq: Every day | ORAL | Status: DC

## 2014-06-13 NOTE — Progress Notes (Signed)
   Subjective:    Patient ID: Amy Wiley, female    DOB: 02/28/1971, 43 y.o.   MRN: [id number]  HPI Pt here to f/u ADD, dysmennorhea and anxiety.   No complaints.  meds are working well.    Review of Systems  Constitutional: Negative for fever, chills, diaphoresis, activity change, appetite change, fatigue and unexpected weight change.  Respiratory: Negative for cough, choking, chest tightness and shortness of breath.   Cardiovascular: Negative for chest pain, palpitations and leg swelling.  Psychiatric/Behavioral: Negative for suicidal ideas, hallucinations, behavioral problems, confusion, sleep disturbance, dysphoric mood, decreased concentration and agitation. The patient is not nervous/anxious and is not hyperactive.        Objective:   Physical Exam  BP 120/74 mmHg  Pulse 76  Temp(Src) 98.8 F (37.1 C) (Oral)  Wt 114 lb 9.6 oz (51.982 kg)  SpO2 99% General appearance: alert, cooperative, appears stated age and no distress Nose: Nares normal. Septum midline. Mucosa normal. No drainage or sinus tenderness. Throat: lips, mucosa, and tongue normal; teeth and gums normal Neck: no adenopathy, supple, symmetrical, trachea midline and thyroid not enlarged, symmetric, no tenderness/mass/nodules Lungs: clear to auscultation bilaterally Heart: S1, S2 normal Extremities: extremities normal, atraumatic, no cyanosis or edema     Assessment & Plan:  1. ADD (attention deficit disorder) con't meds, rto 6 months - amphetamine-dextroamphetamine (ADDERALL) 30 MG tablet; Take 1 tablet by mouth 2 (two) times daily.  Dispense: 60 tablet; Refill: 0 - amphetamine-dextroamphetamine (ADDERALL) 30 MG tablet; Take 1 tablet by mouth 2 (two) times daily.  Dispense: 60 tablet; Refill: 0 - amphetamine-dextroamphetamine (ADDERALL) 30 MG tablet; Take 1 tablet by mouth 2 (two) times daily.  Dispense: 60 tablet; Refill: 0  2. Depression stable - buPROPion (WELLBUTRIN XL) 300 MG 24 hr tablet; Take 1  tablet (300 mg total) by mouth daily.  Dispense: 90 tablet; Refill: 3  3. Anxiety state Stable, con't meds - ALPRAZolam (XANAX) 0.5 MG tablet; TAKE 1 TABLET BY MOUTH 3 TIMES A DAY AS NEEDED  Dispense: 30 tablet; Refill: 2  4. Dysmenorrhea Con/t meds - HYDROcodone-acetaminophen (NORCO) 10-325 MG per tablet; Take 1 tablet by mouth every 6 (six) hours as needed.  Dispense: 45 tablet; Refill: 0

## 2014-06-13 NOTE — Progress Notes (Signed)
Pre visit review using our clinic review tool, if applicable. No additional management support is needed unless otherwise documented below in the visit note. 

## 2014-06-13 NOTE — Patient Instructions (Signed)

## 2014-06-14 ENCOUNTER — Telehealth: Payer: Self-pay | Admitting: Family Medicine

## 2014-06-14 NOTE — Telephone Encounter (Signed)
emmi emailed °

## 2014-07-12 ENCOUNTER — Telehealth: Payer: Self-pay | Admitting: Family Medicine

## 2014-07-12 DIAGNOSIS — N946 Dysmenorrhea, unspecified: Secondary | ICD-10-CM

## 2014-07-12 NOTE — Telephone Encounter (Signed)
Hydrocodone  Last seen and filled 06/13/14 #45 UDS 02/08/13 low risk  Please advise     KP

## 2014-07-12 NOTE — Telephone Encounter (Signed)
Refill x1 

## 2014-07-12 NOTE — Telephone Encounter (Signed)
Caller name:Yoneko Thelma BargeFrancis  Relationship to patient:self Can be reached:[phone] Pharmacy:   Reason for call:Request refill on hydrocodone 10-325mg 

## 2014-07-13 MED ORDER — HYDROCODONE-ACETAMINOPHEN 10-325 MG PO TABS: 1.0000 | ORAL_TABLET | Freq: Four times a day (QID) | ORAL | Status: DC | PRN

## 2014-07-13 NOTE — Telephone Encounter (Signed)
Patient aware Rx will be ready for pick up after 2 pm today.      KP

## 2014-07-24 ENCOUNTER — Telehealth: Payer: Self-pay | Admitting: Family Medicine

## 2014-07-24 NOTE — Telephone Encounter (Signed)
Spoke with patient to obtain new insurance information. Patient's new member ID number is [id number] and rx bin number is [id number]. PA initiated. Awaiting determination through [facility]. JG//CMA

## 2014-07-24 NOTE — Telephone Encounter (Signed)
Caller name: Maryln GottronFrancis, Laasia Relation to pt: self  Call back number: [phone] [facility]  [id number] [place] [address] [phone] (Phone) [phone])     Reason for call:  Pt is completely out of amphetamine-dextroamphetamine (ADDERALL) 30 MG tablet and pt found out insurance needs approval because insurance has changed.  Pt will call back with new insurance information.

## 2014-07-25 NOTE — Telephone Encounter (Signed)
Received a letter from [employer] stating brand Adderall is denied, but generic may be covered. Did PA for generic and received approval. JG//CMA

## 2014-07-26 ENCOUNTER — Telehealth: Payer: Self-pay | Admitting: Family Medicine

## 2014-07-26 NOTE — Telephone Encounter (Addendum)
Caller name:Boss, Joni ReiningNicole Relation to WU:JWJXpt:self Call back [phone] [id number] [place] - [address] [phone] (Phone) [phone])         Reason for call:  Pt requesting a refill amphetamine-dextroamphetamine (ADDERALL) 30 MG tablet. [employer] approved generic. Pt would like to know the status

## 2014-07-26 NOTE — Telephone Encounter (Signed)
error:[id number] ° °

## 2014-07-26 NOTE — Telephone Encounter (Signed)
Called patient.  She stated that she has still been unable to get her Adderall from pharmacy despite approval.  Called pharmacy and the pharm tech stated that insurance company just sent approval around 1:30 pm, medication can now be refilled.  Called patient back and made her aware.  No further needs at this time.

## 2014-08-10 ENCOUNTER — Telehealth: Payer: Self-pay | Admitting: Family Medicine

## 2014-08-10 DIAGNOSIS — N946 Dysmenorrhea, unspecified: Secondary | ICD-10-CM

## 2014-08-10 MED ORDER — HYDROCODONE-ACETAMINOPHEN 10-325 MG PO TABS: 1.0000 | ORAL_TABLET | Freq: Four times a day (QID) | ORAL | Status: DC | PRN

## 2014-08-10 NOTE — Telephone Encounter (Signed)
Caller name: Berdell Relation to pt: self Call back number: [phone] Pharmacy:  Reason for call:   Requesting hydrocodone refill

## 2014-08-10 NOTE — Telephone Encounter (Signed)
Refill x1 Need UDS

## 2014-08-10 NOTE — Telephone Encounter (Signed)
Patient aware Rx ready for pick up.      KP 

## 2014-08-10 NOTE — Telephone Encounter (Signed)
Last seen 06/13/14 and filled 07/13/14 #45 UDS 11/08/12 low risk   Please advise     KP

## 2014-09-04 ENCOUNTER — Telehealth: Payer: Self-pay | Admitting: Family Medicine

## 2014-09-04 DIAGNOSIS — N946 Dysmenorrhea, unspecified: Secondary | ICD-10-CM

## 2014-09-04 DIAGNOSIS — F411 Generalized anxiety disorder: Secondary | ICD-10-CM

## 2014-09-04 NOTE — Telephone Encounter (Signed)
Last seen 06/13/14 and filled  Hydrocodone 08/10/14 #45 Xanax 06/13/14 #30 with 2 refills.  UDS 02/08/13 low risk    Please advise      KP

## 2014-09-04 NOTE — Telephone Encounter (Signed)
Caller name: Neelah Relation to pt: self Call back number: [phone] [facility] [address]  Reason for call:   Needs xanax and hydrocodone refill. Patient will be leaving on Thursday and will be out of town for 10 days.

## 2014-09-04 NOTE — Telephone Encounter (Signed)
Ok to refill all x1--- need uds

## 2014-09-05 MED ORDER — HYDROCODONE-ACETAMINOPHEN 10-325 MG PO TABS: 1.0000 | ORAL_TABLET | Freq: Four times a day (QID) | ORAL | Status: DC | PRN

## 2014-09-05 MED ORDER — ALPRAZOLAM 0.5 MG PO TABS: ORAL_TABLET | ORAL | Status: DC

## 2014-09-05 NOTE — Telephone Encounter (Signed)
Patient aware Rx ready for pick up.      KP 

## 2014-10-09 ENCOUNTER — Telehealth: Payer: Self-pay | Admitting: Family Medicine

## 2014-10-09 DIAGNOSIS — N946 Dysmenorrhea, unspecified: Secondary | ICD-10-CM

## 2014-10-09 DIAGNOSIS — F411 Generalized anxiety disorder: Secondary | ICD-10-CM

## 2014-10-09 MED ORDER — ALPRAZOLAM 0.5 MG PO TABS: ORAL_TABLET | ORAL | Status: DC

## 2014-10-09 MED ORDER — HYDROCODONE-ACETAMINOPHEN 10-325 MG PO TABS: 1.0000 | ORAL_TABLET | Freq: Four times a day (QID) | ORAL | Status: DC | PRN

## 2014-10-09 NOTE — Telephone Encounter (Signed)
REFILL BOTH X1

## 2014-10-09 NOTE — Telephone Encounter (Signed)
Caller name: Shekela Relation to pt: self Call back number: [phone] Pharmacy:  Reason for call:   Requesting hydrocodone and xanax refill

## 2014-10-09 NOTE — Telephone Encounter (Signed)
Last seen 06/13/14 and filled 09/05/14  Hydrocodone #45 Xanax #30 UDS 02/08/13 low risk  Please advise     KP

## 2014-10-09 NOTE — Telephone Encounter (Signed)
Patient aware Rx ready for pick up.      KP 

## 2014-11-06 ENCOUNTER — Telehealth: Payer: Self-pay | Admitting: Family Medicine

## 2014-11-06 DIAGNOSIS — N946 Dysmenorrhea, unspecified: Secondary | ICD-10-CM

## 2014-11-06 DIAGNOSIS — F411 Generalized anxiety disorder: Secondary | ICD-10-CM

## 2014-11-06 MED ORDER — HYDROCODONE-ACETAMINOPHEN 10-325 MG PO TABS: 1.0000 | ORAL_TABLET | Freq: Four times a day (QID) | ORAL | Status: DC | PRN

## 2014-11-06 MED ORDER — ALPRAZOLAM 0.5 MG PO TABS: ORAL_TABLET | ORAL | Status: DC

## 2014-11-06 NOTE — Telephone Encounter (Signed)
Will allow one-month supply in PCP absence.  Since there is no UDS result in system, she will need to give sample at time of pick up per office policies.

## 2014-11-06 NOTE — Telephone Encounter (Signed)
Relation to pt: self  Call back number: [phone]   Reason for call:  Pt requesting a refill ALPRAZolam (XANAX) 0.5 MG tablet and HYDROcodone-acetaminophen (NORCO) 10-325 MG per tablet

## 2014-11-06 NOTE — Telephone Encounter (Signed)
[facility] patient  Last seen 06/13/14 and filled  Xanax 10/09/14 #30 Hydrocodone 10/09/14 #45   UDS 09/07/14 No results in the system.    Please advise    KP

## 2014-11-06 NOTE — Telephone Encounter (Signed)
Patient aware Rx ready for pick up.      KP 

## 2014-12-14 ENCOUNTER — Encounter: Payer: Self-pay | Admitting: Family Medicine

## 2014-12-14 ENCOUNTER — Ambulatory Visit (INDEPENDENT_AMBULATORY_CARE_PROVIDER_SITE_OTHER): Payer: 59 | Admitting: Family Medicine

## 2014-12-14 VITALS — BP 100/70 | HR 72 | Temp 99.0°F | Ht 61.0 in | Wt 119.2 lb

## 2014-12-14 DIAGNOSIS — N946 Dysmenorrhea, unspecified: Secondary | ICD-10-CM

## 2014-12-14 DIAGNOSIS — M549 Dorsalgia, unspecified: Secondary | ICD-10-CM | POA: Diagnosis not present

## 2014-12-14 DIAGNOSIS — F411 Generalized anxiety disorder: Secondary | ICD-10-CM

## 2014-12-14 DIAGNOSIS — M4120 Other idiopathic scoliosis, site unspecified: Secondary | ICD-10-CM

## 2014-12-14 DIAGNOSIS — G8929 Other chronic pain: Secondary | ICD-10-CM

## 2014-12-14 MED ORDER — ESCITALOPRAM OXALATE 10 MG PO TABS: 10.0000 mg | ORAL_TABLET | Freq: Every day | ORAL | Status: DC

## 2014-12-14 MED ORDER — ALPRAZOLAM 0.5 MG PO TABS: ORAL_TABLET | ORAL | Status: DC

## 2014-12-14 MED ORDER — HYDROCODONE-ACETAMINOPHEN 10-325 MG PO TABS: 1.0000 | ORAL_TABLET | Freq: Four times a day (QID) | ORAL | Status: DC | PRN

## 2014-12-14 NOTE — Progress Notes (Signed)
Patient ID: Amy Wiley, female    DOB: [birth year]  Age: 45 y.o. MRN: [id number]    Subjective:  Subjective HPI Amy Wiley presents for f/u add and anxiety.  She also needs a refill on pain meds.  Her anxiety has increased to where is she needeing 2 xanax every day.  She was without adderall for over a month and feels she does not need it anymore  Review of Systems  Constitutional: Negative for activity change, appetite change, fatigue and unexpected weight change.  Respiratory: Negative for cough and shortness of breath.   Cardiovascular: Negative for chest pain and palpitations.  Psychiatric/Behavioral: Negative for behavioral problems and dysphoric mood. The patient is nervous/anxious.     History Past Medical History  Diagnosis Date  . Anxiety   . ADD (attention deficit disorder)   . Abnormal Pap smear     She has past surgical history that includes Wisdom tooth extraction.   Her family history includes ALS in her paternal grandmother; Heart attack in her paternal grandmother and paternal uncle; Heart disease in her paternal grandfather and paternal grandmother; Heart disease (age of onset: 41) in her paternal uncle; Hypertension in her father.She reports that she has been smoking Cigarettes.  She has never used smokeless tobacco. She reports that she does not drink alcohol or use illicit drugs.  Current Outpatient Prescriptions on File Prior to Visit  Medication Sig Dispense Refill  . levonorgestrel (MIRENA) 20 MCG/24HR IUD 1 each by Intrauterine route once.       No current facility-administered medications on file prior to visit.     Objective:  Objective Physical Exam  Constitutional: She is oriented to person, place, and time. She appears well-developed and well-nourished.  HENT:  Head: Normocephalic and atraumatic.  Eyes: Conjunctivae and EOM are normal.  Neck: Normal range of motion. Neck supple. No JVD present. Carotid bruit is not present. No thyromegaly  present.  Cardiovascular: Normal rate, regular rhythm and normal heart sounds.   No murmur heard. Pulmonary/Chest: Effort normal and breath sounds normal. No respiratory distress. She has no wheezes. She has no rales. She exhibits no tenderness.  Musculoskeletal: She exhibits no edema.  Neurological: She is alert and oriented to person, place, and time.  Psychiatric: She has a normal mood and affect. Her behavior is normal.   BP 100/70 mmHg  Pulse 72  Temp(Src) 99 F (37.2 C) (Oral)  Ht  (1.549 m)  Wt 119 lb 3.2 oz (54.069 kg)  BMI 22.53 kg/m2  SpO2 99% Wt Readings from Last 3 Encounters:  12/14/14 119 lb 3.2 oz (54.069 kg)  06/13/14 114 lb 9.6 oz (51.982 kg)  01/17/14 112 lb (50.803 kg)     Lab Results  Component Value Date   WBC 8.2 02/08/2013   HGB 12.6 02/08/2013   HCT 37.0 02/08/2013   PLT 243.0 02/08/2013   GLUCOSE 85 02/08/2013   CHOL 198 02/08/2013   TRIG 100.0 02/08/2013   HDL 60.00 02/08/2013   LDLCALC 118* 02/08/2013   ALT 23 02/08/2013   AST 17 02/08/2013   NA 136 02/08/2013   K 3.6 02/08/2013   CL 104 02/08/2013   CREATININE 0.7 02/08/2013   BUN 9 02/08/2013   CO2 29 02/08/2013   TSH 0.77 02/08/2013    No results found.   Assessment & Plan:  Plan I have discontinued Ms. Balkcom's amphetamine-dextroamphetamine, amphetamine-dextroamphetamine, amphetamine-dextroamphetamine, and buPROPion. I am also having her start on escitalopram. Additionally, I am having her maintain her levonorgestrel,  doxylamine (Sleep), ALPRAZolam, and HYDROcodone-acetaminophen.  Meds ordered this encounter  Medications  . doxylamine, Sleep, (SLEEP AID) 25 MG tablet    Sig: Take 25 mg by mouth at bedtime as needed.  . ALPRAZolam (XANAX) 0.5 MG tablet    Sig: TAKE 1 TABLET BY MOUTH 3 TIMES A DAY AS NEEDED    Dispense:  60 tablet    Refill:  0  . escitalopram (LEXAPRO) 10 MG tablet    Sig: Take 1 tablet (10 mg total) by mouth daily.    Dispense:  30 tablet    Refill:  2   . HYDROcodone-acetaminophen (NORCO) 10-325 MG per tablet    Sig: Take 1 tablet by mouth every 6 (six) hours as needed.    Dispense:  60 tablet    Refill:  0    Problem List Items Addressed This Visit    Idiopathic scoliosis    Cause of most of back pain Refill pain med      RESOLVED: Dysmenorrhea   Relevant Medications   HYDROcodone-acetaminophen (NORCO) 10-325 MG per tablet   Chronic back pain   Relevant Medications   HYDROcodone-acetaminophen (NORCO) 10-325 MG per tablet    Other Visit Diagnoses    Anxiety state    -  Primary    Relevant Medications    ALPRAZolam (XANAX) 0.5 MG tablet    escitalopram (LEXAPRO) 10 MG tablet       Follow-up: Return in about 4 weeks (around 01/11/2015), or if symptoms worsen or fail to improve, for f/u anxiety.  Amy FreudYvonne Lowne, DO

## 2014-12-14 NOTE — Assessment & Plan Note (Signed)
Cause of most of back pain Refill pain med

## 2014-12-14 NOTE — Patient Instructions (Signed)
Generalized Anxiety Disorder Generalized anxiety disorder (GAD) is a mental disorder. It interferes with life functions, including relationships, work, and school. GAD is different from normal anxiety, which everyone experiences at some point in their lives in response to specific life events and activities. Normal anxiety actually helps us prepare for and get through these life events and activities. Normal anxiety goes away after the event or activity is over.  GAD causes anxiety that is not necessarily related to specific events or activities. It also causes excess anxiety in proportion to specific events or activities. The anxiety associated with GAD is also difficult to control. GAD can vary from mild to severe. People with severe GAD can have intense waves of anxiety with physical symptoms (panic attacks).  SYMPTOMS The anxiety and worry associated with GAD are difficult to control. This anxiety and worry are related to many life events and activities and also occur more days than not for 6 months or longer. People with GAD also have three or more of the following symptoms (one or more in children):  Restlessness.   Fatigue.  Difficulty concentrating.   Irritability.  Muscle tension.  Difficulty sleeping or unsatisfying sleep. DIAGNOSIS GAD is diagnosed through an assessment by your health care provider. Your health care provider will ask you questions aboutyour mood,physical symptoms, and events in your life. Your health care provider may ask you about your medical history and use of alcohol or drugs, including prescription medicines. Your health care provider may also do a physical exam and blood tests. Certain medical conditions and the use of certain substances can cause symptoms similar to those associated with GAD. Your health care provider may refer you to a mental health specialist for further evaluation. TREATMENT The following therapies are usually used to treat GAD:    Medication. Antidepressant medication usually is prescribed for long-term daily control. Antianxiety medicines may be added in severe cases, especially when panic attacks occur.   Talk therapy (psychotherapy). Certain types of talk therapy can be helpful in treating GAD by providing support, education, and guidance. A form of talk therapy called cognitive behavioral therapy can teach you healthy ways to think about and react to daily life events and activities.  Stress managementtechniques. These include yoga, meditation, and exercise and can be very helpful when they are practiced regularly. A mental health specialist can help determine which treatment is best for you. Some people see improvement with one therapy. However, other people require a combination of therapies. Document Released: 09/27/2012 Document Revised: 10/17/2013 Document Reviewed: 09/27/2012 Exit[employer] Patient Information 2015 [employer]. This information is not intended to replace advice given to you by your health care provider. Make sure you discuss any questions you have with your health care provider.  

## 2014-12-14 NOTE — Progress Notes (Signed)
Pre visit review using our clinic review tool, if applicable. No additional management support is needed unless otherwise documented below in the visit note. 

## 2015-01-08 ENCOUNTER — Telehealth: Payer: Self-pay | Admitting: Family Medicine

## 2015-01-08 DIAGNOSIS — N946 Dysmenorrhea, unspecified: Secondary | ICD-10-CM

## 2015-01-08 DIAGNOSIS — F411 Generalized anxiety disorder: Secondary | ICD-10-CM

## 2015-01-08 MED ORDER — ALPRAZOLAM 0.5 MG PO TABS: ORAL_TABLET | ORAL | Status: DC

## 2015-01-08 MED ORDER — HYDROCODONE-ACETAMINOPHEN 10-325 MG PO TABS: 1.0000 | ORAL_TABLET | Freq: Four times a day (QID) | ORAL | Status: DC | PRN

## 2015-01-08 NOTE — Telephone Encounter (Signed)
Patient aware Rx will be ready for pick up tomorrow.      KP 

## 2015-01-08 NOTE — Telephone Encounter (Signed)
Ok--- make a note It would be better if she told us up front about this in future

## 2015-01-08 NOTE — Telephone Encounter (Signed)
Last filled 12/14/14 #60 on both UDS 11/06/14 High risk, Neg for all med's Spoke with patient and she had ran out of the Hydrocodone and Xanax and took an old script of Percocet  Until she was due for refills.  Please advise    KP

## 2015-01-08 NOTE — Telephone Encounter (Signed)
Caller name: Lasheka Kempner Relationship to patient: self Can be reached: [phone]  Reason for call: Pt called for refills on hydrocodone and xanax. Pt states she has 2 days of each. Last fill was 12/14/14. Takes the xanax 2/day and hydrocodone 2 1/2-3 day.

## 2015-01-12 ENCOUNTER — Ambulatory Visit: Payer: 59 | Admitting: Family Medicine

## 2015-01-16 ENCOUNTER — Encounter: Payer: Self-pay | Admitting: Family Medicine

## 2015-01-16 ENCOUNTER — Ambulatory Visit (INDEPENDENT_AMBULATORY_CARE_PROVIDER_SITE_OTHER): Payer: 59 | Admitting: Family Medicine

## 2015-01-16 VITALS — BP 110/64 | HR 63 | Temp 98.4°F | Ht 61.0 in | Wt 124.0 lb

## 2015-01-16 DIAGNOSIS — M4120 Other idiopathic scoliosis, site unspecified: Secondary | ICD-10-CM | POA: Diagnosis not present

## 2015-01-16 DIAGNOSIS — F411 Generalized anxiety disorder: Secondary | ICD-10-CM | POA: Diagnosis not present

## 2015-01-16 NOTE — Patient Instructions (Signed)
Generalized Anxiety Disorder Generalized anxiety disorder (GAD) is a mental disorder. It interferes with life functions, including relationships, work, and school. GAD is different from normal anxiety, which everyone experiences at some point in their lives in response to specific life events and activities. Normal anxiety actually helps us prepare for and get through these life events and activities. Normal anxiety goes away after the event or activity is over.  GAD causes anxiety that is not necessarily related to specific events or activities. It also causes excess anxiety in proportion to specific events or activities. The anxiety associated with GAD is also difficult to control. GAD can vary from mild to severe. People with severe GAD can have intense waves of anxiety with physical symptoms (panic attacks).  SYMPTOMS The anxiety and worry associated with GAD are difficult to control. This anxiety and worry are related to many life events and activities and also occur more days than not for 6 months or longer. People with GAD also have three or more of the following symptoms (one or more in children):  Restlessness.   Fatigue.  Difficulty concentrating.   Irritability.  Muscle tension.  Difficulty sleeping or unsatisfying sleep. DIAGNOSIS GAD is diagnosed through an assessment by your health care provider. Your health care provider will ask you questions aboutyour mood,physical symptoms, and events in your life. Your health care provider may ask you about your medical history and use of alcohol or drugs, including prescription medicines. Your health care provider may also do a physical exam and blood tests. Certain medical conditions and the use of certain substances can cause symptoms similar to those associated with GAD. Your health care provider may refer you to a mental health specialist for further evaluation. TREATMENT The following therapies are usually used to treat GAD:    Medication. Antidepressant medication usually is prescribed for long-term daily control. Antianxiety medicines may be added in severe cases, especially when panic attacks occur.   Talk therapy (psychotherapy). Certain types of talk therapy can be helpful in treating GAD by providing support, education, and guidance. A form of talk therapy called cognitive behavioral therapy can teach you healthy ways to think about and react to daily life events and activities.  Stress managementtechniques. These include yoga, meditation, and exercise and can be very helpful when they are practiced regularly. A mental health specialist can help determine which treatment is best for you. Some people see improvement with one therapy. However, other people require a combination of therapies. Document Released: 09/27/2012 Document Revised: 10/17/2013 Document Reviewed: 09/27/2012 Exit[employer] Patient Information 2015 [employer]. This information is not intended to replace advice given to you by your health care provider. Make sure you discuss any questions you have with your health care provider.  

## 2015-01-16 NOTE — Progress Notes (Signed)
Pre visit review using our clinic review tool, if applicable. No additional management support is needed unless otherwise documented below in the visit note. 

## 2015-01-17 NOTE — Assessment & Plan Note (Signed)
con't xanax and lexapro

## 2015-01-17 NOTE — Progress Notes (Signed)
Patient ID: Colleena Kurtenbach, female    DOB: 09-26-1970  Age: 44 y.o. MRN: [id number]    Subjective:  Subjective HPI Ivanka Kirshner presents for f/u and med refills.   No new complaints.   Review of Systems  Constitutional: Negative for diaphoresis, appetite change, fatigue and unexpected weight change.  Eyes: Negative for pain, redness and visual disturbance.  Respiratory: Negative for cough, chest tightness, shortness of breath and wheezing.   Cardiovascular: Negative for chest pain, palpitations and leg swelling.  Endocrine: Negative for cold intolerance, heat intolerance, polydipsia, polyphagia and polyuria.  Genitourinary: Negative for dysuria, frequency and difficulty urinating.  Neurological: Negative for dizziness, light-headedness, numbness and headaches.    History Past Medical History  Diagnosis Date  . Anxiety   . ADD (attention deficit disorder)   . Abnormal Pap smear     She has past surgical history that includes Wisdom tooth extraction.   Her family history includes ALS in her paternal grandmother; Heart attack in her paternal grandmother and paternal uncle; Heart disease in her paternal grandfather and paternal grandmother; Heart disease (age of onset: 56) in her paternal uncle; Hypertension in her father.She reports that she has been smoking Cigarettes.  She has never used smokeless tobacco. She reports that she does not drink alcohol or use illicit drugs.  Current Outpatient Prescriptions on File Prior to Visit  Medication Sig Dispense Refill  . ALPRAZolam (XANAX) 0.5 MG tablet TAKE 1 TABLET BY MOUTH 3 TIMES A DAY AS NEEDED 60 tablet 0  . doxylamine, Sleep, (SLEEP AID) 25 MG tablet Take 25 mg by mouth at bedtime as needed.    Marland Kitchen escitalopram (LEXAPRO) 10 MG tablet Take 1 tablet (10 mg total) by mouth daily. 30 tablet 2  . HYDROcodone-acetaminophen (NORCO) 10-325 MG per tablet Take 1 tablet by mouth every 6 (six) hours as needed. 60 tablet 0  . levonorgestrel  (MIRENA) 20 MCG/24HR IUD 1 each by Intrauterine route once.       No current facility-administered medications on file prior to visit.     Objective:  Objective Physical Exam  Constitutional: She is oriented to person, place, and time. She appears well-developed and well-nourished.  HENT:  Head: Normocephalic and atraumatic.  Eyes: Conjunctivae and EOM are normal.  Neck: Normal range of motion. Neck supple. No JVD present. Carotid bruit is not present. No thyromegaly present.  Cardiovascular: Normal rate, regular rhythm and normal heart sounds.   No murmur heard. Pulmonary/Chest: Effort normal and breath sounds normal. No respiratory distress. She has no wheezes. She has no rales. She exhibits no tenderness.  Musculoskeletal: She exhibits no edema.  Neurological: She is alert and oriented to person, place, and time.  Psychiatric: She has a normal mood and affect. Her behavior is normal.   BP 110/64 mmHg  Pulse 63  Temp(Src) 98.4 F (36.9 C) (Oral)  Ht 5\' 1"  (1.549 m)  Wt 124 lb (56.246 kg)  BMI 23.44 kg/m2  SpO2 98% Wt Readings from Last 3 Encounters:  01/16/15 124 lb (56.246 kg)  12/14/14 119 lb 3.2 oz (54.069 kg)  06/13/14 114 lb 9.6 oz (51.982 kg)     Lab Results  Component Value Date   WBC 8.2 02/08/2013   HGB 12.6 02/08/2013   HCT 37.0 02/08/2013   PLT 243.0 02/08/2013   GLUCOSE 85 02/08/2013   CHOL 198 02/08/2013   TRIG 100.0 02/08/2013   HDL 60.00 02/08/2013   LDLCALC 118* 02/08/2013   ALT 23 02/08/2013   AST 17  02/08/2013   NA 136 02/08/2013   K 3.6 02/08/2013   CL 104 02/08/2013   CREATININE 0.7 02/08/2013   BUN 9 02/08/2013   CO2 29 02/08/2013   TSH 0.77 02/08/2013    No results found.   Assessment & Plan:  Plan I am having Ms. Geissinger maintain her levonorgestrel, doxylamine (Sleep), escitalopram, ALPRAZolam, and HYDROcodone-acetaminophen.  No orders of the defined types were placed in this encounter.    Problem List Items Addressed This  Visit    Idiopathic scoliosis - Primary    con't pain meds Good control of pain  F/u prn      Anxiety state    con't xanax and lexapro         Follow-up: Return in about 6 months (around 07/19/2015), or if symptoms worsen or fail to improve.  Loreen Freud, DO

## 2015-01-17 NOTE — Assessment & Plan Note (Signed)
con't pain meds Good control of pain  F/u prn

## 2015-02-05 ENCOUNTER — Telehealth: Payer: Self-pay | Admitting: Family Medicine

## 2015-02-05 DIAGNOSIS — N946 Dysmenorrhea, unspecified: Secondary | ICD-10-CM

## 2015-02-05 DIAGNOSIS — F411 Generalized anxiety disorder: Secondary | ICD-10-CM

## 2015-02-05 MED ORDER — ALPRAZOLAM 0.5 MG PO TABS: ORAL_TABLET | ORAL | Status: DC

## 2015-02-05 MED ORDER — HYDROCODONE-ACETAMINOPHEN 10-325 MG PO TABS: 1.0000 | ORAL_TABLET | Freq: Four times a day (QID) | ORAL | Status: DC | PRN

## 2015-02-05 NOTE — Telephone Encounter (Signed)
°  Relation to ZO:XWRU Call back number:[phone] Pharmacy:  Reason for call: pt is needing rx ALPRAZolam (XANAX) 0.5 MG tablet and HYDROcodone-acetaminophen (NORCO) 10-325 MG per tablet please call when available and ready for pick up

## 2015-02-05 NOTE — Telephone Encounter (Signed)
Refill x1 each 

## 2015-02-05 NOTE — Telephone Encounter (Signed)
Patient aware Rx ready for pick up.      KP 

## 2015-02-05 NOTE — Telephone Encounter (Addendum)
Last seen 01/16/15 and filled 01/08/15 Both #60 UDS 11/06/14 Neg for all med's.      Please advise     KP

## 2015-03-05 ENCOUNTER — Telehealth: Payer: Self-pay | Admitting: Family Medicine

## 2015-03-05 DIAGNOSIS — N946 Dysmenorrhea, unspecified: Secondary | ICD-10-CM

## 2015-03-05 DIAGNOSIS — F411 Generalized anxiety disorder: Secondary | ICD-10-CM

## 2015-03-05 MED ORDER — ESCITALOPRAM OXALATE 10 MG PO TABS: 10.0000 mg | ORAL_TABLET | Freq: Every day | ORAL | Status: DC

## 2015-03-05 MED ORDER — HYDROCODONE-ACETAMINOPHEN 10-325 MG PO TABS: 1.0000 | ORAL_TABLET | Freq: Four times a day (QID) | ORAL | Status: DC | PRN

## 2015-03-05 MED ORDER — ALPRAZOLAM 0.5 MG PO TABS: ORAL_TABLET | ORAL | Status: DC

## 2015-03-05 NOTE — Telephone Encounter (Signed)
Relation to pt: self Call back number:[phone] Pharmacy: [id number] - [address] [place] - [address] AT [facility] [address] [phone] (Phone) [phone] (Fax)         Reason for call:  Patient requesting a refill HYDROcodone-acetaminophen (NORCO) 10-325 MG per tablet, ALPRAZolam (XANAX) 0.5 MG tablet, escitalopram (LEXAPRO) 10 MG tablet

## 2015-03-05 NOTE — Telephone Encounter (Signed)
Last seen 01/16/15 and filled 02/05/15 Both #60 UDS 11/06/14 Neg for all med's.    Please advise KP

## 2015-03-05 NOTE — Telephone Encounter (Signed)
patient aware Rx ready for pick up tomorrow.      KP 

## 2015-03-05 NOTE — Telephone Encounter (Signed)
Refill x1 each 

## 2015-04-02 ENCOUNTER — Telehealth: Payer: Self-pay | Admitting: Family Medicine

## 2015-04-02 DIAGNOSIS — N946 Dysmenorrhea, unspecified: Secondary | ICD-10-CM

## 2015-04-02 DIAGNOSIS — F411 Generalized anxiety disorder: Secondary | ICD-10-CM

## 2015-04-02 MED ORDER — HYDROCODONE-ACETAMINOPHEN 10-325 MG PO TABS: 1.0000 | ORAL_TABLET | Freq: Four times a day (QID) | ORAL | Status: DC | PRN

## 2015-04-02 MED ORDER — ALPRAZOLAM 0.5 MG PO TABS: ORAL_TABLET | ORAL | Status: DC

## 2015-04-02 NOTE — Telephone Encounter (Signed)
Refill x1 

## 2015-04-02 NOTE — Telephone Encounter (Signed)
Last seen 01/16/15 and filled 03/05/15 UDS 02/06/15 low risk   Please advise    KP

## 2015-04-02 NOTE — Telephone Encounter (Signed)
Patient aware Rx ready for pick up.      KP 

## 2015-04-02 NOTE — Telephone Encounter (Signed)
Relation to WU:JWJXpt:self Call back number: [phone] Pharmacy: [id number] - [place], [place] - [address] [facility] [address]  Reason for call:  Patient requesting a refill ALPRAZolam (XANAX) 0.5 MG tablet and HYDROcodone-acetaminophen (NORCO) 10-325 MG per tablet

## 2015-05-01 ENCOUNTER — Telehealth: Payer: Self-pay | Admitting: Family Medicine

## 2015-05-01 DIAGNOSIS — N946 Dysmenorrhea, unspecified: Secondary | ICD-10-CM

## 2015-05-01 DIAGNOSIS — F411 Generalized anxiety disorder: Secondary | ICD-10-CM

## 2015-05-01 MED ORDER — HYDROCODONE-ACETAMINOPHEN 10-325 MG PO TABS: 1.0000 | ORAL_TABLET | Freq: Four times a day (QID) | ORAL | Status: DC | PRN

## 2015-05-01 MED ORDER — ALPRAZOLAM 0.5 MG PO TABS: ORAL_TABLET | ORAL | Status: DC

## 2015-05-01 NOTE — Telephone Encounter (Signed)
Last seen 01/16/15 and filled 04/02/15 #60 both UDS 02/07/15 low risk   Please advise     KP

## 2015-05-01 NOTE — Telephone Encounter (Signed)
Caller name: Joni Reiningicole  Relationship to patient: Self  Can be reached: [phone]  Pharmacy:  Reason for call: Pt called in for a refill on 2 medications. XANAX, HYDROcodone

## 2015-05-01 NOTE — Telephone Encounter (Signed)
Refill x1 

## 2015-05-01 NOTE — Addendum Note (Signed)
Addended by: Arnette NorrisPAYNE, Treylen Gibbs P on: 05/01/2015 04:00 PM   Modules accepted: Orders

## 2015-05-01 NOTE — Telephone Encounter (Signed)
Patient aware Rx is ready for pick up.      KP 

## 2015-05-30 ENCOUNTER — Telehealth: Payer: Self-pay | Admitting: Family Medicine

## 2015-05-30 DIAGNOSIS — F411 Generalized anxiety disorder: Secondary | ICD-10-CM

## 2015-05-30 DIAGNOSIS — N946 Dysmenorrhea, unspecified: Secondary | ICD-10-CM

## 2015-05-30 NOTE — Telephone Encounter (Signed)
Last seen 01/16/15 and filled both 05/01/15 #60 UDS 02/06/15 low risk.   Please advise    KP

## 2015-05-30 NOTE — Telephone Encounter (Signed)
Caller name: Self   Can be reached: [phone]  Pharmacy:  [facility] [id number] - [address], [place] - [address] [facility] [address] & [address] [phone] (Phone) [phone])         Reason for call: Request refills on HYDROcodone-acetaminophen (NORCO) 10-325 MG tablet [[id number]][[id number]] and ALPRAZolam (XANAX) 0.5 MG tablet [[id number]][[id number]]

## 2015-05-31 MED ORDER — ALPRAZOLAM 0.5 MG PO TABS: ORAL_TABLET | ORAL | Status: DC

## 2015-05-31 MED ORDER — HYDROCODONE-ACETAMINOPHEN 10-325 MG PO TABS: 1.0000 | ORAL_TABLET | Freq: Four times a day (QID) | ORAL | Status: DC | PRN

## 2015-05-31 NOTE — Telephone Encounter (Signed)
Patient aware med's ready after 3 pm today.     KP

## 2015-05-31 NOTE — Telephone Encounter (Signed)
Ok to refill x 1  

## 2015-05-31 NOTE — Addendum Note (Signed)
Addended by: Arnette NorrisPAYNE, Tennille Montelongo P on: 05/31/2015 12:11 PM   Modules accepted: Orders

## 2015-07-03 ENCOUNTER — Telehealth: Payer: Self-pay | Admitting: Family Medicine

## 2015-07-03 DIAGNOSIS — F411 Generalized anxiety disorder: Secondary | ICD-10-CM

## 2015-07-03 DIAGNOSIS — N946 Dysmenorrhea, unspecified: Secondary | ICD-10-CM

## 2015-07-03 MED ORDER — HYDROCODONE-ACETAMINOPHEN 10-325 MG PO TABS: 1.0000 | ORAL_TABLET | Freq: Four times a day (QID) | ORAL | Status: DC | PRN

## 2015-07-03 MED ORDER — ALPRAZOLAM 0.5 MG PO TABS: ORAL_TABLET | ORAL | Status: DC

## 2015-07-03 NOTE — Telephone Encounter (Signed)
Last seen 01/16/15 and filled both 05/31/15 #60 UDS 02/06/15 low risk.   Please advise KP

## 2015-07-03 NOTE — Telephone Encounter (Signed)
Refill x1 each 

## 2015-07-03 NOTE — Telephone Encounter (Signed)
Pharmacy: [facility] on [address]  Reason for call: pt called for refill on hydrocodone and xanax. She is out of both. Advised to call 3-5 days ahead and that it probably won't be ready until tomorrow. Please call when RX ready at [phone].

## 2015-07-03 NOTE — Telephone Encounter (Signed)
Patient aware RX ready for pick up.       KP 

## 2015-07-24 ENCOUNTER — Ambulatory Visit: Payer: 59 | Admitting: Family Medicine

## 2015-08-02 ENCOUNTER — Ambulatory Visit (HOSPITAL_BASED_OUTPATIENT_CLINIC_OR_DEPARTMENT_OTHER)
Admission: RE | Admit: 2015-08-02 | Discharge: 2015-08-02 | Disposition: A | Discharge status: Home / Self Care | Payer: BLUE CROSS/BLUE SHIELD | Source: Ambulatory Visit | Attending: Family Medicine | Admitting: Family Medicine

## 2015-08-02 ENCOUNTER — Ambulatory Visit (INDEPENDENT_AMBULATORY_CARE_PROVIDER_SITE_OTHER): Payer: BLUE CROSS/BLUE SHIELD | Admitting: Family Medicine

## 2015-08-02 ENCOUNTER — Encounter: Payer: Self-pay | Admitting: Family Medicine

## 2015-08-02 VITALS — BP 110/70 | HR 80 | Temp 98.5°F | Ht 61.0 in | Wt 123.0 lb

## 2015-08-02 DIAGNOSIS — M79671 Pain in right foot: Secondary | ICD-10-CM | POA: Diagnosis not present

## 2015-08-02 DIAGNOSIS — F909 Attention-deficit hyperactivity disorder, unspecified type: Secondary | ICD-10-CM | POA: Diagnosis not present

## 2015-08-02 DIAGNOSIS — M7731 Calcaneal spur, right foot: Secondary | ICD-10-CM | POA: Diagnosis not present

## 2015-08-02 DIAGNOSIS — N946 Dysmenorrhea, unspecified: Secondary | ICD-10-CM | POA: Diagnosis not present

## 2015-08-02 DIAGNOSIS — F988 Other specified behavioral and emotional disorders with onset usually occurring in childhood and adolescence: Secondary | ICD-10-CM

## 2015-08-02 DIAGNOSIS — F411 Generalized anxiety disorder: Secondary | ICD-10-CM

## 2015-08-02 MED ORDER — ALPRAZOLAM 0.5 MG PO TABS: ORAL_TABLET | ORAL | Status: DC

## 2015-08-02 MED ORDER — ESCITALOPRAM OXALATE 10 MG PO TABS: 10.0000 mg | ORAL_TABLET | Freq: Every day | ORAL | Status: DC

## 2015-08-02 MED ORDER — HYDROCODONE-ACETAMINOPHEN 10-325 MG PO TABS: 1.0000 | ORAL_TABLET | Freq: Four times a day (QID) | ORAL | Status: DC | PRN

## 2015-08-02 NOTE — Progress Notes (Signed)
Pre visit review using our clinic review tool, if applicable. No additional management support is needed unless otherwise documented below in the visit note. 

## 2015-08-02 NOTE — Progress Notes (Signed)
Patient ID: Amy Wiley, female    DOB: 11/19/1970  Age: 45 y.o. MRN: [id number]    Subjective:  Subjective HPI Amy Wiley presents for f/u anxiety and also c/o pain in R foot.  Along the ball of the foot and it sometimes radiates along side of foot.  No known injury.  It was worse over the summer.     Review of Systems  Constitutional: Negative for diaphoresis, appetite change, fatigue and unexpected weight change.  Eyes: Negative for pain, redness and visual disturbance.  Respiratory: Negative for cough, chest tightness, shortness of breath and wheezing.   Cardiovascular: Negative for chest pain, palpitations and leg swelling.  Endocrine: Negative for cold intolerance, heat intolerance, polydipsia, polyphagia and polyuria.  Genitourinary: Negative for dysuria, frequency and difficulty urinating.  Neurological: Negative for dizziness, light-headedness, numbness and headaches.    History Past Medical History  Diagnosis Date  . Anxiety   . ADD (attention deficit disorder)   . Abnormal Pap smear     She has past surgical history that includes Wisdom tooth extraction.   Her family history includes ALS in her paternal grandmother; Heart attack in her paternal grandmother and paternal uncle; Heart disease in her paternal grandfather and paternal grandmother; Heart disease (age of onset: 72) in her paternal uncle; Hypertension in her father.She reports that she has been smoking Cigarettes.  She has never used smokeless tobacco. She reports that she does not drink alcohol or use illicit drugs.  Current Outpatient Prescriptions on File Prior to Visit  Medication Sig Dispense Refill  . doxylamine, Sleep, (SLEEP AID) 25 MG tablet Take 25 mg by mouth at bedtime as needed.    Marland Kitchen levonorgestrel (MIRENA) 20 MCG/24HR IUD 1 each by Intrauterine route once.       No current facility-administered medications on file prior to visit.     Objective:  Objective Physical Exam  Constitutional:  She is oriented to person, place, and time. She appears well-developed and well-nourished.  HENT:  Head: Normocephalic and atraumatic.  Eyes: Conjunctivae and EOM are normal.  Neck: Normal range of motion. Neck supple. No JVD present. Carotid bruit is not present. No thyromegaly present.  Cardiovascular: Normal rate, regular rhythm and normal heart sounds.   No murmur heard. Pulmonary/Chest: Effort normal and breath sounds normal. No respiratory distress. She has no wheezes. She has no rales. She exhibits no tenderness.  Musculoskeletal: She exhibits no edema.  Neurological: She is alert and oriented to person, place, and time.  Psychiatric: She has a normal mood and affect. Her behavior is normal. Judgment and thought content normal.  Nursing note and vitals reviewed.  BP 110/70 mmHg  Pulse 80  Temp(Src) 98.5 F (36.9 C) (Oral)  Ht  (1.549 m)  Wt 123 lb (55.792 kg)  BMI 23.25 kg/m2  SpO2 98% Wt Readings from Last 3 Encounters:  08/02/15 123 lb (55.792 kg)  01/16/15 124 lb (56.246 kg)  12/14/14 119 lb 3.2 oz (54.069 kg)     Lab Results  Component Value Date   WBC 8.2 02/08/2013   HGB 12.6 02/08/2013   HCT 37.0 02/08/2013   PLT 243.0 02/08/2013   GLUCOSE 85 02/08/2013   CHOL 198 02/08/2013   TRIG 100.0 02/08/2013   HDL 60.00 02/08/2013   LDLCALC 118* 02/08/2013   ALT 23 02/08/2013   AST 17 02/08/2013   NA 136 02/08/2013   K 3.6 02/08/2013   CL 104 02/08/2013   CREATININE 0.7 02/08/2013   BUN 9 02/08/2013  CO2 29 02/08/2013   TSH 0.77 02/08/2013    No results found.   Assessment & Plan:  Plan I am having Ms. Waugh maintain her levonorgestrel, doxylamine (Sleep), ALPRAZolam, HYDROcodone-acetaminophen, and escitalopram.  Meds ordered this encounter  Medications  . ALPRAZolam (XANAX) 0.5 MG tablet    Sig: TAKE 1 TABLET BY MOUTH 3 TIMES A DAY AS NEEDED    Dispense:  60 tablet    Refill:  0  . HYDROcodone-acetaminophen (NORCO) 10-325 MG tablet    Sig:  Take 1 tablet by mouth every 6 (six) hours as needed.    Dispense:  60 tablet    Refill:  0  . escitalopram (LEXAPRO) 10 MG tablet    Sig: Take 1 tablet (10 mg total) by mouth daily.    Dispense:  30 tablet    Refill:  5    Problem List Items Addressed This Visit      Unprioritized   Anxiety state - Primary   Relevant Medications   ALPRAZolam (XANAX) 0.5 MG tablet   escitalopram (LEXAPRO) 10 MG tablet    Other Visit Diagnoses    Dysmenorrhea        Relevant Medications    HYDROcodone-acetaminophen (NORCO) 10-325 MG tablet    ADD (attention deficit disorder)        Right foot pain        Relevant Orders    DG Foot Complete Right (Completed)       Follow-up: Return in about 6 months (around 01/30/2016), or if symptoms worsen or fail to improve.  Loreen Freud, DO

## 2015-08-02 NOTE — Patient Instructions (Signed)
Attention Deficit Hyperactivity Disorder  Attention deficit hyperactivity disorder (ADHD) is a problem with behavior issues based on the way the brain functions (neurobehavioral disorder). It is a common reason for behavior and academic problems in school.  SYMPTOMS   There are 3 types of ADHD. The 3 types and some of the symptoms include:  · Inattentive.    Gets bored or distracted easily.    Loses or forgets things. Forgets to hand in homework.    Has trouble organizing or completing tasks.    Difficulty staying on task.    An inability to organize daily tasks and school work.    Leaving projects, chores, or homework unfinished.    Trouble paying attention or responding to details. Careless mistakes.    Difficulty following directions. Often seems like is not listening.    Dislikes activities that require sustained attention (like chores or homework).  · Hyperactive-impulsive.    Feels like it is impossible to sit still or stay in a seat. Fidgeting with hands and feet.    Trouble waiting turn.    Talking too much or out of turn. Interruptive.    Speaks or acts impulsively.    Aggressive, disruptive behavior.    Constantly busy or on the go; noisy.    Often leaves seat when they are expected to remain seated.    Often runs or climbs where it is not appropriate, or feels very restless.  · Combined.    Has symptoms of both of the above.  Often children with ADHD feel discouraged about themselves and with school. They often perform well below their abilities in school.  As children get older, the excess motor activities can calm down, but the problems with paying attention and staying organized persist. Most children do not outgrow ADHD but with good treatment can learn to cope with the symptoms.  DIAGNOSIS   When ADHD is suspected, the diagnosis should be made by professionals trained in ADHD. This professional will collect information about the individual suspected of having ADHD. Information must be collected from  various settings where the person lives, works, or attends school.    Diagnosis will include:  · Confirming symptoms began in childhood.  · Ruling out other reasons for the child's behavior.  · The health care providers will check with the child's school and check their medical records.  · They will talk to teachers and parents.  · Behavior rating scales for the child will be filled out by those dealing with the child on a daily basis.  A diagnosis is made only after all information has been considered.  TREATMENT   Treatment usually includes behavioral treatment, tutoring or extra support in school, and stimulant medicines. Because of the way a person's brain works with ADHD, these medicines decrease impulsivity and hyperactivity and increase attention. This is different than how they would work in a person who does not have ADHD. Other medicines used include antidepressants and certain blood pressure medicines.  Most experts agree that treatment for ADHD should address all aspects of the person's functioning. Along with medicines, treatment should include structured classroom management at school. Parents should reward good behavior, provide constant discipline, and set limits. Tutoring should be available for the child as needed.  ADHD is a lifelong condition. If untreated, the disorder can have long-term serious effects into adolescence and adulthood.  HOME CARE INSTRUCTIONS   · Often with ADHD there is a lot of frustration among family members dealing with the condition. Blame   and anger are also feelings that are common. In many cases, because the problem affects the family as a whole, the entire family may need help. A therapist can help the family find better ways to handle the disruptive behaviors of the person with ADHD and promote change. If the person with ADHD is young, most of the therapist's work is with the parents. Parents will learn techniques for coping with and improving their child's behavior.  Sometimes only the child with the ADHD needs counseling. Your health care providers can help you make these decisions.  · Children with ADHD may need help learning how to organize. Some helpful tips include:  ¨ Keep routines the same every day from wake-up time to bedtime. Schedule all activities, including homework and playtime. Keep the schedule in a place where the person with ADHD will often see it. Mark schedule changes as far in advance as possible.  ¨ Schedule outdoor and indoor recreation.  ¨ Have a place for everything and keep everything in its place. This includes clothing, backpacks, and school supplies.  ¨ Encourage writing down assignments and bringing home needed books. Work with your child's teachers for assistance in organizing school work.  · Offer your child a well-balanced diet. Breakfast that includes a balance of whole grains, protein, and fruits or vegetables is especially important for school performance. Children should avoid drinks with caffeine including:  ¨ Soft drinks.  ¨ Coffee.  ¨ Tea.  ¨ However, some older children (adolescents) may find these drinks helpful in improving their attention. Because it can also be common for adolescents with ADHD to become addicted to caffeine, talk with your health care provider about what is a safe amount of caffeine intake for your child.  · Children with ADHD need consistent rules that they can understand and follow. If rules are followed, give small rewards. Children with ADHD often receive, and expect, criticism. Look for good behavior and praise it. Set realistic goals. Give clear instructions. Look for activities that can foster success and self-esteem. Make time for pleasant activities with your child. Give lots of affection.  · Parents are their children's greatest advocates. Learn as much as possible about ADHD. This helps you become a stronger and better advocate for your child. It also helps you educate your child's teachers and instructors  if they feel inadequate in these areas. Parent support groups are often helpful. A national group with local chapters is called Children and Adults with Attention Deficit Hyperactivity Disorder (CHADD).  SEEK MEDICAL CARE IF:  · Your child has repeated muscle twitches, cough, or speech outbursts.  · Your child has sleep problems.  · Your child has a marked loss of appetite.  · Your child develops depression.  · Your child has new or worsening behavioral problems.  · Your child develops dizziness.  · Your child has a racing heart.  · Your child has stomach pains.  · Your child develops headaches.  SEEK IMMEDIATE MEDICAL CARE IF:  · Your child has been diagnosed with depression or anxiety and the symptoms seem to be getting worse.  · Your child has been depressed and suddenly appears to have increased energy or motivation.  · You are worried that your child is having a bad reaction to a medication he or she is taking for ADHD.     This information is not intended to replace advice given to you by your health care provider. Make sure you discuss any questions you have with your   health care provider.     Document Released: 05/23/2002 Document Revised: 06/07/2013 Document Reviewed: 02/07/2013  Elsevier Interactive Patient Education ©2016 Elsevier Inc.

## 2015-08-30 ENCOUNTER — Telehealth: Payer: Self-pay | Admitting: Family Medicine

## 2015-08-30 DIAGNOSIS — N946 Dysmenorrhea, unspecified: Secondary | ICD-10-CM

## 2015-08-30 DIAGNOSIS — F411 Generalized anxiety disorder: Secondary | ICD-10-CM

## 2015-08-30 NOTE — Telephone Encounter (Signed)
Pt called for refill on hydrocodone and xanax. She has 4 of each left. Takes 2/day each. Call when RXs ready [phone]. Will take paper RX for both.

## 2015-08-30 NOTE — Telephone Encounter (Signed)
Refill each x1 

## 2015-08-30 NOTE — Telephone Encounter (Signed)
Last seen and filled 08/02/15 #60 on Both. UDS 02/07/15 low risk   Please advise     KP

## 2015-08-31 MED ORDER — HYDROCODONE-ACETAMINOPHEN 10-325 MG PO TABS: 1.0000 | ORAL_TABLET | Freq: Four times a day (QID) | ORAL | Status: DC | PRN

## 2015-08-31 MED ORDER — ALPRAZOLAM 0.5 MG PO TABS: ORAL_TABLET | ORAL | Status: DC

## 2015-08-31 NOTE — Telephone Encounter (Signed)
Patient aware Rx is ready for pick up on Monday.     KP

## 2015-10-01 ENCOUNTER — Telehealth: Payer: Self-pay | Admitting: Family Medicine

## 2015-10-01 DIAGNOSIS — F411 Generalized anxiety disorder: Secondary | ICD-10-CM

## 2015-10-01 DIAGNOSIS — N946 Dysmenorrhea, unspecified: Secondary | ICD-10-CM

## 2015-10-01 MED ORDER — HYDROCODONE-ACETAMINOPHEN 10-325 MG PO TABS: 1.0000 | ORAL_TABLET | Freq: Four times a day (QID) | ORAL | Status: DC | PRN

## 2015-10-01 MED ORDER — ALPRAZOLAM 0.5 MG PO TABS: ORAL_TABLET | ORAL | Status: DC

## 2015-10-01 NOTE — Telephone Encounter (Signed)
Last seen  08/02/15 and filled 08/31/15 #60 on Both. UDS 02/07/15 low risk   Please advise KP

## 2015-10-01 NOTE — Telephone Encounter (Signed)
Relation to ZO:XWRUpt:self Call back [phone] Pharmacy:  Reason for call:  Patient requesting a refill ALPRAZolam (XANAX) 0.5 MG tablet and HYDROcodone-acetaminophen (NORCO) 10-325 MG tablet

## 2015-10-01 NOTE — Telephone Encounter (Signed)
Rx faxed to pharmacy  

## 2015-10-01 NOTE — Telephone Encounter (Signed)
Rx printed, to PCP for signature. 

## 2015-10-01 NOTE — Telephone Encounter (Signed)
Refill x1 

## 2015-10-29 ENCOUNTER — Other Ambulatory Visit: Payer: Self-pay

## 2015-10-29 ENCOUNTER — Other Ambulatory Visit: Payer: Self-pay | Admitting: Family Medicine

## 2015-10-29 DIAGNOSIS — F411 Generalized anxiety disorder: Secondary | ICD-10-CM

## 2015-10-29 DIAGNOSIS — N946 Dysmenorrhea, unspecified: Secondary | ICD-10-CM

## 2015-10-29 MED ORDER — HYDROCODONE-ACETAMINOPHEN 10-325 MG PO TABS: 1.0000 | ORAL_TABLET | Freq: Four times a day (QID) | ORAL | Status: DC | PRN

## 2015-10-29 NOTE — Telephone Encounter (Signed)
Refill x1 

## 2015-10-29 NOTE — Telephone Encounter (Signed)
Medication refilled per patient request. Ok'd by Dr. Zola ButtonLowne-Chase.

## 2015-10-29 NOTE — Telephone Encounter (Signed)
Can be reached: [phone]   Reason for call: Pt needing refill on hydrocodone and xanax. She has enough for today. She would like to come in to p/u paper RXs for both medications (today if possible). Please call when RX ready.

## 2015-10-29 NOTE — Telephone Encounter (Signed)
Requesting Hydrocodone-acet. 10-325mg -Take 1 tablet by mouth every 6 hours as needed.                    Xanax 0.5mg -Take 1 tablet by mouth 3 times a day as needed. Last refill:10/01/15;#60,0                10/01/15;#60,0 Last OV:08/02/15 UDS:06/04/12-Low risk-Due Please advise.//AB/CMA

## 2015-10-30 MED ORDER — ALPRAZOLAM 0.5 MG PO TABS: ORAL_TABLET | ORAL | Status: DC

## 2015-10-30 NOTE — Telephone Encounter (Signed)
[facility] @ [phone]) informing the pt that prescription refill request has been approved and ready to be picked up.  Will place the prescription up front.//AB/CMA

## 2015-11-28 ENCOUNTER — Telehealth: Payer: Self-pay | Admitting: Family Medicine

## 2015-11-28 DIAGNOSIS — N946 Dysmenorrhea, unspecified: Secondary | ICD-10-CM

## 2015-11-28 DIAGNOSIS — F411 Generalized anxiety disorder: Secondary | ICD-10-CM

## 2015-11-28 NOTE — Telephone Encounter (Signed)
It is the Xanax not the IUD.

## 2015-11-28 NOTE — Telephone Encounter (Signed)
Refill xanax x1

## 2015-11-28 NOTE — Telephone Encounter (Signed)
Denise--Please confirm the Mirena request, since we do not place those in this office.  Thanks   Hydrocodone requested Last seen 08/02/15 and filled 10/29/15 #60 UDS 02/06/15 low risk    Please advise      KP

## 2015-11-28 NOTE — Telephone Encounter (Signed)
°  Relationship to patient: Self Can be reached: [phone]   Reason for call: Refills on levonorgestrel (MIRENA) 20 MCG/24HR IUD [[id number] and HYDROcodone-acetaminophen (NORCO) 10-325 MG tablet [[id number]][[id number]]

## 2015-11-29 MED ORDER — HYDROCODONE-ACETAMINOPHEN 10-325 MG PO TABS: 1.0000 | ORAL_TABLET | Freq: Four times a day (QID) | ORAL | Status: DC | PRN

## 2015-11-29 MED ORDER — ALPRAZOLAM 0.5 MG PO TABS: ORAL_TABLET | ORAL | Status: DC

## 2015-11-29 NOTE — Telephone Encounter (Signed)
Patient aware Rx is ready for pick up.      KP 

## 2015-11-29 NOTE — Telephone Encounter (Signed)
Rx printed and forwarded to PCP for signature. 

## 2016-01-01 ENCOUNTER — Telehealth: Payer: Self-pay | Admitting: Family Medicine

## 2016-01-01 DIAGNOSIS — N946 Dysmenorrhea, unspecified: Secondary | ICD-10-CM

## 2016-01-01 DIAGNOSIS — F411 Generalized anxiety disorder: Secondary | ICD-10-CM

## 2016-01-01 NOTE — Telephone Encounter (Signed)
°  Relation to NW:GNFApt:self Call back number: Pharmacy:  Reason for call:  Patient requesting a refill HYDROcodone-acetaminophen (NORCO) 10-325 MG tablet and ALPRAZolam (XANAX) 0.5 MG tablet

## 2016-01-02 ENCOUNTER — Encounter: Payer: Self-pay | Admitting: Family Medicine

## 2016-01-02 MED ORDER — ALPRAZOLAM 0.5 MG PO TABS: ORAL_TABLET | ORAL | Status: DC

## 2016-01-02 MED ORDER — HYDROCODONE-ACETAMINOPHEN 10-325 MG PO TABS: 1.0000 | ORAL_TABLET | Freq: Four times a day (QID) | ORAL | Status: DC | PRN

## 2016-01-02 NOTE — Telephone Encounter (Signed)
Last OV: 08/02/15 Next OV: due 01/30/16, not scheduled yet. UDS: low risk, 10/2015 and due 04/2016 Last Rx: 11/29/15, #60 for each med.  Rxs printed and forwarded to PCP red folder for signature.

## 2016-01-03 NOTE — Telephone Encounter (Signed)
Please inform Pt that Rx's have been placed at front desk for pick up. Thank you.  

## 2016-01-03 NOTE — Telephone Encounter (Signed)
Pt called in to follow up on her request. Pt says that she is now out of her medication.    Informed pt of the below.

## 2016-01-03 NOTE — Telephone Encounter (Signed)
Left patient a message making her aware that Rx's can be picked up.

## 2016-01-31 ENCOUNTER — Encounter: Payer: Self-pay | Admitting: Family Medicine

## 2016-01-31 ENCOUNTER — Ambulatory Visit (INDEPENDENT_AMBULATORY_CARE_PROVIDER_SITE_OTHER): Payer: BLUE CROSS/BLUE SHIELD | Admitting: Family Medicine

## 2016-01-31 VITALS — BP 110/68 | HR 64 | Temp 99.3°F | Wt 117.0 lb

## 2016-01-31 DIAGNOSIS — F411 Generalized anxiety disorder: Secondary | ICD-10-CM

## 2016-01-31 DIAGNOSIS — N946 Dysmenorrhea, unspecified: Secondary | ICD-10-CM | POA: Diagnosis not present

## 2016-01-31 MED ORDER — HYDROCODONE-ACETAMINOPHEN 10-325 MG PO TABS: 1.0000 | ORAL_TABLET | Freq: Four times a day (QID) | ORAL | 0 refills | Status: DC | PRN

## 2016-01-31 MED ORDER — FLUOXETINE HCL 10 MG PO CAPS: 10.0000 mg | ORAL_CAPSULE | Freq: Every day | ORAL | 3 refills | Status: DC

## 2016-01-31 MED ORDER — ALPRAZOLAM 0.5 MG PO TABS: ORAL_TABLET | ORAL | 0 refills | Status: DC

## 2016-01-31 NOTE — Progress Notes (Signed)
Patient ID: Maryln Gottronicole Mantei, female    DOB: 09/08/1970  Age: 45 y.o. MRN: [id number]    Subjective:  Subjective  HPI Maryln Gottronicole Culpepper presents for f/u anxiety --- she had to stop the lexapro because it made her so tired.     Review of Systems  Constitutional: Negative for activity change, appetite change, fatigue and unexpected weight change.  Respiratory: Negative for cough and shortness of breath.   Cardiovascular: Negative for chest pain and palpitations.  Psychiatric/Behavioral: Negative for behavioral problems and dysphoric mood. The patient is nervous/anxious.     History Past Medical History:  Diagnosis Date  . Abnormal Pap smear   . ADD (attention deficit disorder)   . Anxiety     She has a past surgical history that includes Wisdom tooth extraction.   Her family history includes ALS in her paternal grandmother; Heart attack in her paternal grandmother and paternal uncle; Heart disease in her paternal grandfather and paternal grandmother; Heart disease (age of onset: 2065) in her paternal uncle; Hypertension in her father.She reports that she has been smoking Cigarettes.  She has never used smokeless tobacco. She reports that she does not drink alcohol or use drugs.  Current Outpatient Prescriptions on File Prior to Visit  Medication Sig Dispense Refill  . levonorgestrel (MIRENA) 20 MCG/24HR IUD 1 each by Intrauterine route once.      . doxylamine, Sleep, (SLEEP AID) 25 MG tablet Take 25 mg by mouth at bedtime as needed.     No current facility-administered medications on file prior to visit.      Objective:  Objective  Physical Exam  Constitutional: She is oriented to person, place, and time. She appears well-developed and well-nourished.  HENT:  Head: Normocephalic and atraumatic.  Eyes: Conjunctivae and EOM are normal.  Neck: Normal range of motion. Neck supple. No JVD present. Carotid bruit is not present. No thyromegaly present.  Cardiovascular: Normal rate, regular  rhythm and normal heart sounds.   No murmur heard. Pulmonary/Chest: Effort normal and breath sounds normal. No respiratory distress. She has no wheezes. She has no rales. She exhibits no tenderness.  Musculoskeletal: She exhibits no edema.  Neurological: She is alert and oriented to person, place, and time.  Psychiatric: She has a normal mood and affect. Her behavior is normal. Judgment and thought content normal.  Nursing note and vitals reviewed.  BP 110/68 (BP Location: Right Arm, Patient Position: Sitting, Cuff Size: Normal)   Pulse 64   Temp 99.3 F (37.4 C) (Oral)   Wt 117 lb (53.1 kg)   BMI 22.11 kg/m  Wt Readings from Last 3 Encounters:  01/31/16 117 lb (53.1 kg)  08/02/15 123 lb (55.8 kg)  01/16/15 124 lb (56.2 kg)     Lab Results  Component Value Date   WBC 8.2 02/08/2013   HGB 12.6 02/08/2013   HCT 37.0 02/08/2013   PLT 243.0 02/08/2013   GLUCOSE 85 02/08/2013   CHOL 198 02/08/2013   TRIG 100.0 02/08/2013   HDL 60.00 02/08/2013   LDLCALC 118 (H) 02/08/2013   ALT 23 02/08/2013   AST 17 02/08/2013   NA 136 02/08/2013   K 3.6 02/08/2013   CL 104 02/08/2013   CREATININE 0.7 02/08/2013   BUN 9 02/08/2013   CO2 29 02/08/2013   TSH 0.77 02/08/2013    Dg Foot Complete Right  Result Date: 08/02/2015 CLINICAL DATA:  Right foot plantar pain no known injury EXAM: RIGHT FOOT COMPLETE - 3+ VIEW COMPARISON:  None. FINDINGS:  Three views of the right foot submitted. No acute fracture or subluxation. There is small plantar spur of calcaneus. IMPRESSION: No acute fracture or subluxation.  Small plantar spur of calcaneus. Electronically Signed   By: Natasha MeadLiviu  Pop M.D.   On: 08/02/2015 14:59     Assessment & Plan:  Plan  I have discontinued Ms. Bulluck's escitalopram. I am also having her start on FLUoxetine. Additionally, I am having her maintain her levonorgestrel, doxylamine (Sleep), ALPRAZolam, and HYDROcodone-acetaminophen.  Meds ordered this encounter  Medications  .  FLUoxetine (PROZAC) 10 MG capsule    Sig: Take 1 capsule (10 mg total) by mouth daily.    Dispense:  30 capsule    Refill:  3  . ALPRAZolam (XANAX) 0.5 MG tablet    Sig: TAKE 1 TABLET BY MOUTH 3 TIMES A DAY AS NEEDED    Dispense:  60 tablet    Refill:  0  . HYDROcodone-acetaminophen (NORCO) 10-325 MG tablet    Sig: Take 1 tablet by mouth every 6 (six) hours as needed.    Dispense:  60 tablet    Refill:  0    Problem List Items Addressed This Visit      Unprioritized   Anxiety state   Relevant Medications   FLUoxetine (PROZAC) 10 MG capsule   ALPRAZolam (XANAX) 0.5 MG tablet    Other Visit Diagnoses    Generalized anxiety disorder    -  Primary   Relevant Medications   FLUoxetine (PROZAC) 10 MG capsule   Dysmenorrhea       Relevant Medications   HYDROcodone-acetaminophen (NORCO) 10-325 MG tablet      Follow-up: No Follow-up on file.  Donato SchultzYvonne R Lowne Chase, DO

## 2016-01-31 NOTE — Patient Instructions (Signed)
Generalized Anxiety Disorder Generalized anxiety disorder (GAD) is a mental disorder. It interferes with life functions, including relationships, work, and school. GAD is different from normal anxiety, which everyone experiences at some point in their lives in response to specific life events and activities. Normal anxiety actually helps us prepare for and get through these life events and activities. Normal anxiety goes away after the event or activity is over.  GAD causes anxiety that is not necessarily related to specific events or activities. It also causes excess anxiety in proportion to specific events or activities. The anxiety associated with GAD is also difficult to control. GAD can vary from mild to severe. People with severe GAD can have intense waves of anxiety with physical symptoms (panic attacks).  SYMPTOMS The anxiety and worry associated with GAD are difficult to control. This anxiety and worry are related to many life events and activities and also occur more days than not for 6 months or longer. People with GAD also have three or more of the following symptoms (one or more in children):  Restlessness.   Fatigue.  Difficulty concentrating.   Irritability.  Muscle tension.  Difficulty sleeping or unsatisfying sleep. DIAGNOSIS GAD is diagnosed through an assessment by your health care provider. Your health care provider will ask you questions aboutyour mood,physical symptoms, and events in your life. Your health care provider may ask you about your medical history and use of alcohol or drugs, including prescription medicines. Your health care provider may also do a physical exam and blood tests. Certain medical conditions and the use of certain substances can cause symptoms similar to those associated with GAD. Your health care provider may refer you to a mental health specialist for further evaluation. TREATMENT The following therapies are usually used to treat GAD:    Medication. Antidepressant medication usually is prescribed for long-term daily control. Antianxiety medicines may be added in severe cases, especially when panic attacks occur.   Talk therapy (psychotherapy). Certain types of talk therapy can be helpful in treating GAD by providing support, education, and guidance. A form of talk therapy called cognitive behavioral therapy can teach you healthy ways to think about and react to daily life events and activities.  Stress managementtechniques. These include yoga, meditation, and exercise and can be very helpful when they are practiced regularly. A mental health specialist can help determine which treatment is best for you. Some people see improvement with one therapy. However, other people require a combination of therapies.   This information is not intended to replace advice given to you by your health care provider. Make sure you discuss any questions you have with your health care provider.   Document Released: 09/27/2012 Document Revised: 06/23/2014 Document Reviewed: 09/27/2012 Elsevier Interactive Patient Education 2016 Elsevier Inc.  

## 2016-01-31 NOTE — Progress Notes (Signed)
Pre visit review using our clinic review tool, if applicable. No additional management support is needed unless otherwise documented below in the visit note. 

## 2016-02-26 ENCOUNTER — Other Ambulatory Visit: Payer: Self-pay | Admitting: Family Medicine

## 2016-02-26 ENCOUNTER — Encounter: Payer: Self-pay | Admitting: Family Medicine

## 2016-02-26 DIAGNOSIS — F411 Generalized anxiety disorder: Secondary | ICD-10-CM

## 2016-02-26 DIAGNOSIS — N946 Dysmenorrhea, unspecified: Secondary | ICD-10-CM

## 2016-02-26 MED ORDER — HYDROCODONE-ACETAMINOPHEN 10-325 MG PO TABS: 1.0000 | ORAL_TABLET | Freq: Four times a day (QID) | ORAL | 0 refills | Status: DC | PRN

## 2016-02-26 MED ORDER — ALPRAZOLAM 0.5 MG PO TABS: ORAL_TABLET | ORAL | 0 refills | Status: DC

## 2016-02-26 NOTE — Telephone Encounter (Signed)
Last seen and filled 01/31/16 Hydrocodone #60 Xanax #60  UDS 10/31/15 low risk   Please advise    KP

## 2016-03-24 ENCOUNTER — Encounter: Payer: Self-pay | Admitting: Family Medicine

## 2016-03-24 DIAGNOSIS — F411 Generalized anxiety disorder: Secondary | ICD-10-CM

## 2016-03-24 DIAGNOSIS — N946 Dysmenorrhea, unspecified: Secondary | ICD-10-CM

## 2016-03-24 MED ORDER — ALPRAZOLAM 0.5 MG PO TABS: ORAL_TABLET | ORAL | 0 refills | Status: DC

## 2016-03-24 MED ORDER — HYDROCODONE-ACETAMINOPHEN 10-325 MG PO TABS: 1.0000 | ORAL_TABLET | Freq: Four times a day (QID) | ORAL | 0 refills | Status: DC | PRN

## 2016-03-24 NOTE — Telephone Encounter (Signed)
Last seen 01/31/16 and filled  Xanax #60 Hydrocodone #60  UDS 10/31/15 Low risk [facility] patient.    Please advise    KP

## 2016-04-23 ENCOUNTER — Encounter: Payer: Self-pay | Admitting: Family Medicine

## 2016-04-24 ENCOUNTER — Encounter: Payer: Self-pay | Admitting: Family Medicine

## 2016-04-24 ENCOUNTER — Other Ambulatory Visit: Payer: Self-pay | Admitting: Family Medicine

## 2016-04-24 DIAGNOSIS — F411 Generalized anxiety disorder: Secondary | ICD-10-CM

## 2016-04-24 DIAGNOSIS — N946 Dysmenorrhea, unspecified: Secondary | ICD-10-CM

## 2016-04-24 MED ORDER — HYDROCODONE-ACETAMINOPHEN 10-325 MG PO TABS: 1.0000 | ORAL_TABLET | Freq: Four times a day (QID) | ORAL | 0 refills | Status: DC | PRN

## 2016-04-24 MED ORDER — ALPRAZOLAM 0.5 MG PO TABS
ORAL_TABLET | ORAL | 1 refills | Status: DC
Start: 2016-04-24 — End: 2016-05-23

## 2016-04-24 NOTE — Telephone Encounter (Signed)
Relation to pt: self Call back number: [phone]    Reason for call:  Patient checking on the status of ALPRAZolam (XANAX) 0.5 MG tablet and HYDROcodone-acetaminophen (NORCO) 10-325 MG tablet request, patient will be in the area around 12 noon, patient is going out of town tomorrow. Informed patient PCP was out of office yesterday and will follow up. Please advise

## 2016-04-24 NOTE — Telephone Encounter (Signed)
Rx's have been printed.  Message routed to CMA/LPN for follow up.

## 2016-05-20 ENCOUNTER — Encounter: Payer: Self-pay | Admitting: Family Medicine

## 2016-05-21 ENCOUNTER — Other Ambulatory Visit: Payer: Self-pay | Admitting: Family Medicine

## 2016-05-21 DIAGNOSIS — F411 Generalized anxiety disorder: Secondary | ICD-10-CM

## 2016-05-23 ENCOUNTER — Other Ambulatory Visit: Payer: Self-pay

## 2016-05-23 DIAGNOSIS — N946 Dysmenorrhea, unspecified: Secondary | ICD-10-CM

## 2016-05-23 DIAGNOSIS — F411 Generalized anxiety disorder: Secondary | ICD-10-CM

## 2016-05-23 MED ORDER — ALPRAZOLAM 0.5 MG PO TABS: ORAL_TABLET | ORAL | 1 refills | Status: DC

## 2016-05-23 MED ORDER — HYDROCODONE-ACETAMINOPHEN 10-325 MG PO TABS: 1.0000 | ORAL_TABLET | Freq: Four times a day (QID) | ORAL | 0 refills | Status: DC | PRN

## 2016-05-23 NOTE — Telephone Encounter (Signed)
Last filled  Xanax 04/24/16 #90-0rf Sig: take 2 tab po tid prn  HYDROcodone-acetaminophen (NORCO): #60-0rf 04/24/16 WGN:FAOZSig:take 1 po q6hr prn  Please advise PC

## 2016-06-20 ENCOUNTER — Encounter: Payer: Self-pay | Admitting: Family Medicine

## 2016-06-20 DIAGNOSIS — F411 Generalized anxiety disorder: Secondary | ICD-10-CM

## 2016-06-20 DIAGNOSIS — N946 Dysmenorrhea, unspecified: Secondary | ICD-10-CM

## 2016-06-23 NOTE — Telephone Encounter (Signed)
Refill both x1 

## 2016-06-25 NOTE — Telephone Encounter (Signed)
Pt called in to follow up on refill request. Informed pt that provider is out of the office today but someone will call her as soon as Rx is ready for pick up.

## 2016-06-27 MED ORDER — HYDROCODONE-ACETAMINOPHEN 10-325 MG PO TABS: 1.0000 | ORAL_TABLET | Freq: Four times a day (QID) | ORAL | 0 refills | Status: DC | PRN

## 2016-06-27 MED ORDER — ALPRAZOLAM 0.5 MG PO TABS: ORAL_TABLET | ORAL | 0 refills | Status: DC

## 2016-06-27 NOTE — Telephone Encounter (Signed)
Pt notified.  Stated she will pick up Rx's on Monday.

## 2016-06-27 NOTE — Telephone Encounter (Signed)
Rx printed and forwarded to PCP for review and signature.   

## 2016-06-27 NOTE — Telephone Encounter (Signed)
Pt called in to see if she could come in to pick up Rx today if possible?    If so, I will call pt to let her know when ready   CB: [phone]

## 2016-07-20 IMAGING — DX DG FOOT COMPLETE 3+V*R*
3 series · 3 of 3 positions shown · non-contrast
Comparison: None.

CLINICAL DATA: Right foot plantar pain no known injury

EXAM:
RIGHT FOOT COMPLETE - 3+ VIEW

[foot ap]
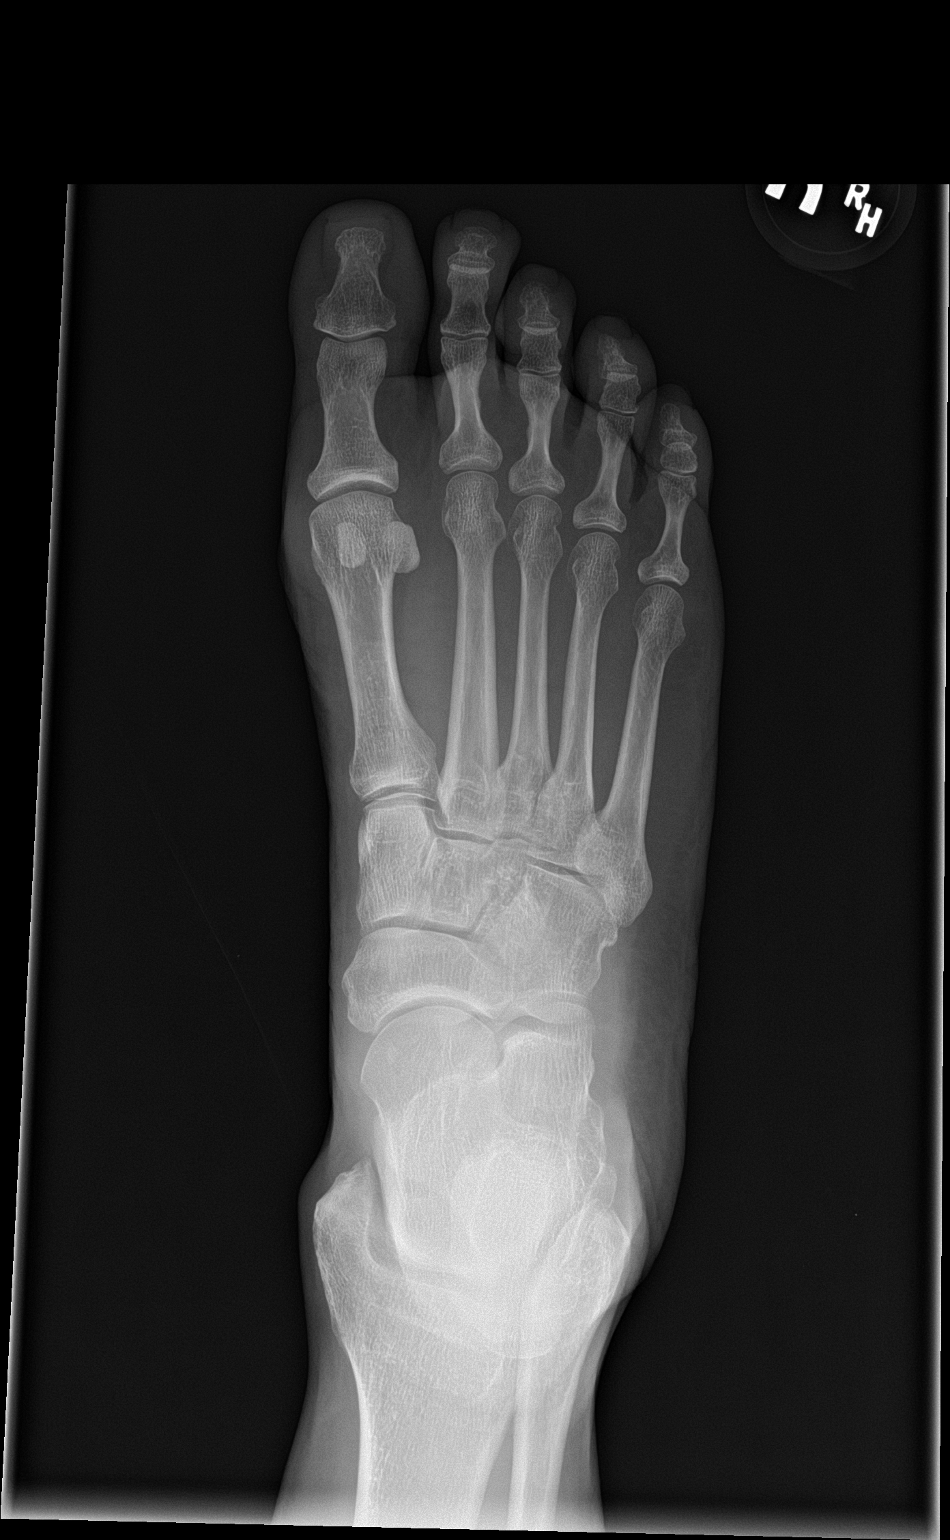

[foot obl]
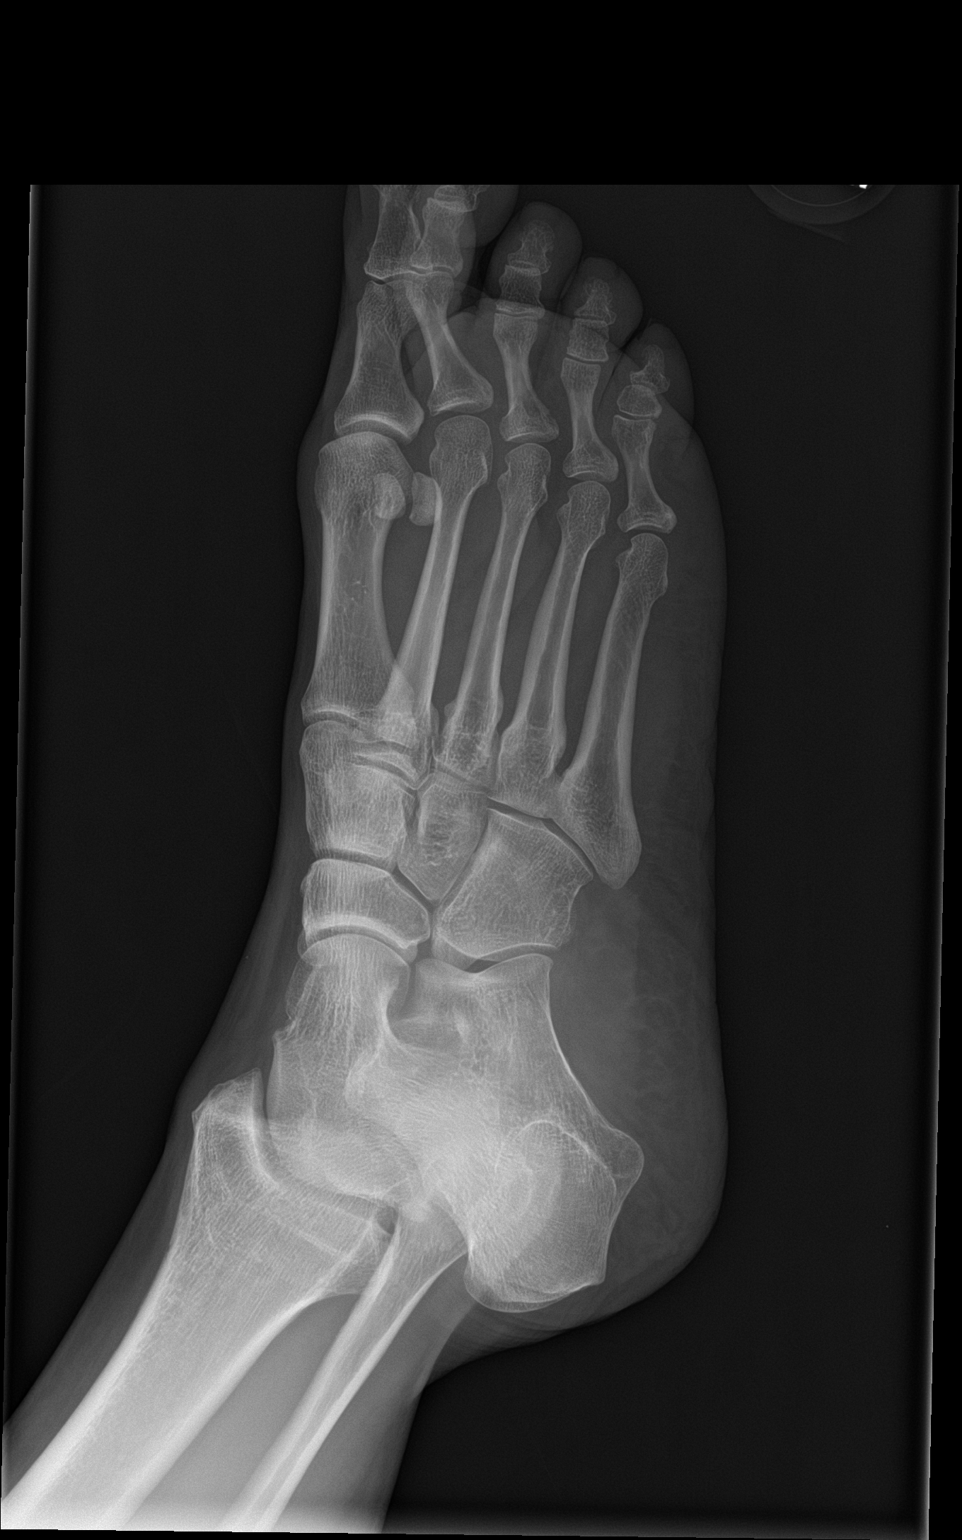

[foot lat]
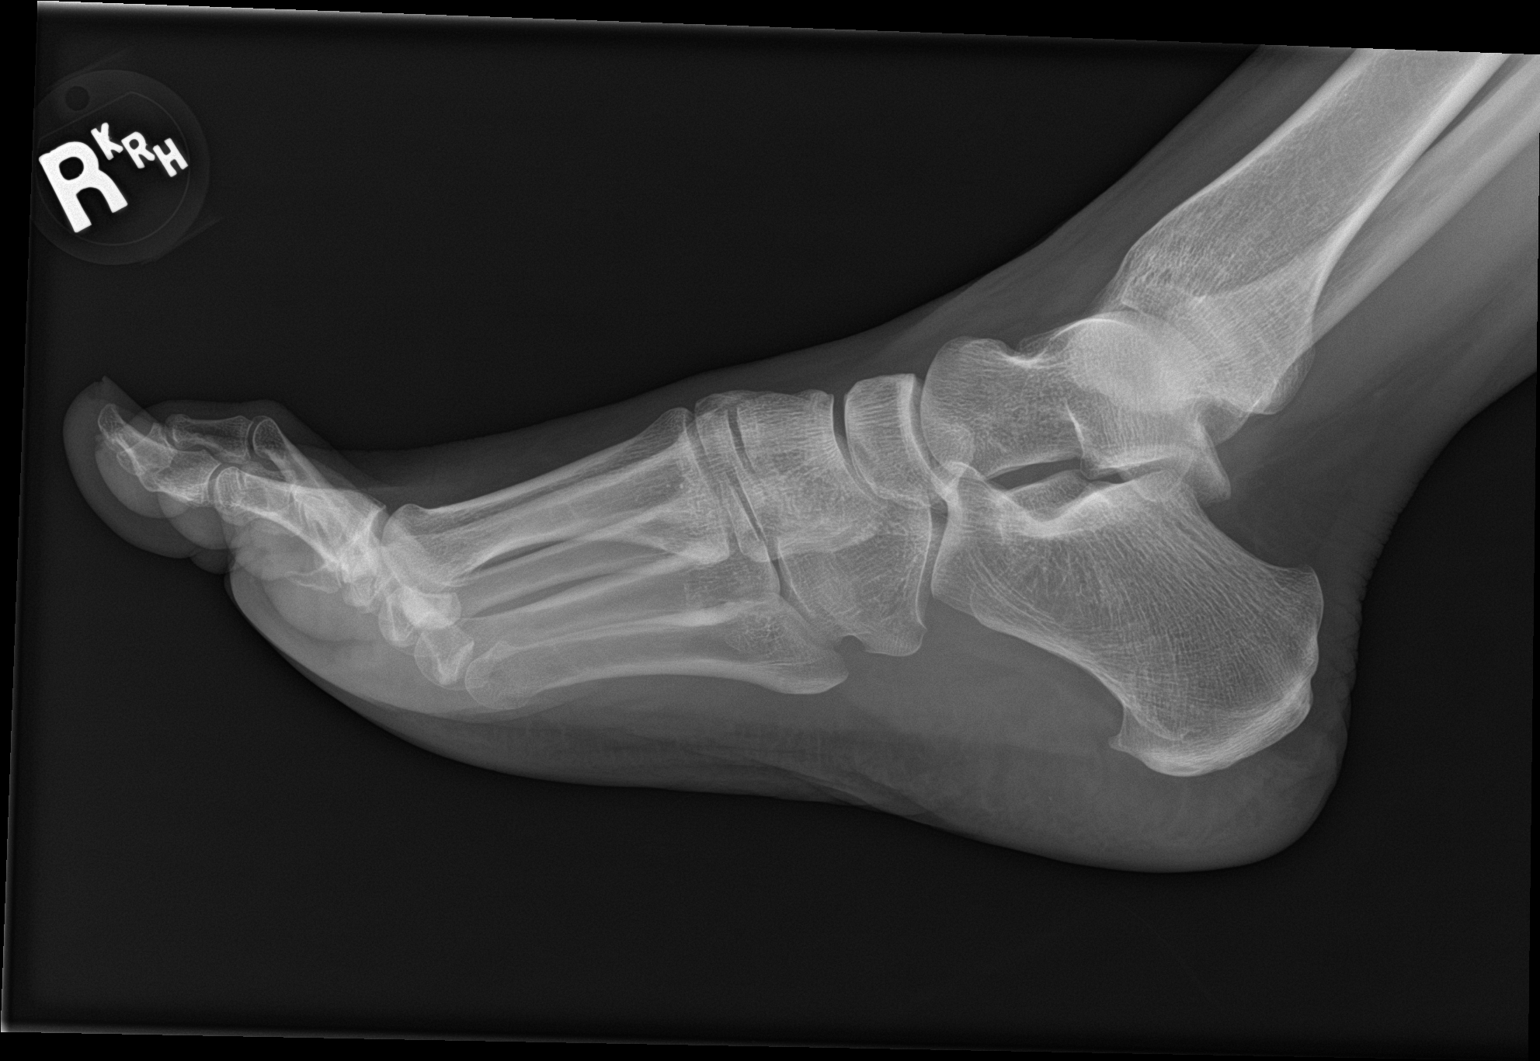

[3 of 3 positions shown; findings below may reference images not displayed]

FINDINGS: Three views of the right foot submitted. No acute fracture or
subluxation. There is small plantar spur of calcaneus.
IMPRESSION: No acute fracture or subluxation.  Small plantar spur of calcaneus.

## 2016-07-28 ENCOUNTER — Other Ambulatory Visit: Payer: Self-pay | Admitting: Family Medicine

## 2016-07-28 DIAGNOSIS — N946 Dysmenorrhea, unspecified: Secondary | ICD-10-CM

## 2016-07-28 DIAGNOSIS — F411 Generalized anxiety disorder: Secondary | ICD-10-CM

## 2016-07-28 NOTE — Telephone Encounter (Signed)
Relation to EX:BMWUpt:self Call back number:[phone]  Reason for call:  Patient requesting a refill HYDROcodone-acetaminophen (NORCO) 10-325 MG tablet and ALPRAZolam (XANAX) 0.5 MG tablet

## 2016-07-29 MED ORDER — ALPRAZOLAM 0.5 MG PO TABS: ORAL_TABLET | ORAL | 0 refills | Status: DC

## 2016-07-29 MED ORDER — HYDROCODONE-ACETAMINOPHEN 10-325 MG PO TABS: 1.0000 | ORAL_TABLET | Freq: Four times a day (QID) | ORAL | 0 refills | Status: DC | PRN

## 2016-07-29 NOTE — Telephone Encounter (Signed)
Fill both x1

## 2016-07-29 NOTE — Telephone Encounter (Signed)
Xanax and hydrocodone  Last filled 06/27/16 Last ov 01/31/16 Needs ov nothing scheduled yet Needs UDS

## 2016-07-29 NOTE — Telephone Encounter (Signed)
Prescriptions printed as PCP instructed. PCP signed Called the patient informed to pickup both Alprazolam and hydrocodone at the front desk.

## 2016-08-02 ENCOUNTER — Other Ambulatory Visit: Payer: Self-pay | Admitting: Family Medicine

## 2016-08-02 DIAGNOSIS — F411 Generalized anxiety disorder: Secondary | ICD-10-CM

## 2016-08-18 ENCOUNTER — Telehealth: Payer: Self-pay | Admitting: Family Medicine

## 2016-08-18 NOTE — Telephone Encounter (Signed)
Caller name: Relationship to patient: Self Can be reached: [phone] Pharmacy:  Reason for call: Refill on ALPRAZolam Prudy Feeler(XANAX) 0.5 MG tablet  HYDROcodone-acetaminophen (NORCO) 10-325 MG tablet [[id number]

## 2016-08-18 NOTE — Telephone Encounter (Signed)
Last filled 07/29/16 hydrocodone #60 and 07/29/16 for alprazolam #60 Last ov 01/31/16 Next ov 09/23/16 Contract 01/31/16 Needs UDS

## 2016-08-19 NOTE — Telephone Encounter (Signed)
Too soon Indication for chronic opioid: chronic pain Medication and dose: hydrocodone10/325 # pills per month: 60 Last UDS date: due Pain contract signed (Y/N): 01/31/2016 Date narcotic database last reviewed (include red flags): 08/19/2016  Pt is 11 days too early for rx

## 2016-08-20 NOTE — Telephone Encounter (Signed)
Left message that rxs were too soon and to call back in about 10 days to refill and to call back with any questions or concerns

## 2016-08-20 NOTE — Telephone Encounter (Signed)
Pt returned call. Advised her that refill request is to soon. Per pcp call back in 10 days to request. Pt says that it usually takes a few days so she was being proactive. Pt expressed understanding and stated that she will call back sooner to time.

## 2016-08-26 ENCOUNTER — Telehealth: Payer: Self-pay | Admitting: Family Medicine

## 2016-08-26 NOTE — Telephone Encounter (Signed)
Caller name: Relationship to patient: Self Can be reached: [phone]  [facility]  [phone] [place] - [address] [phone] (Phone) [phone] (Fax)     Reason for call: Refill HYDROcodone-acetaminophen (NORCO) 10-325 MG tablet [[id number]  ALPRAZolam (XANAX) 0.5 MG tablet

## 2016-08-26 NOTE — Telephone Encounter (Signed)
Requesting:   Alprazolam and Hydrocodone Contract   Signed on 01/31/2016 UDS    Low risk last done on 10/31/2015 Last OV    01/31/2016---next scheduled office visit ison 09/23/2016 Last Refill   Alprazolam  #60 on 07/29/2016                    Hydrocodone  #60 on 07/29/2016  Please Advise

## 2016-08-28 NOTE — Telephone Encounter (Signed)
Let pat know 06/16/16 new law----- and black box warning with xanax and hydrocodone together We will need to stop one and discuss alternative

## 2016-08-28 NOTE — Telephone Encounter (Signed)
Patient informed of PCP instructions and agrees to discuss next month her alternatives at her office appointment.

## 2016-09-16 ENCOUNTER — Ambulatory Visit: Payer: BLUE CROSS/BLUE SHIELD | Admitting: Family Medicine

## 2016-09-23 ENCOUNTER — Ambulatory Visit (INDEPENDENT_AMBULATORY_CARE_PROVIDER_SITE_OTHER): Payer: Self-pay | Admitting: Family Medicine

## 2016-09-23 ENCOUNTER — Encounter: Payer: Self-pay | Admitting: Family Medicine

## 2016-09-23 VITALS — BP 100/70 | HR 80 | Temp 98.5°F | Resp 16 | Ht 61.0 in | Wt 125.6 lb

## 2016-09-23 DIAGNOSIS — F411 Generalized anxiety disorder: Secondary | ICD-10-CM

## 2016-09-23 DIAGNOSIS — G4700 Insomnia, unspecified: Secondary | ICD-10-CM

## 2016-09-23 DIAGNOSIS — N946 Dysmenorrhea, unspecified: Secondary | ICD-10-CM

## 2016-09-23 MED ORDER — HYDROCODONE-ACETAMINOPHEN 10-325 MG PO TABS: 1.0000 | ORAL_TABLET | Freq: Four times a day (QID) | ORAL | 0 refills | Status: DC | PRN

## 2016-09-23 MED ORDER — FLUOXETINE HCL 10 MG PO CAPS: ORAL_CAPSULE | ORAL | 0 refills | Status: DC

## 2016-09-23 MED ORDER — TRAZODONE HCL 50 MG PO TABS: ORAL_TABLET | ORAL | 0 refills | Status: DC

## 2016-09-23 NOTE — Progress Notes (Signed)
Pre visit review using our clinic review tool, if applicable. No additional management support is needed unless otherwise documented below in the visit note. 

## 2016-09-23 NOTE — Patient Instructions (Addendum)
Insomnia Insomnia is a sleep disorder that makes it difficult to fall asleep or to stay asleep. Insomnia can cause tiredness (fatigue), low energy, difficulty concentrating, mood swings, and poor performance at work or school. There are three different ways to classify insomnia:  Difficulty falling asleep.  Difficulty staying asleep.  Waking up too early in the morning. Any type of insomnia can be long-term (chronic) or short-term (acute). Both are common. Short-term insomnia usually lasts for three months or less. Chronic insomnia occurs at least three times a week for longer than three months. What are the causes? Insomnia may be caused by another condition, situation, or substance, such as:  Anxiety.  Certain medicines.  Gastroesophageal reflux disease (GERD) or other gastrointestinal conditions.  Asthma or other breathing conditions.  Restless legs syndrome, sleep apnea, or other sleep disorders.  Chronic pain.  Menopause. This may include hot flashes.  Stroke.  Abuse of alcohol, tobacco, or illegal drugs.  Depression.  Caffeine.  Neurological disorders, such as Alzheimer disease.  An overactive thyroid (hyperthyroidism). The cause of insomnia may not be known. What increases the risk? Risk factors for insomnia include:  Gender. Women are more commonly affected than men.  Age. Insomnia is more common as you get older.  Stress. This may involve your professional or personal life.  Income. Insomnia is more common in people with lower income.  Lack of exercise.  Irregular work schedule or night shifts.  Traveling between different time zones. What are the signs or symptoms? If you have insomnia, trouble falling asleep or trouble staying asleep is the main symptom. This may lead to other symptoms, such as:  Feeling fatigued.  Feeling nervous about going to sleep.  Not feeling rested in the morning.  Having trouble concentrating.  Feeling irritable,  anxious, or depressed. How is this treated? Treatment for insomnia depends on the cause. If your insomnia is caused by an underlying condition, treatment will focus on addressing the condition. Treatment may also include:  Medicines to help you sleep.  Counseling or therapy.  Lifestyle adjustments. Follow these instructions at home:  Take medicines only as directed by your health care provider.  Keep regular sleeping and waking hours. Avoid naps.  Keep a sleep diary to help you and your health care provider figure out what could be causing your insomnia. Include:  When you sleep.  When you wake up during the night.  How well you sleep.  How rested you feel the next day.  Any side effects of medicines you are taking.  What you eat and drink.  Make your bedroom a comfortable place where it is easy to fall asleep:  Put up shades or special blackout curtains to block light from outside.  Use a white noise machine to block noise.  Keep the temperature cool.  Exercise regularly as directed by your health care provider. Avoid exercising right before bedtime.  Use relaxation techniques to manage stress. Ask your health care provider to suggest some techniques that may work well for you. These may include:  Breathing exercises.  Routines to release muscle tension.  Visualizing peaceful scenes.  Cut back on alcohol, caffeinated beverages, and cigarettes, especially close to bedtime. These can disrupt your sleep.  Do not overeat or eat spicy foods right before bedtime. This can lead to digestive discomfort that can make it hard for you to sleep.  Limit screen use before bedtime. This includes:  Watching TV.  Using your smartphone, tablet, and computer.  Stick to a   routine. This can help you fall asleep faster. Try to do a quiet activity, brush your teeth, and go to bed at the same time each night.  Get out of bed if you are still awake after 15 minutes of trying to  sleep. Keep the lights down, but try reading or doing a quiet activity. When you feel sleepy, go back to bed.  Make sure that you drive carefully. Avoid driving if you feel very sleepy.  Keep all follow-up appointments as directed by your health care provider. This is important. Contact a health care provider if:  You are tired throughout the day or have trouble in your daily routine due to sleepiness.  You continue to have sleep problems or your sleep problems get worse. Get help right away if:  You have serious thoughts about hurting yourself or someone else. This information is not intended to replace advice given to you by your health care provider. Make sure you discuss any questions you have with your health care provider. Document Released: 05/30/2000 Document Revised: 11/02/2015 Document Reviewed: 03/03/2014 Elsevier Interactive Patient Education  2017 [employer]   Generalized Anxiety Disorder, Adult Generalized anxiety disorder (GAD) is a mental health disorder. People with this condition constantly worry about everyday events. Unlike normal anxiety, worry related to GAD is not triggered by a specific event. These worries also do not fade or get better with time. GAD interferes with life functions, including relationships, work, and school. GAD can vary from mild to severe. People with severe GAD can have intense waves of anxiety with physical symptoms (panic attacks). What are the causes? The exact cause of GAD is not known. What increases the risk? This condition is more likely to develop in:  Women.  People who have a family history of anxiety disorders.  People who are very shy.  People who experience very stressful life events, such as the death of a loved one.  People who have a very stressful family environment. What are the signs or symptoms? People with GAD often worry excessively about many things in their lives, such as their health and family. They may  also be overly concerned about:  Doing well at work.  Being on time.  Natural disasters.  Friendships. Physical symptoms of GAD include:  Fatigue.  Muscle tension or having muscle twitches.  Trembling or feeling shaky.  Being easily startled.  Feeling like your heart is pounding or racing.  Feeling out of breath or like you cannot take a deep breath.  Having trouble falling asleep or staying asleep.  Sweating.  Nausea, diarrhea, or irritable bowel syndrome (IBS).  Headaches.  Trouble concentrating or remembering facts.  Restlessness.  Irritability. How is this diagnosed? Your health care provider can diagnose GAD based on your symptoms and medical history. You will also have a physical exam. The health care provider will ask specific questions about your symptoms, including how severe they are, when they started, and if they come and go. Your health care provider may ask you about your use of alcohol or drugs, including prescription medicines. Your health care provider may refer you to a mental health specialist for further evaluation. Your health care provider will do a thorough examination and may perform additional tests to rule out other possible causes of your symptoms. To be diagnosed with GAD, a person must have anxiety that:  Is out of his or her control.  Affects several different aspects of his or her life, such as work and relationships.  Causes distress that makes him or her unable to take part in normal activities.  Includes at least three physical symptoms of GAD, such as restlessness, fatigue, trouble concentrating, irritability, muscle tension, or sleep problems. Before your health care provider can confirm a diagnosis of GAD, these symptoms must be present more days than they are not, and they must last for six months or longer. How is this treated? The following therapies are usually used to treat GAD:  Medicine. Antidepressant medicine is usually  prescribed for long-term daily control. Antianxiety medicines may be added in severe cases, especially when panic attacks occur.  Talk therapy (psychotherapy). Certain types of talk therapy can be helpful in treating GAD by providing support, education, and guidance. Options include:  Cognitive behavioral therapy (CBT). People learn coping skills and techniques to ease their anxiety. They learn to identify unrealistic or negative thoughts and behaviors and to replace them with positive ones.  Acceptance and commitment therapy (ACT). This treatment teaches people how to be mindful as a way to cope with unwanted thoughts and feelings.  Biofeedback. This process trains you to manage your body's response (physiological response) through breathing techniques and relaxation methods. You will work with a therapist while machines are used to monitor your physical symptoms.  Stress management techniques. These include yoga, meditation, and exercise. A mental health specialist can help determine which treatment is best for you. Some people see improvement with one type of therapy. However, other people require a combination of therapies. Follow these instructions at home:  Take over-the-counter and prescription medicines only as told by your health care provider.  Try to maintain a normal routine.  Try to anticipate stressful situations and allow extra time to manage them.  Practice any stress management or self-calming techniques as taught by your health care provider.  Do not punish yourself for setbacks or for not making progress.  Try to recognize your accomplishments, even if they are small.  Keep all follow-up visits as told by your health care provider. This is important. Contact a health care provider if:  Your symptoms do not get better.  Your symptoms get worse.  You have signs of depression, such as:  A persistently sad, cranky, or irritable mood.  Loss of enjoyment in  activities that used to bring you joy.  Change in weight or eating.  Changes in sleeping habits.  Avoiding friends or family members.  Loss of energy for normal tasks.  Feelings of guilt or worthlessness. Get help right away if:  You have serious thoughts about hurting yourself or others. If you ever feel like you may hurt yourself or others, or have thoughts about taking your own life, get help right away. You can go to your nearest emergency department or call:  Your local emergency services (911 in the [place]  A suicide crisis helpline, such as the National Suicide Prevention Lifeline at 737 340 9863. This is open 24 hours a day. Summary  Generalized anxiety disorder (GAD) is a mental health disorder that involves worry that is not triggered by a specific event.  People with GAD often worry excessively about many things in their lives, such as their health and family.  GAD may cause physical symptoms such as restlessness, trouble concentrating, sleep problems, frequent sweating, nausea, diarrhea, headaches, and trembling or muscle twitching.  A mental health specialist can help determine which treatment is best for you. Some people see improvement with one type of therapy. However, other people require a combination of therapies. This  information is not intended to replace advice given to you by your health care provider. Make sure you discuss any questions you have with your health care provider. Document Released: 09/27/2012 Document Revised: 04/22/2016 Document Reviewed: 04/22/2016 Elsevier Interactive Patient Education  2017 [employer].

## 2016-09-23 NOTE — Progress Notes (Signed)
Subjective:  I acted as a Neurosurgeon for Dr. Delman Kitten, LPN    Patient ID: Amy Wiley, female    DOB: 08/05/1970, 46 y.o.   MRN: [id number]  Chief Complaint  Patient presents with  . Anxiety    follow up    Anxiety  Presents for follow-up visit. Patient reports no chest pain or palpitations.      Patient is in today for medication follow up.   Patient Care Team: Donato Schultz, DO as PCP - General   Past Medical History:  Diagnosis Date  . Abnormal Pap smear   . ADD (attention deficit disorder)   . Anxiety     Past Surgical History:  Procedure Laterality Date  . WISDOM TOOTH EXTRACTION      Family History  Problem Relation Age of Onset  . Heart attack Paternal Grandmother   . Heart disease Paternal Grandmother   . ALS Paternal Grandmother   . Heart attack Paternal Uncle   . Heart disease Paternal Uncle 82    MI  . Hypertension Father   . Heart disease Paternal Grandfather     Social History   Social History  . Marital status: Married    Spouse name: N/A  . Number of children: N/A  . Years of education: N/A   Occupational History  . [employer]     cpa   Social History Main Topics  . Smoking status: Current Every Day Smoker    Types: Cigarettes  . Smokeless tobacco: Never Used     Comment: 1-2 cig a day  . Alcohol use No  . Drug use: No  . Sexual activity: Yes    Partners: Male    Birth control/ protection: IUD     Comment: Mirena    Other Topics Concern  . Not on file   Social History Narrative   Exercise-- walks dog qd -- 2 miles    Outpatient Medications Prior to Visit  Medication Sig Dispense Refill  . ALPRAZolam (XANAX) 0.5 MG tablet TAKE 1 TABLET BY MOUTH 3 TIMES A DAY AS NEEDED 60 tablet 0  . doxylamine, Sleep, (SLEEP AID) 25 MG tablet Take 25 mg by mouth at bedtime as needed.    Marland Kitchen levonorgestrel (MIRENA) 20 MCG/24HR IUD 1 each by Intrauterine route once.      Marland Kitchen FLUoxetine (PROZAC) 10 MG capsule TAKE 1  CAPSULE(10 MG) BY MOUTH DAILY 30 capsule 0  . HYDROcodone-acetaminophen (NORCO) 10-325 MG tablet Take 1 tablet by mouth every 6 (six) hours as needed. 60 tablet 0   No facility-administered medications prior to visit.     Allergies  Allergen Reactions  . Varenicline Tartrate     REACTION: NV    Review of Systems  Constitutional: Negative for fever.  HENT: Negative for congestion.   Eyes: Negative for blurred vision.  Respiratory: Negative for cough.   Cardiovascular: Negative for chest pain and palpitations.  Gastrointestinal: Negative for vomiting.  Musculoskeletal: Negative for back pain.  Skin: Negative for rash.  Neurological: Negative for loss of consciousness and headaches.       Objective:    Physical Exam  Constitutional: She is oriented to person, place, and time. She appears well-developed and well-nourished. No distress.  HENT:  Head: Normocephalic and atraumatic.  Eyes: Conjunctivae are normal. Pupils are equal, round, and reactive to light.  Neck: Normal range of motion. No thyromegaly present.  Cardiovascular: Normal rate and regular rhythm.   Pulmonary/Chest: Effort normal and  breath sounds normal. She has no wheezes.  Abdominal: Soft. Bowel sounds are normal. There is no tenderness.  Musculoskeletal: Normal range of motion. She exhibits no edema or deformity.  Neurological: She is alert and oriented to person, place, and time.  Skin: Skin is warm and dry. She is not diaphoretic.  Psychiatric: She has a normal mood and affect.    BP 100/70 (BP Location: Left Arm, Patient Position: Sitting, Cuff Size: Normal)   Pulse 80   Temp 98.5 F (36.9 C) (Oral)   Resp 16   Ht  (1.549 m)   Wt 125 lb 9.6 oz (57 kg)   SpO2 95%   BMI 23.73 kg/m  Wt Readings from Last 3 Encounters:  09/23/16 125 lb 9.6 oz (57 kg)  01/31/16 117 lb (53.1 kg)  08/02/15 123 lb (55.8 kg)   BP Readings from Last 3 Encounters:  09/23/16 100/70  01/31/16 110/68  08/02/15 110/70       Immunization History  Administered Date(s) Administered  . Influenza Whole 05/05/2008  . Tdap 02/08/2013    Health Maintenance  Topic Date Due  . HIV Screening  03/20/1985  . MAMMOGRAM  09/15/2015  . INFLUENZA VACCINE  01/14/2017  . PAP SMEAR  09/14/2017  . TETANUS/TDAP  02/09/2023    Lab Results  Component Value Date   WBC 8.2 02/08/2013   HGB 12.6 02/08/2013   HCT 37.0 02/08/2013   PLT 243.0 02/08/2013   GLUCOSE 85 02/08/2013   CHOL 198 02/08/2013   TRIG 100.0 02/08/2013   HDL 60.00 02/08/2013   LDLCALC 118 (H) 02/08/2013   ALT 23 02/08/2013   AST 17 02/08/2013   NA 136 02/08/2013   K 3.6 02/08/2013   CL 104 02/08/2013   CREATININE 0.7 02/08/2013   BUN 9 02/08/2013   CO2 29 02/08/2013   TSH 0.77 02/08/2013    Lab Results  Component Value Date   TSH 0.77 02/08/2013   Lab Results  Component Value Date   WBC 8.2 02/08/2013   HGB 12.6 02/08/2013   HCT 37.0 02/08/2013   MCV 88.9 02/08/2013   PLT 243.0 02/08/2013   Lab Results  Component Value Date   NA 136 02/08/2013   K 3.6 02/08/2013   CO2 29 02/08/2013   GLUCOSE 85 02/08/2013   BUN 9 02/08/2013   CREATININE 0.7 02/08/2013   BILITOT 0.4 02/08/2013   ALKPHOS 43 02/08/2013   AST 17 02/08/2013   ALT 23 02/08/2013   PROT 7.2 02/08/2013   ALBUMIN 4.2 02/08/2013   CALCIUM 8.9 02/08/2013   GFR 95.54 02/08/2013   Lab Results  Component Value Date   CHOL 198 02/08/2013   Lab Results  Component Value Date   HDL 60.00 02/08/2013   Lab Results  Component Value Date   LDLCALC 118 (H) 02/08/2013   Lab Results  Component Value Date   TRIG 100.0 02/08/2013   Lab Results  Component Value Date   CHOLHDL 3 02/08/2013   No results found for: HGBA1C       Assessment & Plan:   Problem List Items Addressed This Visit    None    Visit Diagnoses    Insomnia, unspecified type    -  Primary   Relevant Medications   traZODone (DESYREL) 50 MG tablet   Dysmenorrhea       Relevant Medications    HYDROcodone-acetaminophen (NORCO) 10-325 MG tablet   Generalized anxiety disorder       Relevant Medications  FLUoxetine (PROZAC) 10 MG capsule      I am having Ms. Amador start on traZODone. I am also having her maintain her levonorgestrel, doxylamine (Sleep), ALPRAZolam, HYDROcodone-acetaminophen, and FLUoxetine.  Meds ordered this encounter  Medications  . traZODone (DESYREL) 50 MG tablet    Sig: Take 25-50 mg PO qhs sleep    Dispense:  30 tablet    Refill:  0  . HYDROcodone-acetaminophen (NORCO) 10-325 MG tablet    Sig: Take 1 tablet by mouth every 6 (six) hours as needed.    Dispense:  60 tablet    Refill:  0  . FLUoxetine (PROZAC) 10 MG capsule    Sig: TAKE 1 CAPSULE(10 MG) BY MOUTH DAILY    Dispense:  30 capsule    Refill:  0    CMA served as scribe during this visit. History, Physical and Plan performed by medical provider. Documentation and orders reviewed and attested to.  Donato Schultz, DO   Patient ID: Irina Okelly, female   DOB: 02-02-1971, 46 y.o.   MRN: [id number]

## 2016-09-25 ENCOUNTER — Encounter: Payer: Self-pay | Admitting: Family Medicine

## 2016-10-20 ENCOUNTER — Encounter: Payer: Self-pay | Admitting: Family Medicine

## 2016-10-20 DIAGNOSIS — N946 Dysmenorrhea, unspecified: Secondary | ICD-10-CM

## 2016-10-21 ENCOUNTER — Other Ambulatory Visit: Payer: Self-pay | Admitting: Family Medicine

## 2016-10-21 NOTE — Telephone Encounter (Signed)
Refill x1 

## 2016-10-23 MED ORDER — HYDROCODONE-ACETAMINOPHEN 10-325 MG PO TABS: 1.0000 | ORAL_TABLET | Freq: Four times a day (QID) | ORAL | 0 refills | Status: DC | PRN

## 2016-11-20 ENCOUNTER — Other Ambulatory Visit: Payer: Self-pay | Admitting: Family Medicine

## 2016-11-20 ENCOUNTER — Encounter: Payer: Self-pay | Admitting: Family Medicine

## 2016-11-20 DIAGNOSIS — G4700 Insomnia, unspecified: Secondary | ICD-10-CM

## 2016-11-20 DIAGNOSIS — N946 Dysmenorrhea, unspecified: Secondary | ICD-10-CM

## 2016-11-21 MED ORDER — HYDROCODONE-ACETAMINOPHEN 10-325 MG PO TABS: 1.0000 | ORAL_TABLET | Freq: Four times a day (QID) | ORAL | 0 refills | Status: DC | PRN

## 2016-11-21 NOTE — Telephone Encounter (Signed)
Refill x1 Indication for chronic opioid: chronic pain Medication and dose: oxy 5/325 # pills per month: 60 Last UDS date: 10/2016 Pain contract signed (Y/N): y Date narcotic database last reviewed (include red flags): 08/19/2016

## 2016-11-21 NOTE — Telephone Encounter (Signed)
Requesting:   trazodone Contract    01/31/16 UDS   09/23/16 Last OV    09/23/2016 Last Refill   #30 on 09/23/16  Please Advise

## 2016-12-19 ENCOUNTER — Other Ambulatory Visit: Payer: Self-pay | Admitting: Family Medicine

## 2016-12-19 ENCOUNTER — Encounter: Payer: Self-pay | Admitting: Family Medicine

## 2016-12-19 DIAGNOSIS — N946 Dysmenorrhea, unspecified: Secondary | ICD-10-CM

## 2016-12-19 DIAGNOSIS — G4700 Insomnia, unspecified: Secondary | ICD-10-CM

## 2016-12-19 NOTE — Telephone Encounter (Signed)
Patient request for hydrocodone  Last filled per database: 11/21/16  Last written: 11/21/16  #60 Last ov: 09/23/16 Next ov: No F/U plan on file Contract: 09/23/16 UDS: 09/23/16  Database on desk and UDS reprinted need to put a risk on it and send back to Amy

## 2016-12-21 NOTE — Telephone Encounter (Signed)
database 

## 2016-12-22 NOTE — Telephone Encounter (Signed)
Requesting:   trazodone Contract   01/31/16 UDS   09/23/16 Last OV   09/23/16 Last Refill    #30 no refills on 11/21/16  Please Advise

## 2016-12-24 NOTE — Telephone Encounter (Signed)
Patient called to check the status of this refill

## 2016-12-24 NOTE — Telephone Encounter (Signed)
Database ran again and on your desk.

## 2016-12-24 NOTE — Telephone Encounter (Signed)
That was yesterday If database look ok ok to print

## 2016-12-29 NOTE — Telephone Encounter (Signed)
Pt called in to follow up on refill request. Pt would like to come in to pick up tomorrow if possible.

## 2016-12-31 NOTE — Telephone Encounter (Signed)
Pt called in to follow up on refill request. Pt says that she need Rx. Advised pt that I will look into it further and give her a call back    Please assist further.    CB: [phone]

## 2017-01-09 MED ORDER — HYDROCODONE-ACETAMINOPHEN 10-325 MG PO TABS: 1.0000 | ORAL_TABLET | Freq: Four times a day (QID) | ORAL | 0 refills | Status: DC | PRN

## 2017-01-19 ENCOUNTER — Other Ambulatory Visit: Payer: Self-pay | Admitting: Family Medicine

## 2017-01-19 DIAGNOSIS — G4700 Insomnia, unspecified: Secondary | ICD-10-CM

## 2017-01-19 NOTE — Telephone Encounter (Signed)
Requesting:    trazodone Contract     01/31/2016 UDS    Low risk next is due on 03/25/2017 Last OV       09/23/2016 Last Refill   #30 on  12/22/2016  Please Advise

## 2017-02-04 ENCOUNTER — Encounter: Payer: Self-pay | Admitting: Family Medicine

## 2017-02-04 DIAGNOSIS — N946 Dysmenorrhea, unspecified: Secondary | ICD-10-CM

## 2017-02-06 MED ORDER — HYDROCODONE-ACETAMINOPHEN 10-325 MG PO TABS: 1.0000 | ORAL_TABLET | Freq: Four times a day (QID) | ORAL | 0 refills | Status: DC | PRN

## 2017-02-06 NOTE — Telephone Encounter (Signed)
Refill x1 

## 2017-02-06 NOTE — Telephone Encounter (Signed)
Rx printed, awaiting DO signature.  

## 2017-02-06 NOTE — Telephone Encounter (Signed)
Pt is requesting refill on Hydrocodone.   Last OV: 09/23/2016 Last Fill: 01/09/2017 #60 and 0RF (Pt sig: 1 tab q6h prn) UDS: 09/23/2016 Low risk  [place] database printed 12/30/2016 (in media)   Please adivse.

## 2017-02-06 NOTE — Telephone Encounter (Signed)
Rx placed at front desk for pick up at Pt's convenience. Pt informed via MyChart.  

## 2017-02-16 ENCOUNTER — Other Ambulatory Visit: Payer: Self-pay | Admitting: Family Medicine

## 2017-02-16 DIAGNOSIS — G4700 Insomnia, unspecified: Secondary | ICD-10-CM

## 2017-03-07 ENCOUNTER — Encounter: Payer: Self-pay | Admitting: Family Medicine

## 2017-03-09 NOTE — Telephone Encounter (Signed)
Pt is requesting refill on Hydrocodone.   Last OV: 09/23/2016  Last Fill: 02/06/2017 #60 and 0RF UDS: 09/23/2016 Low risk  [facility] in media from 12/30/2016  Please avdise.

## 2017-03-09 NOTE — Telephone Encounter (Signed)
Let her know we are going to start seeing people with controlled sub q55m and I can give her 3 months at a time -- can she come in tomorrow?  Or I'm working wed 10am -[phone]

## 2017-03-10 ENCOUNTER — Encounter: Payer: Self-pay | Admitting: Family Medicine

## 2017-03-10 ENCOUNTER — Ambulatory Visit (INDEPENDENT_AMBULATORY_CARE_PROVIDER_SITE_OTHER): Payer: Self-pay | Admitting: Family Medicine

## 2017-03-10 VITALS — BP 114/66 | Wt 119.0 lb

## 2017-03-10 DIAGNOSIS — N946 Dysmenorrhea, unspecified: Secondary | ICD-10-CM

## 2017-03-10 DIAGNOSIS — M545 Low back pain: Secondary | ICD-10-CM

## 2017-03-10 DIAGNOSIS — G8929 Other chronic pain: Secondary | ICD-10-CM

## 2017-03-10 MED ORDER — HYDROCODONE-ACETAMINOPHEN 10-325 MG PO TABS: 1.0000 | ORAL_TABLET | Freq: Four times a day (QID) | ORAL | 0 refills | Status: DC | PRN

## 2017-03-10 MED ORDER — HYDROCODONE-ACETAMINOPHEN 10-325 MG PO TABS: 1.0000 | ORAL_TABLET | Freq: Three times a day (TID) | ORAL | 0 refills | Status: DC | PRN

## 2017-03-10 NOTE — Patient Instructions (Signed)

## 2017-03-10 NOTE — Progress Notes (Signed)
Patient ID: Amy Wiley, female    DOB: [birth year]  Age: 46 y.o. MRN: [id number]    Subjective:  Subjective  HPI Amy Wiley presents for f/u pain meds.   Indication for chronic opioid: back pain Medication and dose: oxy 10/325 # pills per month: 30 Last UDS date: 4//12/18 Pain contract signed (Y/N): 09/23/16 Date narcotic database last reviewed (include red flags): data base down-- will check tomorrow  Review of Systems  Constitutional: Negative for appetite change, diaphoresis, fatigue and unexpected weight change.  Eyes: Negative for pain, redness and visual disturbance.  Respiratory: Negative for cough, chest tightness, shortness of breath and wheezing.   Cardiovascular: Negative for chest pain, palpitations and leg swelling.  Endocrine: Negative for cold intolerance, heat intolerance, polydipsia, polyphagia and polyuria.  Genitourinary: Negative for difficulty urinating, dysuria and frequency.  Neurological: Negative for dizziness, light-headedness, numbness and headaches.    History Past Medical History:  Diagnosis Date  . Abnormal Pap smear   . ADD (attention deficit disorder)   . Anxiety     She has a past surgical history that includes Wisdom tooth extraction.   Her family history includes ALS in her paternal grandmother; Heart attack in her paternal grandmother and paternal uncle; Heart disease in her paternal grandfather and paternal grandmother; Heart disease (age of onset: 9) in her paternal uncle; Hypertension in her father.She reports that she has been smoking Cigarettes.  She has never used smokeless tobacco. She reports that she does not drink alcohol or use drugs.  Current Outpatient Prescriptions on File Prior to Visit  Medication Sig Dispense Refill  . FLUoxetine (PROZAC) 10 MG capsule TAKE 1 CAPSULE(10 MG) BY MOUTH DAILY 30 capsule 0  . levonorgestrel (MIRENA) 20 MCG/24HR IUD 1 each by Intrauterine route once.      . traZODone (DESYREL) 50 MG tablet  TAKE 1/2 TO 1 TABLET BY MOUTH EVERY NIGHT AT BEDTIME FOR SLEEP 30 tablet 0   No current facility-administered medications on file prior to visit.      Objective:  Objective  Physical Exam  Constitutional: She is oriented to person, place, and time. She appears well-developed and well-nourished.  HENT:  Head: Normocephalic and atraumatic.  Eyes: Conjunctivae and EOM are normal.  Neck: Normal range of motion. Neck supple. No JVD present. Carotid bruit is not present. No thyromegaly present.  Cardiovascular: Normal rate, regular rhythm and normal heart sounds.   No murmur heard. Pulmonary/Chest: Effort normal and breath sounds normal. No respiratory distress. She has no wheezes. She has no rales. She exhibits no tenderness.  Musculoskeletal: She exhibits no edema.  Neurological: She is alert and oriented to person, place, and time.  Psychiatric: She has a normal mood and affect.  Nursing note and vitals reviewed.  BP 114/66   Wt 119 lb (54 kg)   BMI 22.48 kg/m  Wt Readings from Last 3 Encounters:  03/10/17 119 lb (54 kg)  09/23/16 125 lb 9.6 oz (57 kg)  01/31/16 117 lb (53.1 kg)     Lab Results  Component Value Date   WBC 8.2 02/08/2013   HGB 12.6 02/08/2013   HCT 37.0 02/08/2013   PLT 243.0 02/08/2013   GLUCOSE 85 02/08/2013   CHOL 198 02/08/2013   TRIG 100.0 02/08/2013   HDL 60.00 02/08/2013   LDLCALC 118 (H) 02/08/2013   ALT 23 02/08/2013   AST 17 02/08/2013   NA 136 02/08/2013   K 3.6 02/08/2013   CL 104 02/08/2013   CREATININE 0.7 02/08/2013  BUN 9 02/08/2013   CO2 29 02/08/2013   TSH 0.77 02/08/2013    Dg Foot Complete Right  Result Date: 08/02/2015 CLINICAL DATA:  Right foot plantar pain no known injury EXAM: RIGHT FOOT COMPLETE - 3+ VIEW COMPARISON:  None. FINDINGS: Three views of the right foot submitted. No acute fracture or subluxation. There is small plantar spur of calcaneus. IMPRESSION: No acute fracture or subluxation.  Small plantar spur of  calcaneus. Electronically Signed   By: Amy Wiley M.D.   On: 08/02/2015 14:59     Assessment & Plan:  Plan  I have discontinued Amy Wiley's doxylamine (Sleep). I am also having her start on HYDROcodone-acetaminophen and HYDROcodone-acetaminophen. Additionally, I am having her maintain her levonorgestrel, FLUoxetine, traZODone, and HYDROcodone-acetaminophen.  Meds ordered this encounter  Medications  . HYDROcodone-acetaminophen (NORCO) 10-325 MG tablet    Sig: Take 1 tablet by mouth every 6 (six) hours as needed.    Dispense:  60 tablet    Refill:  0  . HYDROcodone-acetaminophen (NORCO) 10-325 MG tablet    Sig: Take 1 tablet by mouth every 8 (eight) hours as needed.    Dispense:  30 tablet    Refill:  0    Do not fill until 10/ 25/ 2018  . HYDROcodone-acetaminophen (NORCO) 10-325 MG tablet    Sig: Take 1 tablet by mouth every 8 (eight) hours as needed.    Dispense:  30 tablet    Refill:  0    Do not fill until May 10, 2017    Problem List Items Addressed This Visit      Unprioritized   Chronic back pain - Primary   Relevant Medications   HYDROcodone-acetaminophen (NORCO) 10-325 MG tablet   HYDROcodone-acetaminophen (NORCO) 10-325 MG tablet   HYDROcodone-acetaminophen (NORCO) 10-325 MG tablet    Other Visit Diagnoses    Dysmenorrhea        pt only taking meds occasionally  rto 3 months  Follow-up: Return in about 3 months (around 06/09/2017).  Donato Schultz, DO

## 2017-03-16 ENCOUNTER — Other Ambulatory Visit: Payer: Self-pay | Admitting: Family Medicine

## 2017-03-16 DIAGNOSIS — G4700 Insomnia, unspecified: Secondary | ICD-10-CM

## 2017-06-18 ENCOUNTER — Encounter: Payer: Self-pay | Admitting: Family Medicine

## 2017-06-18 ENCOUNTER — Ambulatory Visit (INDEPENDENT_AMBULATORY_CARE_PROVIDER_SITE_OTHER): Payer: Self-pay | Admitting: Family Medicine

## 2017-06-18 VITALS — BP 118/80 | HR 80 | Temp 98.2°F | Ht 62.0 in | Wt 118.0 lb

## 2017-06-18 DIAGNOSIS — G8929 Other chronic pain: Secondary | ICD-10-CM

## 2017-06-18 DIAGNOSIS — M545 Low back pain: Secondary | ICD-10-CM

## 2017-06-18 DIAGNOSIS — G4700 Insomnia, unspecified: Secondary | ICD-10-CM

## 2017-06-18 DIAGNOSIS — Z79899 Other long term (current) drug therapy: Secondary | ICD-10-CM

## 2017-06-18 MED ORDER — HYDROCODONE-ACETAMINOPHEN 10-325 MG PO TABS: 1.0000 | ORAL_TABLET | Freq: Four times a day (QID) | ORAL | 0 refills | Status: DC | PRN

## 2017-06-18 MED ORDER — TRAZODONE HCL 50 MG PO TABS: ORAL_TABLET | ORAL | 2 refills | Status: DC

## 2017-06-18 MED ORDER — HYDROCODONE-ACETAMINOPHEN 10-325 MG PO TABS: 1.0000 | ORAL_TABLET | Freq: Three times a day (TID) | ORAL | 0 refills | Status: DC | PRN

## 2017-06-18 NOTE — Progress Notes (Signed)
Subjective:  I acted as a Neurosurgeon for [employer], CMA   Patient ID: Amy Wiley, female    DOB: 15-Dec-1970, 47 y.o.   MRN: [id number]  Chief Complaint  Patient presents with  . Medication Refill    HPI  Patient is in today for medication refills.  No complaintsl   Patient Care Team: Zola Button, Grayling Congress, DO as PCP - General   Past Medical History:  Diagnosis Date  . Abnormal Pap smear   . ADD (attention deficit disorder)   . Anxiety     Past Surgical History:  Procedure Laterality Date  . WISDOM TOOTH EXTRACTION      Family History  Problem Relation Age of Onset  . Heart attack Paternal Grandmother   . Heart disease Paternal Grandmother   . ALS Paternal Grandmother   . Heart attack Paternal Uncle   . Heart disease Paternal Uncle 63       MI  . Hypertension Father   . Heart disease Paternal Grandfather     Social History   Socioeconomic History  . Marital status: Married    Spouse name: Not on file  . Number of children: Not on file  . Years of education: Not on file  . Highest education level: Not on file  Social Needs  . Financial resource strain: Not on file  . Food insecurity - worry: Not on file  . Food insecurity - inability: Not on file  . Transportation needs - medical: Not on file  . Transportation needs - non-medical: Not on file  Occupational History  . 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 Smoking status: Current Every Day Smoker    Types: Cigarettes  . Smokeless tobacco: Never Used  . Tobacco comment: 1-2 cig a day  Substance and Sexual Activity  . Alcohol use: No  . Drug use: No  . Sexual activity: Yes    Partners: Male    Birth control/protection: IUD    Comment: Mirena   Other Topics Concern  . Not on file  Social History Narrative   Exercise-- walks dog qd -- 2 miles    Outpatient Medications Prior to Visit  Medication Sig Dispense Refill  . FLUoxetine (PROZAC) 10 MG capsule  TAKE 1 CAPSULE(10 MG) BY MOUTH DAILY 30 capsule 0  . levonorgestrel (MIRENA) 20 MCG/24HR IUD 1 each by Intrauterine route once.      Marland Kitchen HYDROcodone-acetaminophen (NORCO) 10-325 MG tablet Take 1 tablet by mouth every 6 (six) hours as needed. 60 tablet 0  . HYDROcodone-acetaminophen (NORCO) 10-325 MG tablet Take 1 tablet by mouth every 8 (eight) hours as needed. 30 tablet 0  . HYDROcodone-acetaminophen (NORCO) 10-325 MG tablet Take 1 tablet by mouth every 8 (eight) hours as needed. 30 tablet 0  . traZODone (DESYREL) 50 MG tablet TAKE 1/2 TO 1 TABLET BY MOUTH EVERY NIGHT AT BEDTIME FOR SLEEP 30 tablet 2   No facility-administered medications prior to visit.     Allergies  Allergen Reactions  . Varenicline Tartrate     REACTION: NV    Review of Systems  Constitutional: Negative for chills, fever and malaise/fatigue.  HENT: Negative for congestion and hearing loss.   Eyes: Negative for discharge.  Respiratory: Negative for cough, sputum production and shortness of breath.   Cardiovascular: Negative for chest pain, palpitations and leg swelling.  Gastrointestinal: Negative for abdominal pain, blood in stool, constipation, diarrhea, heartburn, nausea and vomiting.  Genitourinary:  Negative for dysuria, frequency, hematuria and urgency.  Musculoskeletal: Negative for back pain, falls and myalgias.  Skin: Negative for rash.  Neurological: Negative for dizziness, sensory change, loss of consciousness, weakness and headaches.  Endo/Heme/Allergies: Negative for environmental allergies. Does not bruise/bleed easily.  Psychiatric/Behavioral: Negative for depression and suicidal ideas. The patient is not nervous/anxious and does not have insomnia.        Objective:    Physical Exam  Constitutional: She is oriented to person, place, and time. She appears well-developed and well-nourished.  HENT:  Head: Normocephalic and atraumatic.  Eyes: Conjunctivae and EOM are normal.  Neck: Normal range of  motion. Neck supple. No JVD present. Carotid bruit is not present. No thyromegaly present.  Cardiovascular: Normal rate, regular rhythm and normal heart sounds.  No murmur heard. Pulmonary/Chest: Effort normal and breath sounds normal. No respiratory distress. She has no wheezes. She has no rales. She exhibits no tenderness.  Musculoskeletal: She exhibits no edema.  Neurological: She is alert and oriented to person, place, and time.  Psychiatric: She has a normal mood and affect.  Nursing note and vitals reviewed.   BP 118/80   Pulse 80   Temp 98.2 F (36.8 C) (Oral)   Ht 5\' 2"  (1.575 m)   Wt 118 lb (53.5 kg)   SpO2 98%   BMI 21.58 kg/m  Wt Readings from Last 3 Encounters:  06/18/17 118 lb (53.5 kg)  03/10/17 119 lb (54 kg)  09/23/16 125 lb 9.6 oz (57 kg)   BP Readings from Last 3 Encounters:  06/18/17 118/80  03/10/17 114/66  09/23/16 100/70     Immunization History  Administered Date(s) Administered  . Influenza Whole 05/05/2008  . Tdap 02/08/2013    Health Maintenance  Topic Date Due  . HIV Screening  03/20/1985  . MAMMOGRAM  09/15/2015  . INFLUENZA VACCINE  03/16/2018 (Originally 01/14/2017)  . PAP SMEAR  09/14/2017  . TETANUS/TDAP  02/09/2023    Lab Results  Component Value Date   WBC 8.2 02/08/2013   HGB 12.6 02/08/2013   HCT 37.0 02/08/2013   PLT 243.0 02/08/2013   GLUCOSE 85 02/08/2013   CHOL 198 02/08/2013   TRIG 100.0 02/08/2013   HDL 60.00 02/08/2013   LDLCALC 118 (H) 02/08/2013   ALT 23 02/08/2013   AST 17 02/08/2013   NA 136 02/08/2013   K 3.6 02/08/2013   CL 104 02/08/2013   CREATININE 0.7 02/08/2013   BUN 9 02/08/2013   CO2 29 02/08/2013   TSH 0.77 02/08/2013    Lab Results  Component Value Date   TSH 0.77 02/08/2013   Lab Results  Component Value Date   WBC 8.2 02/08/2013   HGB 12.6 02/08/2013   HCT 37.0 02/08/2013   MCV 88.9 02/08/2013   PLT 243.0 02/08/2013   Lab Results  Component Value Date   NA 136 02/08/2013   K 3.6  02/08/2013   CO2 29 02/08/2013   GLUCOSE 85 02/08/2013   BUN 9 02/08/2013   CREATININE 0.7 02/08/2013   BILITOT 0.4 02/08/2013   ALKPHOS 43 02/08/2013   AST 17 02/08/2013   ALT 23 02/08/2013   PROT 7.2 02/08/2013   ALBUMIN 4.2 02/08/2013   CALCIUM 8.9 02/08/2013   GFR 95.54 02/08/2013   Lab Results  Component Value Date   CHOL 198 02/08/2013   Lab Results  Component Value Date   HDL 60.00 02/08/2013   Lab Results  Component Value Date   LDLCALC 118 (H) 02/08/2013  Lab Results  Component Value Date   TRIG 100.0 02/08/2013   Lab Results  Component Value Date   CHOLHDL 3 02/08/2013   No results found for: HGBA1C       Assessment & Plan:   Problem List Items Addressed This Visit      Unprioritized   Chronic back pain   Relevant Medications   HYDROcodone-acetaminophen (NORCO) 10-325 MG tablet   HYDROcodone-acetaminophen (NORCO) 10-325 MG tablet   HYDROcodone-acetaminophen (NORCO) 10-325 MG tablet    Other Visit Diagnoses    High risk medication use    -  Primary   Relevant Orders   Pain Mgmt, Profile 8 w/Conf, U   Insomnia, unspecified type       Relevant Medications   traZODone (DESYREL) 50 MG tablet    problems are all stable--- no concerns  I am having Maryln GottronNicole Harts maintain her levonorgestrel, FLUoxetine, traZODone, HYDROcodone-acetaminophen, HYDROcodone-acetaminophen, and HYDROcodone-acetaminophen.  Meds ordered this encounter  Medications  . traZODone (DESYREL) 50 MG tablet    Sig: TAKE 1/2 TO 1 TABLET BY MOUTH EVERY NIGHT AT BEDTIME FOR SLEEP    Dispense:  30 tablet    Refill:  2  . HYDROcodone-acetaminophen (NORCO) 10-325 MG tablet    Sig: Take 1 tablet by mouth every 8 (eight) hours as needed.    Dispense:  60 tablet    Refill:  0  . HYDROcodone-acetaminophen (NORCO) 10-325 MG tablet    Sig: Take 1 tablet by mouth every 8 (eight) hours as needed.    Dispense:  60 tablet    Refill:  0    Do not fill until feb 2019  .  HYDROcodone-acetaminophen (NORCO) 10-325 MG tablet    Sig: Take 1 tablet by mouth every 6 (six) hours as needed.    Dispense:  60 tablet    Refill:  0    Do not fill until March 2019    CMA served as Neurosurgeonscribe during this visit. History, Physical and Plan performed by medical provider. Documentation and orders reviewed and attested to.  Donato SchultzYvonne R Lowne Chase, DO

## 2017-06-18 NOTE — Patient Instructions (Signed)

## 2017-06-23 LAB — PAIN MGMT, PROFILE 8 W/CONF, U
6 ACETYLMORPHINE: NEGATIVE ng/mL (ref <10)
AMINOCLONAZEPAM: NEGATIVE ng/mL (ref <25)
Alcohol Metabolites: NEGATIVE ng/mL (ref <500)
Alphahydroxyalprazolam: 118 ng/mL — ABNORMAL HIGH (ref <25)
Alphahydroxymidazolam: NEGATIVE ng/mL (ref <50)
Alphahydroxytriazolam: NEGATIVE ng/mL (ref <50)
Amphetamines: NEGATIVE ng/mL (ref <500)
BENZODIAZEPINES: POSITIVE ng/mL — AB (ref <100)
Buprenorphine, Urine: NEGATIVE ng/mL (ref <5)
Cocaine Metabolite: NEGATIVE ng/mL (ref <150)
Creatinine: 73.9 mg/dL
HYDROXYETHYLFLURAZEPAM: NEGATIVE ng/mL (ref <50)
Lorazepam: NEGATIVE ng/mL (ref <50)
MARIJUANA METABOLITE: NEGATIVE ng/mL (ref <20)
MDMA: NEGATIVE ng/mL (ref <500)
Nordiazepam: NEGATIVE ng/mL (ref <50)
OXAZEPAM: NEGATIVE ng/mL (ref <50)
OXYCODONE: NEGATIVE ng/mL (ref <100)
Opiates: NEGATIVE ng/mL (ref <100)
Oxidant: NEGATIVE ug/mL (ref <200)
Temazepam: NEGATIVE ng/mL (ref <50)
pH: 6.39 (ref 4.5–9.0)

## 2017-09-17 ENCOUNTER — Ambulatory Visit (INDEPENDENT_AMBULATORY_CARE_PROVIDER_SITE_OTHER): Payer: Self-pay | Admitting: Family Medicine

## 2017-09-17 ENCOUNTER — Encounter: Payer: Self-pay | Admitting: Family Medicine

## 2017-09-17 DIAGNOSIS — G8929 Other chronic pain: Secondary | ICD-10-CM

## 2017-09-17 DIAGNOSIS — G4700 Insomnia, unspecified: Secondary | ICD-10-CM

## 2017-09-17 DIAGNOSIS — M545 Low back pain: Secondary | ICD-10-CM

## 2017-09-17 MED ORDER — HYDROCODONE-ACETAMINOPHEN 10-325 MG PO TABS: 1.0000 | ORAL_TABLET | Freq: Three times a day (TID) | ORAL | 0 refills | Status: DC | PRN

## 2017-09-17 MED ORDER — TRAZODONE HCL 50 MG PO TABS: ORAL_TABLET | ORAL | 2 refills | Status: DC

## 2017-09-17 MED ORDER — HYDROCODONE-ACETAMINOPHEN 10-325 MG PO TABS: 1.0000 | ORAL_TABLET | Freq: Four times a day (QID) | ORAL | 0 refills | Status: DC | PRN

## 2017-09-17 NOTE — Assessment & Plan Note (Signed)
Pain meds refilled Contract signed uds utd  rto 3 months Database reviewed

## 2017-09-17 NOTE — Patient Instructions (Signed)
Opioid Pain Medicine Information Opioids are powerful medicines that are used to treat moderate to severe pain. Opioids should be taken with the supervision of a trained health care provider. They should be taken for the shortest period of time as possible. This is because opioids can be addictive and the longer you take opioids, the greater your risk of addiction (opioid use disorder). What do opioids do? Opioids help to reduce or eliminate pain. When used for short periods of time, they can help you:  Sleep better.  Do better in physical or occupational therapy.  Feel better in the first few days after an injury.  Recover from surgery.  What is a pain treatment plan? A pain treatment plan is an agreement between you and your health care provider. Pain is unique to each person, and treatments vary depending on your condition. To manage your pain successfully, you and your health care provider need to understand each other and work together. To help you do this:  Discuss the goals of your treatment, including how much pain you might expect to have and how you will manage the pain.  Review the risks and benefits of taking opioid medicines for your condition.  Remember that a good treatment plan uses more than one approach and minimizes the chance of side effects.  Be honest about the amount of medicines you take, and about any drug or alcohol use.  Get pain medicine prescriptions from only one care provider.  Keep all follow-up visits as told by your health care provider. This is important.  What instructions should I follow while taking opioid pain medicine? While you are taking the medicine and for 8 hours after you stop taking the medicine, follow these instructions:  Do not drive.  Do not use machinery or power tools.  Do not sign legal documents.  Do not drink alcohol.  Do not take sleeping pills.  Do not supervise children by yourself.  Do not participate in activities  that require climbing or being in high places.  Do not enter a body of water-such as a lake, river, ocean, spa, or swimming pool-unless an adult is nearby who can monitor and help you.  What kinds of side effects can opioids cause? Opioids can cause side effects, such as:  Constipation.  Nausea.  Vomiting.  Drowsiness.  Confusion.  Opioid use disorder.  Breathing difficulties (respiratory depression).  Using opioid pain medicines for longer than 3 days increases your risk of these side effects. Taking opioid pain medicine for a long period of time can affect your ability to do daily tasks. It also puts you at risk for:  Motor vehicle accidents.  Depression.  Suicide.  Heart attack.  Overdose, which can sometimes lead to death.  What are alternative ways to manage pain? Pain can be managed with many types of alternative treatments. Ask your health care provider to refer you to one or more specialists who can help you manage pain through:  Physical or occupational therapy.  Counseling (cognitive behavioral therapy).  Good nutrition.  Biofeedback.  Massage.  Meditation.  Non-opioid medicine.  Following a gentle exercise program.  How can I keep others safe while I am taking opioid pain medicine?  Keep pain medicine in a locked cabinet, or in a secure area where children cannot reach it.  Never share your pain medicine with anyone.  Do not save any leftover pills. If you have leftover medicine, you can: 1. Bring the medicine to a prescription take-back program.   This is usually offered by the county or law enforcement. 2. Throw it out in the trash. To do this:  Mix the medicine with undesirable trash such as pet waste or food.  Put the mixture in a sealed container or plastic bag.  Throw it in the trash.  Destroy any personal information on the prescription bottle. How do I stop taking opioids if I have been taking them for a long time? If you have  been taking opioid medicine for more than a few weeks, you may need to slowly decrease (taper) how much you take until you stop completely. Tapering your use of opioids can decrease your chances of experiencing withdrawal symptoms, such as:  Pain and cramping in the abdomen.  Nausea.  Sweating.  Sleepiness.  Restlessness.  Uncontrollable shaking (tremors).  Cravings for the medicine.  Do not attempt to taper your use of opioids on your own. Talk with your health care provider about how to do this. Your health care provider may prescribe a step-down schedule based on how much medicine you are taking and how long you have been taking it. Where to find support: If you have been taking opioids for a long time, you may benefit from receiving support for quitting from a local support group or counselor. Ask your health care provider for a referral to these resources in your area. Where to find more information:  Centers for Disease Control and Prevention (CDC): www.cdc.gov/drugoverdose/opioids/index.html Get help right away if: Seek medical care right away if you are taking opioids and you (or people close to you) notice any of the following:  Difficulty breathing.  Breathing that is slower or more shallow than normal.  A very slow heartbeat (pulse).  Severe confusion.  Unconsciousness.  Sleepiness.  Slurred speech.  Nausea and vomiting.  Cold, clammy skin.  Blue lips or fingernails.  Limpness.  Abnormally small pupils.  If you think that you or someone else may have taken too much of an opioid medicine, get medical help right away. Do not wait to see if the symptoms go away on their own.  If you ever feel like you may hurt yourself or others, or have thoughts about taking your own life, get help right away. You can go to your nearest emergency department or call:  Your local emergency services (911 in the [place]  The hotline of the National Poison Control Center  (1-800-222-1222 in the [place]  A suicide crisis helpline, such as the National Suicide Prevention Lifeline at 1-800-273-8255. This is open 24 hours a day.  Summary  Opioid medicines can help you manage moderate to severe pain for a short period of time.  Discuss the goals of your treatment with your health care provider, including how much pain you might expect to have and how you will manage the pain.  A good treatment plan uses more than one approach. Pain can be managed with many types of alternative treatments.  If you think that you or someone else may have taken too much of an opioid, get medical help right away. This information is not intended to replace advice given to you by your health care provider. Make sure you discuss any questions you have with your health care provider. Document Released: 06/29/2015 Document Revised: 09/19/2016 Document Reviewed: 01/12/2015 Elsevier Interactive Patient Education  2018 [employer]  

## 2017-09-17 NOTE — Progress Notes (Signed)
Patient ID: Amy Wiley, female    DOB: [birth year]  Age: 47 y.o. MRN: [id number]    Subjective:  Subjective  HPI Amy Wiley presents for f/u pain meds -- she is doing well with   Review of Systems  Constitutional: Negative for activity change, appetite change, fatigue and unexpected weight change.  Respiratory: Negative for cough and shortness of breath.   Cardiovascular: Negative for chest pain and palpitations.  Musculoskeletal: Positive for back pain.  Psychiatric/Behavioral: Negative for behavioral problems and dysphoric mood. The patient is not nervous/anxious.     History Past Medical History:  Diagnosis Date  . Abnormal Pap smear   . ADD (attention deficit disorder)   . Anxiety     She has a past surgical history that includes Wisdom tooth extraction.   Her family history includes ALS in her paternal grandmother; Heart attack in her paternal grandmother and paternal uncle; Heart disease in her paternal grandfather and paternal grandmother; Heart disease (age of onset: 27) in her paternal uncle; Hypertension in her father.She reports that she has been smoking cigarettes.  She has never used smokeless tobacco. She reports that she does not drink alcohol or use drugs.  Current Outpatient Medications on File Prior to Visit  Medication Sig Dispense Refill  . FLUoxetine (PROZAC) 10 MG capsule TAKE 1 CAPSULE(10 MG) BY MOUTH DAILY 30 capsule 0  . levonorgestrel (MIRENA) 20 MCG/24HR IUD 1 each by Intrauterine route once.       No current facility-administered medications on file prior to visit.      Objective:  Objective  Physical Exam  Constitutional: She is oriented to person, place, and time. She appears well-developed and well-nourished.  HENT:  Head: Normocephalic and atraumatic.  Eyes: Conjunctivae and EOM are normal.  Neck: Normal range of motion. Neck supple. No JVD present. Carotid bruit is not present. No thyromegaly present.  Cardiovascular: Normal rate,  regular rhythm and normal heart sounds.  No murmur heard. Pulmonary/Chest: Effort normal and breath sounds normal. No respiratory distress. She has no wheezes. She has no rales. She exhibits no tenderness.  Musculoskeletal: She exhibits no edema or tenderness.  Neurological: She is alert and oriented to person, place, and time.  Psychiatric: She has a normal mood and affect.  Nursing note and vitals reviewed.  BP 112/70 (BP Location: Left Arm, Patient Position: Sitting, Cuff Size: Normal)   Pulse 90   Resp 16   Ht 5\' 2"  (1.575 m)   Wt 118 lb 3.2 oz (53.6 kg)   SpO2 98%   BMI 21.62 kg/m  Wt Readings from Last 3 Encounters:  09/17/17 118 lb 3.2 oz (53.6 kg)  06/18/17 118 lb (53.5 kg)  03/10/17 119 lb (54 kg)     Lab Results  Component Value Date   WBC 8.2 02/08/2013   HGB 12.6 02/08/2013   HCT 37.0 02/08/2013   PLT 243.0 02/08/2013   GLUCOSE 85 02/08/2013   CHOL 198 02/08/2013   TRIG 100.0 02/08/2013   HDL 60.00 02/08/2013   LDLCALC 118 (H) 02/08/2013   ALT 23 02/08/2013   AST 17 02/08/2013   NA 136 02/08/2013   K 3.6 02/08/2013   CL 104 02/08/2013   CREATININE 0.7 02/08/2013   BUN 9 02/08/2013   CO2 29 02/08/2013   TSH 0.77 02/08/2013    Dg Foot Complete Right  Result Date: 08/02/2015 CLINICAL DATA:  Right foot plantar pain no known injury EXAM: RIGHT FOOT COMPLETE - 3+ VIEW COMPARISON:  None. FINDINGS: Three  views of the right foot submitted. No acute fracture or subluxation. There is small plantar spur of calcaneus. IMPRESSION: No acute fracture or subluxation.  Small plantar spur of calcaneus. Electronically Signed   By: Natasha MeadLiviu  Pop M.D.   On: 08/02/2015 14:59     Assessment & Plan:  Plan  I am having Amy Wiley maintain her levonorgestrel, FLUoxetine, HYDROcodone-acetaminophen, traZODone, HYDROcodone-acetaminophen, and HYDROcodone-acetaminophen.  Meds ordered this encounter  Medications  . HYDROcodone-acetaminophen (NORCO) 10-325 MG tablet    Sig: Take 1  tablet by mouth every 8 (eight) hours as needed.    Dispense:  60 tablet    Refill:  0    Fill on or after 11/17/17  . traZODone (DESYREL) 50 MG tablet    Sig: TAKE 1/2 TO 1 TABLET BY MOUTH EVERY NIGHT AT BEDTIME FOR SLEEP    Dispense:  30 tablet    Refill:  2  . HYDROcodone-acetaminophen (NORCO) 10-325 MG tablet    Sig: Take 1 tablet by mouth every 6 (six) hours as needed.    Dispense:  60 tablet    Refill:  0    Fill on or after 10/17/17  . HYDROcodone-acetaminophen (NORCO) 10-325 MG tablet    Sig: Take 1 tablet by mouth every 8 (eight) hours as needed.    Dispense:  60 tablet    Refill:  0    Problem List Items Addressed This Visit      Unprioritized   Chronic back pain    Pain meds refilled Contract signed uds utd  rto 3 months Database reviewed       Relevant Medications   HYDROcodone-acetaminophen (NORCO) 10-325 MG tablet   HYDROcodone-acetaminophen (NORCO) 10-325 MG tablet   HYDROcodone-acetaminophen (NORCO) 10-325 MG tablet    Other Visit Diagnoses    Insomnia, unspecified type       Relevant Medications   traZODone (DESYREL) 50 MG tablet      Follow-up: Return in about 3 months (around 12/17/2017), or if symptoms worsen or fail to improve.  Donato SchultzYvonne R Lowne Chase, DO

## 2017-12-25 ENCOUNTER — Telehealth: Payer: Self-pay | Admitting: Family Medicine

## 2017-12-25 NOTE — Telephone Encounter (Signed)
LOV 09/17/17 with Dr. Zola ButtonLowne-Chase / Refill request for Norco and Desyrel / Last filled:  Disp Refills Start End   HYDROcodone-acetaminophen (NORCO) 10-325 MG tablet 60 tablet 0 09/17/2017    Sig - Route: Take 1 tablet by mouth every 8 (eight) hours as needed. - Oral   Sent to pharmacy as: HYDROcodone-acetaminophen [facility](NORCO) 10-325 MG tablet   Earliest Fill Date: 09/17/2017   Notes to Pharmacy: Fill on or after 11/17/17     Disp Refills Start End   traZODone (DESYREL) 50 MG tablet 30 tablet 2 09/17/2017    Sig: TAKE 1/2 TO 1 TABLET BY MOUTH EVERY NIGHT AT BEDTIME FOR SLEEP   Sent to pharmacy as: traZODone (DESYREL) 50 MG tablet

## 2017-12-25 NOTE — Telephone Encounter (Signed)
Copied from CRM [id number]. Topic: Quick Communication - Rx Refill/Question >> Dec 25, 2017  9:59 AM Oneal GroutSebastian, Jennifer S wrote: Medication: HYDROcodone-acetaminophen (NORCO) 10-325 MG tablet and traZODone (DESYREL) 50 MG tablet   Has the patient contacted their pharmacy? Yes.   (Agent: If no, request that the patient contact the pharmacy for the refill.) (Agent: If yes, when and what did the pharmacy advise?)  Preferred Pharmacy (with phone number or street name): [facility] on [address]  Agent: Please be advised that RX refills may take up to 3 business days. We ask that you follow-up with your pharmacy.

## 2017-12-28 ENCOUNTER — Other Ambulatory Visit: Payer: Self-pay | Admitting: *Deleted

## 2017-12-28 ENCOUNTER — Other Ambulatory Visit: Payer: Self-pay | Admitting: Family Medicine

## 2017-12-28 DIAGNOSIS — G4700 Insomnia, unspecified: Secondary | ICD-10-CM

## 2017-12-28 DIAGNOSIS — M545 Low back pain: Secondary | ICD-10-CM

## 2017-12-28 DIAGNOSIS — G8929 Other chronic pain: Secondary | ICD-10-CM

## 2017-12-28 MED ORDER — HYDROCODONE-ACETAMINOPHEN 10-325 MG PO TABS: 1.0000 | ORAL_TABLET | Freq: Three times a day (TID) | ORAL | 0 refills | Status: DC | PRN

## 2017-12-28 MED ORDER — TRAZODONE HCL 50 MG PO TABS: ORAL_TABLET | ORAL | 1 refills | Status: DC

## 2017-12-28 NOTE — Telephone Encounter (Signed)
Database ran and is on your desk for review.  Last filled per database: 11/14/17 Last written: 09/17/17 Last ov: 09/17/17 Next ov: 12/31/17 Contract: can get 12/31/17 UDS: can get 12/31/17

## 2017-12-28 NOTE — Telephone Encounter (Signed)
done

## 2017-12-29 NOTE — Telephone Encounter (Signed)
Left message on machine that rx has been sent in. 

## 2017-12-31 ENCOUNTER — Ambulatory Visit (INDEPENDENT_AMBULATORY_CARE_PROVIDER_SITE_OTHER): Payer: Self-pay | Admitting: Family Medicine

## 2017-12-31 ENCOUNTER — Encounter: Payer: Self-pay | Admitting: Family Medicine

## 2017-12-31 VITALS — BP 100/66 | HR 73 | Temp 98.9°F | Resp 16 | Ht 62.0 in | Wt 122.0 lb

## 2017-12-31 DIAGNOSIS — G8929 Other chronic pain: Secondary | ICD-10-CM

## 2017-12-31 DIAGNOSIS — M545 Low back pain: Secondary | ICD-10-CM

## 2017-12-31 DIAGNOSIS — Z79899 Other long term (current) drug therapy: Secondary | ICD-10-CM

## 2017-12-31 NOTE — Assessment & Plan Note (Signed)
Stable con't meds uds and contract  rto 3 months

## 2017-12-31 NOTE — Progress Notes (Signed)
Patient ID: Amy Wiley, female   DOB: 10/22/1970, 47 y.o.   MRN: [id number]    Subjective:  I acted as a Neurosurgeonscribe for Dr. Zola ButtonLowne-Chase.  Apolonio SchneidersSheketia, CMA   Patient ID: Amy Gottronicole Lipkin, female    DOB: 01/07/1971, 47 y.o.   MRN: [id number]  Chief Complaint  Patient presents with  . chronic back pain    HPI  Patient is in today for follow up chronic pain.  She is doing well on current dose.  Patient Care Team: Zola ButtonLowne Chase, Grayling CongressYvonne R, DO as PCP - General   Past Medical History:  Diagnosis Date  . Abnormal Pap smear   . ADD (attention deficit disorder)   . Anxiety     Past Surgical History:  Procedure Laterality Date  . WISDOM TOOTH EXTRACTION      Family History  Problem Relation Age of Onset  . Heart attack Paternal Grandmother   . Heart disease Paternal Grandmother   . ALS Paternal Grandmother   . Heart attack Paternal Uncle   . Heart disease Paternal Uncle [id number]       MI  . Hypertension Father   . Heart disease Paternal Grandfather     Social History   Socioeconomic History  . Marital status: Married    Spouse name: Not on file  . Number of children: Not on file  . Years of education: Not on file  . Highest education level: Not on file  Occupational History  . Occupation: [employer]    Comment: cpa  Social Needs  . Financial resource strain: Not on file  . Food insecurity:    Worry: Not on file    Inability: Not on file  . Transportation needs:    Medical: Not on file    Non-medical: Not on file  Tobacco Use  . Smoking status: Current Every Day Smoker    Types: Cigarettes  . Smokeless tobacco: Never Used  . Tobacco comment: 1-2 cig a day  Substance and Sexual Activity  . Alcohol use: No  . Drug use: No  . Sexual activity: Yes    Partners: Male    Birth control/protection: IUD    Comment: Mirena   Lifestyle  . Physical activity:    Days per week: Not on file    Minutes per session: Not on file  . Stress: Not on file  Relationships  .  Social connections:    Talks on phone: Not on file    Gets together: Not on file    Attends religious service: Not on file    Active member of club or organization: Not on file    Attends meetings of clubs or organizations: Not on file    Relationship status: Not on file  . Intimate partner violence:    Fear of current or ex partner: Not on file    Emotionally abused: Not on file    Physically abused: Not on file    Forced sexual activity: Not on file  Other Topics Concern  . Not on file  Social History Narrative   Exercise-- walks dog qd -- 2 miles    Outpatient Medications Prior to Visit  Medication Sig Dispense Refill  . HYDROcodone-acetaminophen (NORCO) 10-325 MG tablet Take 1 tablet by mouth every 6 (six) hours as needed. 60 tablet 0  . HYDROcodone-acetaminophen (NORCO) 10-325 MG tablet Take 1 tablet by mouth every 8 (eight) hours as needed. 60 tablet 0  . HYDROcodone-acetaminophen (NORCO) 10-325 MG tablet Take 1 tablet  by mouth every 8 (eight) hours as needed. 60 tablet 0  . levonorgestrel (MIRENA) 20 MCG/24HR IUD 1 each by Intrauterine route once.      . traZODone (DESYREL) 50 MG tablet TAKE 1/2 TO 1 TABLET BY MOUTH EVERY NIGHT AT BEDTIME FOR SLEEP 90 tablet 1  . FLUoxetine (PROZAC) 10 MG capsule TAKE 1 CAPSULE(10 MG) BY MOUTH DAILY 30 capsule 0   No facility-administered medications prior to visit.     Allergies  Allergen Reactions  . Varenicline Tartrate     REACTION: NV    Review of Systems  Constitutional: Negative for chills, fever and malaise/fatigue.  HENT: Negative for congestion and hearing loss.   Eyes: Negative for blurred vision and discharge.  Respiratory: Negative for cough, sputum production and shortness of breath.   Cardiovascular: Negative for chest pain, palpitations and leg swelling.  Gastrointestinal: Negative for abdominal pain, blood in stool, constipation, diarrhea, heartburn, nausea and vomiting.  Genitourinary: Negative for dysuria,  frequency, hematuria and urgency.  Musculoskeletal: Negative for back pain, falls and myalgias.  Skin: Negative for rash.  Neurological: Negative for dizziness, sensory change, loss of consciousness, weakness and headaches.  Endo/Heme/Allergies: Negative for environmental allergies. Does not bruise/bleed easily.  Psychiatric/Behavioral: Negative for depression and suicidal ideas. The patient is not nervous/anxious and does not have insomnia.        Objective:    Physical Exam  Constitutional: She is oriented to person, place, and time. She appears well-developed and well-nourished.  HENT:  Head: Normocephalic and atraumatic.  Eyes: Conjunctivae and EOM are normal.  Neck: Normal range of motion. Neck supple. No JVD present. Carotid bruit is not present. No thyromegaly present.  Cardiovascular: Normal rate, regular rhythm and normal heart sounds.  No murmur heard. Pulmonary/Chest: Effort normal and breath sounds normal. No respiratory distress. She has no wheezes. She has no rales. She exhibits no tenderness.  Musculoskeletal: She exhibits no edema.  Neurological: She is alert and oriented to person, place, and time.  Psychiatric: She has a normal mood and affect.  Nursing note and vitals reviewed.   BP 100/66 (BP Location: Right Arm, Cuff Size: Normal)   Pulse 73   Temp 98.9 F (37.2 C) (Oral)   Resp 16   Ht 5\' 2"  (1.575 m)   Wt 122 lb (55.3 kg)   SpO2 98%   BMI 22.31 kg/m  Wt Readings from Last 3 Encounters:  12/31/17 122 lb (55.3 kg)  09/17/17 118 lb 3.2 oz (53.6 kg)  06/18/17 118 lb (53.5 kg)   BP Readings from Last 3 Encounters:  12/31/17 100/66  09/17/17 112/70  06/18/17 118/80     Immunization History  Administered Date(s) Administered  . Influenza Whole 05/05/2008  . Tdap 02/08/2013    Health Maintenance  Topic Date Due  . HIV Screening  03/20/1985  . MAMMOGRAM  09/15/2015  . PAP SMEAR  09/14/2017  . INFLUENZA VACCINE  03/16/2018 (Originally 01/14/2018)    . TETANUS/TDAP  02/09/2023    Lab Results  Component Value Date   WBC 8.2 02/08/2013   HGB 12.6 02/08/2013   HCT 37.0 02/08/2013   PLT 243.0 02/08/2013   GLUCOSE 85 02/08/2013   CHOL 198 02/08/2013   TRIG 100.0 02/08/2013   HDL 60.00 02/08/2013   LDLCALC 118 (H) 02/08/2013   ALT 23 02/08/2013   AST 17 02/08/2013   NA 136 02/08/2013   K 3.6 02/08/2013   CL 104 02/08/2013   CREATININE 0.7 02/08/2013   BUN 9  02/08/2013   CO2 29 02/08/2013   TSH 0.77 02/08/2013    Lab Results  Component Value Date   TSH 0.77 02/08/2013   Lab Results  Component Value Date   WBC 8.2 02/08/2013   HGB 12.6 02/08/2013   HCT 37.0 02/08/2013   MCV 88.9 02/08/2013   PLT 243.0 02/08/2013   Lab Results  Component Value Date   NA 136 02/08/2013   K 3.6 02/08/2013   CO2 29 02/08/2013   GLUCOSE 85 02/08/2013   BUN 9 02/08/2013   CREATININE 0.7 02/08/2013   BILITOT 0.4 02/08/2013   ALKPHOS 43 02/08/2013   AST 17 02/08/2013   ALT 23 02/08/2013   PROT 7.2 02/08/2013   ALBUMIN 4.2 02/08/2013   CALCIUM 8.9 02/08/2013   GFR 95.54 02/08/2013   Lab Results  Component Value Date   CHOL 198 02/08/2013   Lab Results  Component Value Date   HDL 60.00 02/08/2013   Lab Results  Component Value Date   LDLCALC 118 (H) 02/08/2013   Lab Results  Component Value Date   TRIG 100.0 02/08/2013   Lab Results  Component Value Date   CHOLHDL 3 02/08/2013   No results found for: HGBA1C       Assessment & Plan:   Problem List Items Addressed This Visit      Unprioritized   Chronic back pain - Primary    Stable con't meds uds and contract  rto 3 months      Relevant Orders   Pain Mgmt, Profile 8 w/Conf, U    Other Visit Diagnoses    High risk medication use       Relevant Orders   Pain Mgmt, Profile 8 w/Conf, U      I have discontinued Gwendolynn Nienhaus's FLUoxetine. I am also having her maintain her levonorgestrel, HYDROcodone-acetaminophen, HYDROcodone-acetaminophen, traZODone,  and HYDROcodone-acetaminophen.  No orders of the defined types were placed in this encounter.   CMA served as Neurosurgeon during this visit. History, Physical and Plan performed by medical provider. Documentation and orders reviewed and attested to.  Donato Schultz, DO

## 2017-12-31 NOTE — Patient Instructions (Signed)

## 2018-01-04 LAB — PAIN MGMT, PROFILE 8 W/CONF, U
6 Acetylmorphine: NEGATIVE ng/mL (ref <10)
ALCOHOL METABOLITES: NEGATIVE ng/mL (ref <500)
AMPHETAMINES: NEGATIVE ng/mL (ref <500)
Benzodiazepines: NEGATIVE ng/mL (ref <100)
Buprenorphine, Urine: NEGATIVE ng/mL (ref <5)
COCAINE METABOLITE: NEGATIVE ng/mL (ref <150)
CODEINE: NEGATIVE ng/mL (ref <50)
CREATININE: 165.2 mg/dL
Hydrocodone: 2109 ng/mL — ABNORMAL HIGH (ref <50)
Hydromorphone: 1283 ng/mL — ABNORMAL HIGH (ref <50)
MARIJUANA METABOLITE: NEGATIVE ng/mL (ref <20)
MDMA: NEGATIVE ng/mL (ref <500)
MORPHINE: NEGATIVE ng/mL (ref <50)
Norhydrocodone: 7390 ng/mL — ABNORMAL HIGH (ref <50)
OPIATES: POSITIVE ng/mL — AB (ref <100)
OXIDANT: NEGATIVE ug/mL (ref <200)
OXYCODONE: NEGATIVE ng/mL (ref <100)
pH: 6.58 (ref 4.5–9.0)

## 2018-01-18 ENCOUNTER — Encounter: Payer: Self-pay | Admitting: Family Medicine

## 2018-01-19 ENCOUNTER — Other Ambulatory Visit: Payer: Self-pay | Admitting: Family Medicine

## 2018-01-19 DIAGNOSIS — M545 Low back pain: Principal | ICD-10-CM

## 2018-01-19 DIAGNOSIS — G8929 Other chronic pain: Secondary | ICD-10-CM

## 2018-01-19 MED ORDER — HYDROCODONE-ACETAMINOPHEN 10-325 MG PO TABS: 1.0000 | ORAL_TABLET | Freq: Four times a day (QID) | ORAL | 0 refills | Status: DC | PRN

## 2018-01-19 MED ORDER — HYDROCODONE-ACETAMINOPHEN 10-325 MG PO TABS: 1.0000 | ORAL_TABLET | Freq: Three times a day (TID) | ORAL | 0 refills | Status: DC | PRN

## 2018-01-19 NOTE — Telephone Encounter (Signed)
Database ran on 12/28/17 and is in media for review  Last written: 12/28/17 Last ov: 12/31/17 Next ov: none Contract: 01/01/19 UDS: 04/02/18

## 2018-02-17 ENCOUNTER — Encounter: Payer: Self-pay | Admitting: Family Medicine

## 2018-02-17 DIAGNOSIS — G8929 Other chronic pain: Secondary | ICD-10-CM

## 2018-02-17 DIAGNOSIS — M545 Low back pain: Principal | ICD-10-CM

## 2018-04-16 ENCOUNTER — Other Ambulatory Visit: Payer: Self-pay | Admitting: Family Medicine

## 2018-04-16 DIAGNOSIS — M545 Low back pain, unspecified: Secondary | ICD-10-CM

## 2018-04-16 DIAGNOSIS — G8929 Other chronic pain: Secondary | ICD-10-CM

## 2018-04-16 NOTE — Telephone Encounter (Signed)
Rx refill request: hydrocodone-acetaminophen 10-325 mg   ( Rx protocol is requesting new diagnosis in order to request refill- sent for provider review)  LOV: 01/19/18  PCP: Zola Button  Pharmacy: verified  Patient is requesting refill- she states she will run out before refill review appointment.

## 2018-04-16 NOTE — Telephone Encounter (Unsigned)
Copied from CRM [id number]. Topic: Quick Communication - See Telephone Encounter >> Apr 16, 2018 10:05 AM Windy Kalata, NT wrote: CRM for notification. See Telephone encounter for: 04/16/18.  Patient is calling and refill on HYDROcodone-acetaminophen (NORCO) 10-325 MG tablet. She states she runs out tomorrow 04/17/18 and has a medication refill appointment on 04/20/18.  [id number] [place], [place] - [address] AT [facility] OF [address] & [address] [place] [address] Phone: [phone] Fax: [phone]

## 2018-04-20 ENCOUNTER — Ambulatory Visit (INDEPENDENT_AMBULATORY_CARE_PROVIDER_SITE_OTHER): Payer: Self-pay | Admitting: Family Medicine

## 2018-04-20 ENCOUNTER — Encounter: Payer: Self-pay | Admitting: Family Medicine

## 2018-04-20 VITALS — BP 100/68 | HR 74 | Temp 98.0°F | Resp 16 | Ht 62.0 in | Wt 117.8 lb

## 2018-04-20 DIAGNOSIS — M4126 Other idiopathic scoliosis, lumbar region: Secondary | ICD-10-CM

## 2018-04-20 DIAGNOSIS — M545 Low back pain, unspecified: Secondary | ICD-10-CM

## 2018-04-20 DIAGNOSIS — G8929 Other chronic pain: Secondary | ICD-10-CM

## 2018-04-20 DIAGNOSIS — M4125 Other idiopathic scoliosis, thoracolumbar region: Secondary | ICD-10-CM

## 2018-04-20 MED ORDER — HYDROCODONE-ACETAMINOPHEN 10-325 MG PO TABS: 1.0000 | ORAL_TABLET | Freq: Three times a day (TID) | ORAL | 0 refills | Status: DC | PRN

## 2018-04-20 MED ORDER — HYDROCODONE-ACETAMINOPHEN 10-325 MG PO TABS: ORAL_TABLET | ORAL | 0 refills | Status: DC

## 2018-04-20 MED ORDER — HYDROCODONE-ACETAMINOPHEN 10-325 MG PO TABS: 1.0000 | ORAL_TABLET | Freq: Four times a day (QID) | ORAL | 0 refills | Status: DC | PRN

## 2018-04-20 NOTE — Progress Notes (Signed)
Patient ID: Amy Wiley, female    DOB: January 08, 1971  Age: 47 y.o. MRN: [id number]    Subjective:  Subjective  HPI Amy Wiley presents for f/u chronic pain meds.  She takes 1- 1 1/2 tabs daily due to scoliosis   That controls the pain for her   No other complains   Review of Systems  Constitutional: Negative for chills and fever.  HENT: Negative for congestion and hearing loss.   Eyes: Negative for discharge.  Respiratory: Negative for cough and shortness of breath.   Cardiovascular: Negative for chest pain, palpitations and leg swelling.  Gastrointestinal: Negative for abdominal pain, blood in stool, constipation, diarrhea, nausea and vomiting.  Genitourinary: Negative for dysuria, frequency, hematuria and urgency.  Musculoskeletal: Positive for back pain. Negative for myalgias.  Skin: Negative for rash.  Allergic/Immunologic: Negative for environmental allergies.  Neurological: Negative for dizziness, weakness and headaches.  Hematological: Does not bruise/bleed easily.  Psychiatric/Behavioral: Negative for suicidal ideas. The patient is not nervous/anxious.     History Past Medical History:  Diagnosis Date  . Abnormal Pap smear   . ADD (attention deficit disorder)   . Anxiety     She has a past surgical history that includes Wisdom tooth extraction.   Her family history includes ALS in her paternal grandmother; Heart attack in her paternal grandmother and paternal uncle; Heart disease in her paternal grandfather and paternal grandmother; Heart disease (age of onset: 10) in her paternal uncle; Hypertension in her father.She reports that she has been smoking cigarettes. She has never used smokeless tobacco. She reports that she does not drink alcohol or use drugs.  Current Outpatient Medications on File Prior to Visit  Medication Sig Dispense Refill  . levonorgestrel (MIRENA) 20 MCG/24HR IUD 1 each by Intrauterine route once.      . traZODone (DESYREL) 50 MG tablet TAKE  1/2 TO 1 TABLET BY MOUTH EVERY NIGHT AT BEDTIME FOR SLEEP 90 tablet 1   No current facility-administered medications on file prior to visit.      Objective:  Objective  Physical Exam  Constitutional: She is oriented to person, place, and time. She appears well-developed and well-nourished.  HENT:  Head: Normocephalic and atraumatic.  Eyes: Conjunctivae and EOM are normal.  Neck: Normal range of motion. Neck supple. No JVD present. Carotid bruit is not present. No thyromegaly present.  Cardiovascular: Normal rate, regular rhythm and normal heart sounds.  No murmur heard. Pulmonary/Chest: Effort normal and breath sounds normal. No respiratory distress. She has no wheezes. She has no rales. She exhibits no tenderness.  Musculoskeletal: She exhibits no edema.  Neurological: She is alert and oriented to person, place, and time.  Psychiatric: She has a normal mood and affect.  Nursing note and vitals reviewed.  BP 100/68 (BP Location: Left Arm, Cuff Size: Normal)   Pulse 74   Temp 98 F (36.7 C) (Oral)   Resp 16   Ht 5\' 2"  (1.575 m)   Wt 117 lb 12.8 oz (53.4 kg)   SpO2 98%   BMI 21.55 kg/m  Wt Readings from Last 3 Encounters:  04/20/18 117 lb 12.8 oz (53.4 kg)  12/31/17 122 lb (55.3 kg)  09/17/17 118 lb 3.2 oz (53.6 kg)     Lab Results  Component Value Date   WBC 8.2 02/08/2013   HGB 12.6 02/08/2013   HCT 37.0 02/08/2013   PLT 243.0 02/08/2013   GLUCOSE 85 02/08/2013   CHOL 198 02/08/2013   TRIG 100.0 02/08/2013  HDL 60.00 02/08/2013   LDLCALC 118 (H) 02/08/2013   ALT 23 02/08/2013   AST 17 02/08/2013   NA 136 02/08/2013   K 3.6 02/08/2013   CL 104 02/08/2013   CREATININE 0.7 02/08/2013   BUN 9 02/08/2013   CO2 29 02/08/2013   TSH 0.77 02/08/2013    Dg Foot Complete Right  Result Date: 08/02/2015 CLINICAL DATA:  Right foot plantar pain no known injury EXAM: RIGHT FOOT COMPLETE - 3+ VIEW COMPARISON:  None. FINDINGS: Three views of the right foot submitted. No  acute fracture or subluxation. There is small plantar spur of calcaneus. IMPRESSION: No acute fracture or subluxation.  Small plantar spur of calcaneus. Electronically Signed   By: Natasha Mead M.D.   On: 08/02/2015 14:59     Assessment & Plan:  Plan  I have changed Joni Reining Sweatt's HYDROcodone-acetaminophen. I am also having her maintain her levonorgestrel, traZODone, HYDROcodone-acetaminophen, and HYDROcodone-acetaminophen.  Meds ordered this encounter  Medications  . DISCONTD: HYDROcodone-acetaminophen (NORCO) 10-325 MG tablet    Sig: Take 1 tablet by mouth every 6 (six) hours as needed.    Dispense:  60 tablet    Refill:  0    Fill on or after 06/27/2018  . DISCONTD: HYDROcodone-acetaminophen (NORCO) 10-325 MG tablet    Sig: Take 1 tablet by mouth every 8 (eight) hours as needed.    Dispense:  60 tablet    Refill:  0    Do not fill until 05/27/2018  . DISCONTD: HYDROcodone-acetaminophen (NORCO) 10-325 MG tablet    Sig: Take 1 tablet by mouth every 8 (eight) hours as needed.    Dispense:  60 tablet    Refill:  0    Do not fill until 04/27/2018  . HYDROcodone-acetaminophen (NORCO) 10-325 MG tablet    Sig: 1 po q8h prn    Dispense:  60 tablet    Refill:  0    Fill on or after 06/20/2018  . HYDROcodone-acetaminophen (NORCO) 10-325 MG tablet    Sig: Take 1 tablet by mouth every 8 (eight) hours as needed.    Dispense:  60 tablet    Refill:  0    Do not fill until 05/20/2018  . HYDROcodone-acetaminophen (NORCO) 10-325 MG tablet    Sig: Take 1 tablet by mouth every 8 (eight) hours as needed.    Dispense:  60 tablet    Refill:  0    Do not fill until 04/16/2018    Problem List Items Addressed This Visit      Unprioritized   Idiopathic scoliosis   Relevant Medications   HYDROcodone-acetaminophen (NORCO) 10-325 MG tablet   HYDROcodone-acetaminophen (NORCO) 10-325 MG tablet   HYDROcodone-acetaminophen (NORCO) 10-325 MG tablet    Other Visit Diagnoses    Chronic low back pain    -   Primary   Relevant Medications   HYDROcodone-acetaminophen (NORCO) 10-325 MG tablet   HYDROcodone-acetaminophen (NORCO) 10-325 MG tablet   HYDROcodone-acetaminophen (NORCO) 10-325 MG tablet    pain is stable  Pt takes 1 a day only on most days  uds utd   Follow-up: Return in about 3 months (around 07/21/2018), or if symptoms worsen or fail to improve, for for pain meds.  Donato Schultz, DO

## 2018-04-20 NOTE — Patient Instructions (Signed)
Scoliosis Scoliosis is the name given to a spine that curves sideways.Scoliosis can cause twisting of your shoulders, hips, chest, back, and rib cage. What are the causes? The cause of scoliosis is not always known. It may be caused by a birth defect or by a disease that can cause muscular dysfunction and imbalance, such as cerebral palsy and muscular dystrophy. What increases the risk? Having a disease that causes muscle disease or dysfunction. What are the signs or symptoms? Scoliosis often has no signs or symptoms.If they are present, they may include:  Unequal size of one body side compared to the other (asymmetry).  Visible curvature of the spine.  Pain. The pain may limit physical activity.  Shortness of breath.  Bowel or bladder issues.  How is this diagnosed? A skilled health care provider will perform an evaluation. This will involve:  Taking your history.  Performing a physical examination.  Performing a neurological exam to detect nerve or muscle function loss.  Range of motion studies on the spine.  X-rays.  An MRI may also be obtained. How is this treated? Treatment varies depending on the nature, extent, and severity of the disease. If the curvature is not great, you may need only observation. A brace may be used to prevent scoliosis from progressing. A brace may also be needed during growth spurts. Physical therapy may be of benefit. Surgery may be required. Follow these instructions at home:  Your health care provider may suggest exercises to strengthen your muscles. Perform them as directed.  Ask your health care provider before participating in any sports.  If you have been prescribed an orthopedic brace, wear it as instructed by your health care provider. Contact a health care provider if: Your brace causes the skin to become sore (chafe) or is uncomfortable. Get help right away if:  You have back pain that is not relieved by the medicines prescribed  by your health care provider.  Your legs feel weak or you lose function in your legs.  You lose some bowel or bladder control. This information is not intended to replace advice given to you by your health care provider. Make sure you discuss any questions you have with your health care provider. Document Released: 05/30/2000 Document Revised: 11/08/2015 Document Reviewed: 12/06/2015 Elsevier Interactive Patient Education  2018 Elsevier Inc.  

## 2018-04-20 NOTE — Telephone Encounter (Signed)
Pt was seen today.

## 2018-04-20 NOTE — Telephone Encounter (Signed)
Pt is requesting refill on hydrocodone.   Last OV: 12/31/2017 Last Fill: 01/19/2018 #60 and 0RF x3 UDS: 12/31/2017 Low risk

## 2018-06-17 ENCOUNTER — Other Ambulatory Visit: Payer: Self-pay | Admitting: Family Medicine

## 2018-06-17 DIAGNOSIS — G4700 Insomnia, unspecified: Secondary | ICD-10-CM

## 2018-07-19 ENCOUNTER — Telehealth: Payer: Self-pay | Admitting: Family Medicine

## 2018-07-19 DIAGNOSIS — M545 Low back pain, unspecified: Secondary | ICD-10-CM

## 2018-07-19 DIAGNOSIS — G8929 Other chronic pain: Secondary | ICD-10-CM

## 2018-07-19 DIAGNOSIS — M4125 Other idiopathic scoliosis, thoracolumbar region: Secondary | ICD-10-CM

## 2018-07-19 NOTE — Telephone Encounter (Signed)
Copied from CRM [id number]. Topic: Quick Communication - Rx Refill/Question >> Jul 19, 2018  5:45 PM Jilda Roche wrote: Medication:  HYDROcodone-acetaminophen (NORCO) 10-325 MG tablet   Has the patient contacted their pharmacy? No. (Agent: If no, request that the patient contact the pharmacy for the refill.) (Agent: If yes, when and what did the pharmacy advise?) [id number] - [place], [place] - [address] AT [facility] [address] [phone] (Phone) [phone] (Fax)   Preferred Pharmacy (with phone number or street name):   Agent: Please be advised that RX refills may take up to 3 business days. We ask that you follow-up with your pharmacy.

## 2018-07-20 MED ORDER — HYDROCODONE-ACETAMINOPHEN 10-325 MG PO TABS: 1.0000 | ORAL_TABLET | Freq: Three times a day (TID) | ORAL | 0 refills | Status: DC | PRN

## 2018-07-20 NOTE — Telephone Encounter (Signed)
Sent!

## 2018-07-20 NOTE — Telephone Encounter (Signed)
Pt is requesting refill on hydrocodone. Lowne Pt.   Last OV: 04/20/2018 Last Fill: 04/20/2018 #60 AND 0rf UDS: 12/31/2017 Low risk

## 2018-07-29 ENCOUNTER — Ambulatory Visit: Payer: Self-pay | Admitting: Family Medicine

## 2018-08-02 ENCOUNTER — Encounter: Payer: Self-pay | Admitting: Family Medicine

## 2018-08-02 ENCOUNTER — Ambulatory Visit (INDEPENDENT_AMBULATORY_CARE_PROVIDER_SITE_OTHER): Payer: BLUE CROSS/BLUE SHIELD | Admitting: Family Medicine

## 2018-08-02 DIAGNOSIS — M545 Low back pain, unspecified: Secondary | ICD-10-CM

## 2018-08-02 DIAGNOSIS — M4125 Other idiopathic scoliosis, thoracolumbar region: Secondary | ICD-10-CM

## 2018-08-02 DIAGNOSIS — G8929 Other chronic pain: Secondary | ICD-10-CM | POA: Insufficient documentation

## 2018-08-02 MED ORDER — HYDROCODONE-ACETAMINOPHEN 10-325 MG PO TABS: 1.0000 | ORAL_TABLET | Freq: Three times a day (TID) | ORAL | 0 refills | Status: DC | PRN

## 2018-08-02 NOTE — Assessment & Plan Note (Signed)
Causing back pain--   Stable con't meds

## 2018-08-02 NOTE — Progress Notes (Signed)
Patient ID: Amy Wiley, female    DOB: 08/06/1970  Age: 48 y.o. MRN: [id number]    Subjective:  Subjective  HPI Amy Wiley presents for f/u pain meds.  No complaints.  She is doing well . No new complaints.    Review of Systems  History Past Medical History:  Diagnosis Date  . Abnormal Pap smear   . ADD (attention deficit disorder)   . Anxiety     She has a past surgical history that includes Wisdom tooth extraction.   Her family history includes ALS in her paternal grandmother; Heart attack in her paternal grandmother and paternal uncle; Heart disease in her paternal grandfather and paternal grandmother; Heart disease (age of onset: 5965) in her paternal uncle; Hypertension in her father.She reports that she has been smoking cigarettes. She has never used smokeless tobacco. She reports that she does not drink alcohol or use drugs.  Current Outpatient Medications on File Prior to Visit  Medication Sig Dispense Refill  . HYDROcodone-acetaminophen (NORCO) 10-325 MG tablet Take 1 tablet by mouth every 8 (eight) hours as needed. 60 tablet 0  . levonorgestrel (MIRENA) 20 MCG/24HR IUD 1 each by Intrauterine route once.      . traZODone (DESYREL) 50 MG tablet TAKE 1/2 TO 1 TABLET BY MOUTH EVERY NIGHT AT BEDTIME FOR SLEEP 90 tablet 1   No current facility-administered medications on file prior to visit.      Objective:  Objective  Physical Exam BP 102/64   Pulse 77   Resp 12   Ht 5' (1.524 m)   Wt 115 lb (52.2 kg)   SpO2 98%   BMI 22.46 kg/m  Wt Readings from Last 3 Encounters:  08/02/18 115 lb (52.2 kg)  04/20/18 117 lb 12.8 oz (53.4 kg)  12/31/17 122 lb (55.3 kg)     Lab Results  Component Value Date   WBC 8.2 02/08/2013   HGB 12.6 02/08/2013   HCT 37.0 02/08/2013   PLT 243.0 02/08/2013   GLUCOSE 85 02/08/2013   CHOL 198 02/08/2013   TRIG 100.0 02/08/2013   HDL 60.00 02/08/2013   LDLCALC 118 (H) 02/08/2013   ALT 23 02/08/2013   AST 17 02/08/2013   NA 136  02/08/2013   K 3.6 02/08/2013   CL 104 02/08/2013   CREATININE 0.7 02/08/2013   BUN 9 02/08/2013   CO2 29 02/08/2013   TSH 0.77 02/08/2013    Dg Foot Complete Right  Result Date: 08/02/2015 CLINICAL DATA:  Right foot plantar pain no known injury EXAM: RIGHT FOOT COMPLETE - 3+ VIEW COMPARISON:  None. FINDINGS: Three views of the right foot submitted. No acute fracture or subluxation. There is small plantar spur of calcaneus. IMPRESSION: No acute fracture or subluxation.  Small plantar spur of calcaneus. Electronically Signed   By: Natasha MeadLiviu  Pop M.D.   On: 08/02/2015 14:59    Lorazepam Milligram Equivalents   Contact Appriss Support   Amy Wiley, [place]43F Powered by    Narx Report  Resources Date: 08/02/2018  Amy Wiley  Linked Records             Search Criteria         Risk Indicators    Narx Scores  Narcotic  361  Sedative  150  Stimulant  000  Explanation and Guidance   Overdose Risk Score  070  (Range 000-999)   Explanation and Guidance   Additional Risk Indicators (0)  Explanation and Guidance   This NarxCare report is based  on search criteria supplied and the data entered by the dispensing pharmacy. For more information about any prescription, please contact the dispensing pharmacy or the prescriber. NarxCare scores and reports are intended to aid, not replace, medical decision making. None of the information presented should be used as sole justification for providing or refusing to provide medications. The information on this report is not warranted as accurate or complete.  Graphs  Rx Graph   [x]   Narcotic  [x]   Buprenorphine  [x]   Sedative  [x]   Stimulant  [x]   Other   All PrescribersPrescribers2 - [place] Lowne Ch1 - Jose Ernesto Paz,Timeline02/180m6m1y2y Morphine MgEq (MME)  [id number] Timeline02/12m6m1y2y Buprenorphine mg  [id number] Timeline02/168m6m1y2y Lorazepam MgEq (LME)  [id number] Timeline02/126m6m1y2y *Per CDC guidance, the MME conversion  factors prescribed or provided as part of the medication-assisted treatment for opioid use disorder should not be used to benchmark against dosage thresholds meant for opioids prescribed for pain. Buprenorphine products have no agreed upon morphine equivalency, and as partial opioid agonists, are not expected to be associated with overdose risk in the same dose-dependent manner as doses for full agonist opioids. MME = morphine milligram equivalents. LME = Lorazepam milligram equivalents. MG = dose in milligrams.  Summary  Summary Total Prescriptions:    22   Total Prescribers:    2   Total Pharmacies:    1   Narcotics* (excluding Buprenorphine) Current Qty:   18  Current MME/day:   30.00  30 Day Avg MME/day:   20.00  Sedatives* Current Qty:   0  Current LME/day:   0.00  30 Day Avg LME/day:   0.00  Buprenorphine* Current Qty:   0  Current mg/day:   0.00  30 Day Avg mg/day:   0.00  Rx Data  *Highlighted drugs could not be included in score calculations  Prescriptions Total Prescriptions: 22   Total Private Pay: 30   Fill Date ID   Written Drug Qty Days Prescriber Rx # Pharmacy Refill   Daily Dose* Pymt Type PMP    07/20/2018  1   07/20/2018  Hydrocodone-Acetamin 10-325 MG  60.00 20 [place]   [id number]   Wal [phone])   0  30.00 MME  Comm Ins   [place]  06/20/2018  1   04/20/2018  Hydrocodone-Acetamin 10-325 MG  60.00 20 Yv Low   [id number]   Wal (4116)   0  30.00 MME  Comm Ins   [place]  05/20/2018  1   04/20/2018  Hydrocodone-Acetamin 10-325 MG  60.00 20 Yv Low   [id number]   Wal (4116)   0  30.00 MME  Comm Ins   [place]  04/20/2018  1   04/20/2018  Hydrocodone-Acetamin 10-325 MG  60.00 20 Yv Low   [id number]   Wal (4116)   0  30.00 MME  Comm Ins   [place]  03/16/2018  1   01/19/2018  Hydrocodone-Acetamin 10-325 MG  60.00 20 Yv Low   [id number]   Wal (4116)   0  30.00 MME  Comm Ins   [place]  02/17/2018  1   02/17/2018  Hydrocodone-Acetamin 10-325 MG  60.00 15 Yv Low   [id number]   Wal (4116)   0  40.00 MME  Private Pay   [place]    01/25/2018  1   01/19/2018  Hydrocodone-Acetamin 10-325 MG  60.00 20 Yv Low   [id number]   Wal (4116)   0  30.00 MME  Private Pay   [place]  12/28/2017  1  12/28/2017  Hydrocodone-Acetamin 10-325 MG  60.00 20 Yv Low   [id number]   Wal (4116)   0  30.00 MME  Private Pay   [place]  11/14/2017  1   09/17/2017  Hydrocodone-Acetamin 10-325 MG  60.00 20 Yv Low   [id number]   Wal (4116)   0  30.00 MME  Private Pay   [place]  10/17/2017  1   09/17/2017  Hydrocodone-Acetamin 10-325 MG  60.00 15 Yv Low   [id number]   Wal (4116)   0  40.00 MME  Private Pay   [place]  09/17/2017  1   09/17/2017  Hydrocodone-Acetamin 10-325 MG  60.00 20 Yv Low   [id number]   Wal (4116)   0  30.00 MME  Private Pay   [place]  08/17/2017  1   06/18/2017  Hydrocodone-Acetamin 10-325 MG  60.00 15 Yv Low   [id number]   Wal (4116)   0  40.00 MME  Private Pay   [place]  07/16/2017  1   06/18/2017  Hydrocodone-Acetamin 10-325 MG  60.00 20 Yv Low   [id number]   Wal (4116)   0  30.00 MME  Private Pay   [place]  06/18/2017  1   06/18/2017  Hydrocodone-Acetamin 10-325 MG  60.00 20 Yv Low   [id number]   Wal (4116)   0  30.00 MME  Private Pay   [place]  05/10/2017  1   03/10/2017  Hydrocodone-Acetamin 10-325 MG  30.00 10 Yv Low   [id number]   Wal (4116)   0  30.00 MME  Private Pay   [place]  04/09/2017  1   03/10/2017  Hydrocodone-Acetamin 10-325 MG  30.00 10 Yv Low   [id number]   Wal (4116)   0  30.00 MME  Private Pay   [place]  03/10/2017  1   03/10/2017  Hydrocodone-Acetamin 10-325 MG  60.00 15 Yv Low   [id number]   Wal (4116)   0  40.00 MME  Private Pay   [place]  02/09/2017  1   02/06/2017  Hydrocodone-Acetamin 10-325 MG  60.00 15 Yv Low   [id number]   Wal (4116)   0  40.00 MME  Private Pay   [place]  01/09/2017  1   01/09/2017  Hydrocodone-Acetamin 10-325 MG  60.00 15 Yv Low   [id number]   Wal (4116)   0  40.00 MME  Private Pay   [place]  11/21/2016  1   11/21/2016  Hydrocodone-Acetamin 10-325 MG  60.00 15 Yv Low   [id number]   Wal (4116)   0  40.00 MME  Private Pay   [place]  10/23/2016  1   10/23/2016  Hydrocodone-Acetamin 10-325 MG  60.00 15 Yv Low    [id number]   Wal (4116)   0  40.00 MME  Private Pay   [place]  09/23/2016  1   09/23/2016  Hydrocodone-Acetamin 10-325 MG  60.00 15 Yv Low   [id number]   Wal (4116)   0  40.00 MME  Private Pay     *Per CDC guidance, the MME conversion factors prescribed or provided as part of the medication-assisted treatment for opioid use disorder should not be used to benchmark against dosage thresholds meant for opioids prescribed for pain. Buprenorphine products have no agreed upon morphine equivalency, and as partial opioid agonists, are not expected to be associated with overdose risk in the same dose-dependent manner as doses for full agonist opioids. MME = morphine milligram equivalents. LME = Lorazepam milligram equivalents. MG =  dose in milligrams.  Providers Total Providers: 2  Name Address [facility] [place] Zipcode Phone  DimockJose Ernesto Paz, [place] [address]   [place] [id number]   Donato SchultzYvonne R Lowne Chase, Do [address]   [place] [phone]   Pharmacies Total Pharmacies: 1  Name Address [facility] Zip[facility] Phone  [facility] [phone]           Assessment & Plan:  Plan  I am having Amy Wiley maintain her levonorgestrel, traZODone, HYDROcodone-acetaminophen, and HYDROcodone-acetaminophen.  Meds ordered this encounter  Medications  . HYDROcodone-acetaminophen (NORCO) 10-325 MG tablet    Sig: Take 1 tablet by mouth every 8 (eight) hours as needed.    Dispense:  60 tablet    Refill:  0    Do not fill until 08/17/2018    Problem List Items Addressed This Visit      Unprioritized   Chronic low back pain    Refill pain med uds and contract will be done in 3 months      Relevant Medications   HYDROcodone-acetaminophen (NORCO) 10-325 MG tablet   Idiopathic scoliosis   Relevant Medications   HYDROcodone-acetaminophen (NORCO) 10-325 MG tablet      Follow-up: Return in about 3 months (around 10/31/2018), or if symptoms worsen or fail to improve, for f/u pain med.  Donato SchultzYvonne R  Lowne Chase, DO

## 2018-08-02 NOTE — Patient Instructions (Signed)
Opioid Pain Medicine Information    Opioids are powerful medicines that are used to treat moderate to severe pain. Opioids should be taken with the supervision of a trained health care provider. They should be taken for the shortest period of time as possible. This is because opioids can be addictive and the longer you take opioids, the greater your risk of addiction (opioid use disorder).  What do opioids do?  Opioids help to reduce or eliminate pain. When used for short periods of time, they can help you:  · Sleep better.  · Do better in physical or occupational therapy.  · Feel better in the first few days after an injury.  · Recover from surgery.  What is a pain treatment plan?  A pain treatment plan is an agreement between you and your health care provider. Pain is unique to each person, and treatments vary depending on your condition. To manage your pain successfully, you and your health care provider need to understand each other and work together. To help you do this:  · Discuss the goals of your treatment, including how much pain you might expect to have and how you will manage the pain.  · Review the risks and benefits of taking opioid medicines for your condition.  · Remember that a good treatment plan uses more than one approach and minimizes the chance of side effects.  · Be honest about the amount of medicines you take, and about any drug or alcohol use.  · Get pain medicine prescriptions from only one care provider.  · Keep all follow-up visits as told by your health care provider. This is important.  What instructions should I follow while taking opioid pain medicine?         While you are taking opioid pain medicines, follow these instructions:  · Do not drive.  · Do not use machinery or power tools.  · Do not sign legal documents.  · Do not drink alcohol.  · Do not take sleeping pills.  · Do not supervise children by yourself.  · Do not participate in activities that require climbing or being in  high places.  · Do not enter a body of water--such as a lake, river, ocean, spa, or swimming pool--unless an adult is nearby who can monitor and help you.  What kinds of side effects can opioids cause?  Opioids can cause side effects, such as:  · Constipation.  · Nausea.  · Vomiting.  · Drowsiness.  · Confusion.  · Opioid use disorder.  · Breathing difficulties (respiratory depression).  Using opioid pain medicines for longer than 3 days increases your risk of these side effects.  Taking opioid pain medicine for a long period of time can affect your ability to do daily tasks. It also puts you at risk for:  · Motor vehicle accidents.  · Depression.  · Suicide.  · Heart attack.  · Overdose, which can sometimes lead to death.  What are alternative ways to manage pain?  Pain can be managed with many types of alternative treatments. Ask your health care provider to refer you to one or more specialists who can help you manage pain through:  · Physical or occupational therapy.  · Counseling (cognitive behavioral therapy).  · Good nutrition.  · Biofeedback.  · Massage.  · Meditation.  · Non-opioid medicine.  · Following a gentle exercise program.  How can I keep others safe while I am taking opioid pain medicine?  · Keep   pain medicine in a locked cabinet, or in a secure area where children cannot reach it.  · Never share your pain medicine with anyone.  · Do not save any leftover pills. If you have leftover medicine, you can:  ? Bring the medicine to a prescription take-back program. This is usually offered by the county or law enforcement.  ? Bring it to a pharmacy that has a drug disposal container.  ? Throw it out in the trash. Check the label or package insert of your medicine to see whether this is safe to do. If it is safe to throw it out, remove the medicine from the container and mix it with food or pet waste before putting it in the trash.  How do I stop taking opioids if I have been taking them for a long  time?  If you have been taking opioid medicine for more than a few weeks, you may need to slowly decrease (taper) how much you take until you stop completely. Tapering your use of opioids can decrease your chances of experiencing withdrawal symptoms, such as:  · Pain and cramping in the abdomen.  · Nausea.  · Sweating.  · Sleepiness.  · Restlessness.  · Uncontrollable shaking (tremors).  · Cravings for the medicine.  Do not attempt to taper your use of opioids on your own. Talk with your health care provider about how to do this. Your health care provider may prescribe a step-down schedule based on how much medicine you are taking and how long you have been taking it.  Where to find support  If you have been taking opioids for a long time, you may benefit from receiving support for quitting from a local support group or counselor. Ask your health care provider for a referral to these resources in your area.  Where to find more information  · Centers for Disease Control and Prevention (CDC): www.cdc.gov/drugoverdose/opioids/index.html  Get help right away if:  Seek medical care right away if you are taking opioids and you (or people close to you) notice any of the following:  · Difficulty breathing.  · Breathing that is slower or more shallow than normal.  · A very slow heartbeat (pulse).  · Severe confusion.  · Unconsciousness.  · Sleepiness.  · Slurred speech.  · Nausea and vomiting.  · Cold, clammy skin.  · Blue lips or fingernails.  · Limpness.  · Abnormally small pupils.  If you think that you or someone else may have taken too much of an opioid medicine, get medical help right away. Do not wait to see if the symptoms go away on their own.   If you ever feel like you may hurt yourself or others, or have thoughts about taking your own life, get help right away. You can go to your nearest emergency department or call:  · Your local emergency services (911 in the [place]  · The hotline of the National Poison Control  Center (1-800-222-1222 in the [place]  · A suicide crisis helpline, such as the National Suicide Prevention Lifeline at 1-800-273-8255. This is open 24 hours a day.  Summary  · Opioid medicines can help you manage moderate to severe pain for a short period of time.  · Discuss the goals of your treatment with your health care provider, including how much pain you might expect to have and how you will manage the pain.  · A good treatment plan uses more than one approach. Pain can be managed   with many types of alternative treatments.  · If you think that you or someone else may have taken too much of an opioid, get medical help right away.  This information is not intended to replace advice given to you by your health care provider. Make sure you discuss any questions you have with your health care provider.  Document Released: 06/29/2015 Document Revised: 03/23/2017 Document Reviewed: 01/12/2015  Elsevier Interactive Patient Education © 2019 Elsevier Inc.

## 2018-08-02 NOTE — Assessment & Plan Note (Signed)
Refill pain med uds and contract will be done in 3 months

## 2018-09-09 ENCOUNTER — Encounter: Payer: Self-pay | Admitting: Family Medicine

## 2018-09-09 ENCOUNTER — Other Ambulatory Visit: Payer: Self-pay | Admitting: Family Medicine

## 2018-09-09 DIAGNOSIS — G8929 Other chronic pain: Secondary | ICD-10-CM

## 2018-09-09 DIAGNOSIS — M545 Low back pain: Principal | ICD-10-CM

## 2018-09-09 DIAGNOSIS — M4125 Other idiopathic scoliosis, thoracolumbar region: Secondary | ICD-10-CM

## 2018-09-09 MED ORDER — HYDROCODONE-ACETAMINOPHEN 10-325 MG PO TABS: 1.0000 | ORAL_TABLET | Freq: Three times a day (TID) | ORAL | 0 refills | Status: DC | PRN

## 2018-10-11 ENCOUNTER — Other Ambulatory Visit: Payer: Self-pay | Admitting: Family Medicine

## 2018-10-11 ENCOUNTER — Encounter: Payer: Self-pay | Admitting: Family Medicine

## 2018-10-11 DIAGNOSIS — M4125 Other idiopathic scoliosis, thoracolumbar region: Secondary | ICD-10-CM

## 2018-10-11 DIAGNOSIS — G8929 Other chronic pain: Secondary | ICD-10-CM

## 2018-10-11 DIAGNOSIS — M545 Low back pain: Principal | ICD-10-CM

## 2018-10-11 MED ORDER — HYDROCODONE-ACETAMINOPHEN 10-325 MG PO TABS: 1.0000 | ORAL_TABLET | Freq: Three times a day (TID) | ORAL | 0 refills | Status: DC | PRN

## 2018-10-11 NOTE — Telephone Encounter (Signed)
Sent in

## 2018-10-11 NOTE — Telephone Encounter (Signed)
Last Hydrocodone RX: 09/09/18, #60 Last OV: 08/02/18 Next OV: not scheduled UDS: 12/31/17, low risk past due CSC: 12/31/17

## 2018-11-09 ENCOUNTER — Other Ambulatory Visit: Payer: Self-pay | Admitting: Family Medicine

## 2018-11-09 ENCOUNTER — Encounter: Payer: Self-pay | Admitting: Family Medicine

## 2018-11-09 DIAGNOSIS — G8929 Other chronic pain: Secondary | ICD-10-CM

## 2018-11-09 DIAGNOSIS — M545 Low back pain, unspecified: Secondary | ICD-10-CM

## 2018-11-09 DIAGNOSIS — M4125 Other idiopathic scoliosis, thoracolumbar region: Secondary | ICD-10-CM

## 2018-11-09 MED ORDER — HYDROCODONE-ACETAMINOPHEN 10-325 MG PO TABS: 1.0000 | ORAL_TABLET | Freq: Three times a day (TID) | ORAL | 0 refills | Status: DC | PRN

## 2018-11-09 NOTE — Telephone Encounter (Signed)
Hydrocodone refill.   Last OV: 08/02/2018 Last Fill: 10/11/2018 #60 and 0RF UDS: 12/31/2017 Low risk

## 2018-11-09 NOTE — Telephone Encounter (Signed)
It's been refilled.  

## 2018-12-06 ENCOUNTER — Encounter: Payer: Self-pay | Admitting: Family Medicine

## 2018-12-06 DIAGNOSIS — M4125 Other idiopathic scoliosis, thoracolumbar region: Secondary | ICD-10-CM

## 2018-12-06 DIAGNOSIS — G8929 Other chronic pain: Secondary | ICD-10-CM

## 2018-12-06 MED ORDER — HYDROCODONE-ACETAMINOPHEN 10-325 MG PO TABS: 1.0000 | ORAL_TABLET | Freq: Three times a day (TID) | ORAL | 0 refills | Status: DC | PRN

## 2018-12-06 NOTE — Telephone Encounter (Signed)
Hydrocodone.   Last OV: 08/02/2018 Last Fill: 11/09/2018 #60 and 0RF UDS; 12/31/2017 Low risk

## 2019-01-03 ENCOUNTER — Encounter: Payer: Self-pay | Admitting: Family Medicine

## 2019-01-03 DIAGNOSIS — G8929 Other chronic pain: Secondary | ICD-10-CM

## 2019-01-03 DIAGNOSIS — M545 Low back pain, unspecified: Secondary | ICD-10-CM

## 2019-01-03 DIAGNOSIS — M4125 Other idiopathic scoliosis, thoracolumbar region: Secondary | ICD-10-CM

## 2019-01-03 MED ORDER — HYDROCODONE-ACETAMINOPHEN 10-325 MG PO TABS: 1.0000 | ORAL_TABLET | Freq: Three times a day (TID) | ORAL | 0 refills | Status: DC | PRN

## 2019-01-03 NOTE — Telephone Encounter (Signed)
Requesting: NORCO Contract: 12/31/2017 UDS: 12/31/2017, low risk, next screen, 07/03/2018 Last OV: 08/02/2018 Next OV: N/A Last Refill: 12/06/2018, #60--0 RF Database:   Please advise

## 2019-01-20 ENCOUNTER — Encounter: Payer: Self-pay | Admitting: Family Medicine

## 2019-01-24 NOTE — Telephone Encounter (Signed)
Pt can have menactra #2  And bexsero #1   I don't thinks pharmacy does this Please put this in pt chart-- Amy Wiley , no her mom

## 2019-02-02 ENCOUNTER — Encounter: Payer: Self-pay | Admitting: Family Medicine

## 2019-02-02 ENCOUNTER — Ambulatory Visit (INDEPENDENT_AMBULATORY_CARE_PROVIDER_SITE_OTHER): Payer: BC Managed Care – PPO | Admitting: Family Medicine

## 2019-02-02 ENCOUNTER — Other Ambulatory Visit: Payer: Self-pay

## 2019-02-02 DIAGNOSIS — M4125 Other idiopathic scoliosis, thoracolumbar region: Secondary | ICD-10-CM

## 2019-02-02 DIAGNOSIS — G8929 Other chronic pain: Secondary | ICD-10-CM

## 2019-02-02 DIAGNOSIS — G4700 Insomnia, unspecified: Secondary | ICD-10-CM | POA: Diagnosis not present

## 2019-02-02 DIAGNOSIS — M545 Low back pain, unspecified: Secondary | ICD-10-CM

## 2019-02-02 MED ORDER — HYDROCODONE-ACETAMINOPHEN 10-325 MG PO TABS: 1.0000 | ORAL_TABLET | Freq: Three times a day (TID) | ORAL | 0 refills | Status: DC | PRN

## 2019-02-02 MED ORDER — TRAZODONE HCL 50 MG PO TABS: ORAL_TABLET | ORAL | 1 refills | Status: DC

## 2019-02-02 NOTE — Progress Notes (Signed)
Virtual Visit via Video Note  I connected with Amy Wiley on 02/02/19 at  9:20 AM EDT by a video enabled telemedicine application and verified that I am speaking with the correct person using two identifiers.  Location: Patient: home Provider: home    I discussed the limitations of evaluation and management by telemedicine and the availability of in person appointments. The patient expressed understanding and agreed to proceed.  History of Present Illness: Pt is home with no complaints.  She just needs a refill on her chronic meds.  She is doing well.  No recent illness etc.     Observations/Objective: No vitals obtained  Pt is in NAD   Assessment and Plan: 1. Insomnia, unspecified type Stable  Refill meds  Pt c/o no side effects  - traZODone (DESYREL) 50 MG tablet; 1 po qhs prn  Dispense: 90 tablet; Refill: 1  2. Chronic low back pain Stable---- no worse Refill med datatbase reviewed uds utd  - HYDROcodone-acetaminophen (NORCO) 10-325 MG tablet; Take 1 tablet by mouth every 8 (eight) hours as needed.  Dispense: 60 tablet; Refill: 0  3. Other idiopathic scoliosis, thoracolumbar region Stable Refill pain med  - HYDROcodone-acetaminophen (NORCO) 10-325 MG tablet; Take 1 tablet by mouth every 8 (eight) hours as needed.  Dispense: 60 tablet; Refill: 0   Follow Up Instructions:    I discussed the assessment and treatment plan with the patient. The patient was provided an opportunity to ask questions and all were answered. The patient agreed with the plan and demonstrated an understanding of the instructions.   The patient was advised to call back or seek an in-person evaluation if the symptoms worsen or if the condition fails to improve as anticipated.  I provided 15 minutes of non-face-to-face time during this encounter.   Ann Held, DO

## 2019-02-02 NOTE — Telephone Encounter (Signed)
Pt made virtual visit for this morning

## 2019-02-28 ENCOUNTER — Encounter: Payer: Self-pay | Admitting: Family Medicine

## 2019-02-28 DIAGNOSIS — M545 Low back pain, unspecified: Secondary | ICD-10-CM

## 2019-02-28 DIAGNOSIS — G8929 Other chronic pain: Secondary | ICD-10-CM

## 2019-02-28 DIAGNOSIS — M4125 Other idiopathic scoliosis, thoracolumbar region: Secondary | ICD-10-CM

## 2019-02-28 MED ORDER — HYDROCODONE-ACETAMINOPHEN 10-325 MG PO TABS: 1.0000 | ORAL_TABLET | Freq: Three times a day (TID) | ORAL | 0 refills | Status: DC | PRN

## 2019-02-28 NOTE — Telephone Encounter (Signed)
Requesting: NORCO Contract: 12/31/2017 UDS: 12/31/2017 Last OV: 02/02/2019 Next OV: N/A Last Refill: 02/02/2019, #60--0 RF Database:   Please advise

## 2019-03-17 DIAGNOSIS — Z6824 Body mass index (BMI) 24.0-24.9, adult: Secondary | ICD-10-CM | POA: Diagnosis not present

## 2019-03-17 DIAGNOSIS — Z124 Encounter for screening for malignant neoplasm of cervix: Secondary | ICD-10-CM | POA: Diagnosis not present

## 2019-03-17 DIAGNOSIS — Z01419 Encounter for gynecological examination (general) (routine) without abnormal findings: Secondary | ICD-10-CM | POA: Diagnosis not present

## 2019-03-28 ENCOUNTER — Encounter: Payer: Self-pay | Admitting: Family Medicine

## 2019-03-28 ENCOUNTER — Other Ambulatory Visit: Payer: Self-pay | Admitting: Family Medicine

## 2019-03-28 DIAGNOSIS — G8929 Other chronic pain: Secondary | ICD-10-CM

## 2019-03-28 DIAGNOSIS — M4125 Other idiopathic scoliosis, thoracolumbar region: Secondary | ICD-10-CM

## 2019-03-28 MED ORDER — HYDROCODONE-ACETAMINOPHEN 10-325 MG PO TABS: 1.0000 | ORAL_TABLET | Freq: Three times a day (TID) | ORAL | 0 refills | Status: DC | PRN

## 2019-04-25 ENCOUNTER — Encounter: Payer: Self-pay | Admitting: Family Medicine

## 2019-04-25 DIAGNOSIS — M4125 Other idiopathic scoliosis, thoracolumbar region: Secondary | ICD-10-CM

## 2019-04-25 DIAGNOSIS — G8929 Other chronic pain: Secondary | ICD-10-CM

## 2019-04-25 MED ORDER — HYDROCODONE-ACETAMINOPHEN 10-325 MG PO TABS: 1.0000 | ORAL_TABLET | Freq: Three times a day (TID) | ORAL | 0 refills | Status: DC | PRN

## 2019-04-25 NOTE — Telephone Encounter (Signed)
Requesting: NORCO Contract: 12/31/2017 UDS: 12/31/2017, low risk Last OV: 02/02/2019 Next OV: N/A Last Refill: 03/28/2019, #60--0 RF Database:   Please advise

## 2019-05-24 ENCOUNTER — Encounter: Payer: Self-pay | Admitting: Family Medicine

## 2019-05-24 DIAGNOSIS — G8929 Other chronic pain: Secondary | ICD-10-CM

## 2019-05-24 DIAGNOSIS — M545 Low back pain: Secondary | ICD-10-CM

## 2019-05-24 DIAGNOSIS — M4125 Other idiopathic scoliosis, thoracolumbar region: Secondary | ICD-10-CM

## 2019-05-25 NOTE — Telephone Encounter (Signed)
Requesting: [facility]CO Contract: 12/31/2017 UDS: 12/31/2017 Last OV: 02/02/2019  Next OV: N/A Last Refill: 04/25/2019, #60--0 RF Database:   Please advise

## 2019-05-26 NOTE — Progress Notes (Signed)
Pt wants to know if refill was declined, wants FU call if appt needs, last 8/19 [place]

## 2019-05-27 ENCOUNTER — Other Ambulatory Visit: Payer: Self-pay

## 2019-05-27 NOTE — Telephone Encounter (Signed)
Pt needs ov for f/u

## 2019-05-30 ENCOUNTER — Ambulatory Visit (INDEPENDENT_AMBULATORY_CARE_PROVIDER_SITE_OTHER): Payer: BC Managed Care – PPO | Admitting: Family Medicine

## 2019-05-30 ENCOUNTER — Encounter: Payer: Self-pay | Admitting: Family Medicine

## 2019-05-30 ENCOUNTER — Other Ambulatory Visit: Payer: Self-pay

## 2019-05-30 VITALS — BP 100/72 | HR 72 | Temp 97.9°F | Resp 18 | Ht 60.0 in | Wt 126.0 lb

## 2019-05-30 DIAGNOSIS — Z79899 Other long term (current) drug therapy: Secondary | ICD-10-CM

## 2019-05-30 DIAGNOSIS — G8929 Other chronic pain: Secondary | ICD-10-CM

## 2019-05-30 DIAGNOSIS — M4125 Other idiopathic scoliosis, thoracolumbar region: Secondary | ICD-10-CM | POA: Diagnosis not present

## 2019-05-30 DIAGNOSIS — M545 Low back pain, unspecified: Secondary | ICD-10-CM

## 2019-05-30 MED ORDER — HYDROCODONE-ACETAMINOPHEN 10-325 MG PO TABS: 1.0000 | ORAL_TABLET | Freq: Three times a day (TID) | ORAL | 0 refills | Status: DC | PRN

## 2019-05-30 NOTE — Progress Notes (Signed)
Patient ID: Amy Wiley, female    DOB: 05/09/1971  Age: 48 y.o. MRN: [id number]    Subjective:  Subjective  HPI Amy Wiley presents for refill pain meds, uds and contract  Review of Systems  Constitutional: Negative for appetite change, diaphoresis, fatigue and unexpected weight change.  Eyes: Negative for pain, redness and visual disturbance.  Respiratory: Negative for cough, chest tightness, shortness of breath and wheezing.   Cardiovascular: Negative for chest pain, palpitations and leg swelling.  Endocrine: Negative for cold intolerance, heat intolerance, polydipsia, polyphagia and polyuria.  Genitourinary: Negative for difficulty urinating, dysuria and frequency.  Neurological: Negative for dizziness, light-headedness, numbness and headaches.    History Past Medical History:  Diagnosis Date  . Abnormal Pap smear   . ADD (attention deficit disorder)   . Anxiety     She has a past surgical history that includes Wisdom tooth extraction.   Her family history includes ALS in her paternal grandmother; Heart attack in her paternal grandmother and paternal uncle; Heart disease in her paternal grandfather and paternal grandmother; Heart disease (age of onset: 53) in her paternal uncle; Hypertension in her father.She reports that she has been smoking cigarettes. She has never used smokeless tobacco. She reports that she does not drink alcohol or use drugs.  Current Outpatient Medications on File Prior to Visit  Medication Sig Dispense Refill  . levonorgestrel (MIRENA) 20 MCG/24HR IUD 1 each by Intrauterine route once.      . traZODone (DESYREL) 50 MG tablet 1 po qhs prn 90 tablet 1   No current facility-administered medications on file prior to visit.     Objective:  Objective  Physical Exam Vitals and nursing note reviewed.  Constitutional:      Appearance: She is well-developed.  HENT:     Head: Normocephalic and atraumatic.  Eyes:     Conjunctiva/sclera:  Conjunctivae normal.  Neck:     Thyroid: No thyromegaly.     Vascular: No carotid bruit or JVD.  Cardiovascular:     Rate and Rhythm: Normal rate and regular rhythm.     Heart sounds: Normal heart sounds. No murmur.  Pulmonary:     Effort: Pulmonary effort is normal. No respiratory distress.     Breath sounds: Normal breath sounds. No wheezing or rales.  Chest:     Chest wall: No tenderness.  Musculoskeletal:     Cervical back: Normal range of motion and neck supple.  Neurological:     Mental Status: She is alert and oriented to person, place, and time.    BP 100/72 (BP Location: Right Arm, Patient Position: Sitting, Cuff Size: Normal)   Pulse 72   Temp 97.9 F (36.6 C) (Temporal)   Resp 18   Ht 5' (1.524 m)   Wt 126 lb (57.2 kg)   SpO2 98%   BMI 24.61 kg/m  Wt Readings from Last 3 Encounters:  05/30/19 126 lb (57.2 kg)  08/02/18 115 lb (52.2 kg)  04/20/18 117 lb 12.8 oz (53.4 kg)     Lab Results  Component Value Date   WBC 8.2 02/08/2013   HGB 12.6 02/08/2013   HCT 37.0 02/08/2013   PLT 243.0 02/08/2013   GLUCOSE 85 02/08/2013   CHOL 198 02/08/2013   TRIG 100.0 02/08/2013   HDL 60.00 02/08/2013   LDLCALC 118 (H) 02/08/2013   ALT 23 02/08/2013   AST 17 02/08/2013   NA 136 02/08/2013   K 3.6 02/08/2013   CL 104 02/08/2013   CREATININE  0.7 02/08/2013   BUN 9 02/08/2013   CO2 29 02/08/2013   TSH 0.77 02/08/2013    DG Foot Complete Right  Result Date: 08/02/2015 CLINICAL DATA:  Right foot plantar pain no known injury EXAM: RIGHT FOOT COMPLETE - 3+ VIEW COMPARISON:  None. FINDINGS: Three views of the right foot submitted. No acute fracture or subluxation. There is small plantar spur of calcaneus. IMPRESSION: No acute fracture or subluxation.  Small plantar spur of calcaneus. Electronically Signed   By: Lahoma Crocker M.D.   On: 08/02/2015 14:59     Assessment & Plan:  Plan  I am having Amy Wiley maintain her levonorgestrel, traZODone, and  HYDROcodone-acetaminophen.  Meds ordered this encounter  Medications  . HYDROcodone-acetaminophen (NORCO) 10-325 MG tablet    Sig: Take 1 tablet by mouth every 8 (eight) hours as needed.    Dispense:  60 tablet    Refill:  0    Do not fill until 04/16/2018    Problem List Items Addressed This Visit      Unprioritized   Chronic low back pain   Relevant Medications   HYDROcodone-acetaminophen (NORCO) 10-325 MG tablet   Idiopathic scoliosis   Relevant Medications   HYDROcodone-acetaminophen (NORCO) 10-325 MG tablet    Other Visit Diagnoses    High risk medication use    -  Primary   Relevant Orders   Pain Mgmt, Profile 8 w/Conf, U      Follow-up: Return in about 3 months (around 08/28/2019), or if symptoms worsen or fail to improve, for --------------------------.  Ann Held, DO

## 2019-06-02 LAB — PAIN MGMT, PROFILE 8 W/CONF, U
6 Acetylmorphine: NEGATIVE ng/mL
Alcohol Metabolites: NEGATIVE ng/mL (ref <500)
Alphahydroxyalprazolam: 26 ng/mL
Alphahydroxymidazolam: NEGATIVE ng/mL
Alphahydroxytriazolam: NEGATIVE ng/mL
Aminoclonazepam: NEGATIVE ng/mL
Amphetamines: NEGATIVE ng/mL
Benzodiazepines: POSITIVE ng/mL
Buprenorphine, Urine: NEGATIVE ng/mL
Buprenorphine: NEGATIVE ng/mL
Cocaine Metabolite: NEGATIVE ng/mL
Creatinine: 210.9 mg/dL
Hydroxyethylflurazepam: NEGATIVE ng/mL
Lorazepam: NEGATIVE ng/mL
MDMA: NEGATIVE ng/mL
Marijuana Metabolite: NEGATIVE ng/mL
Norbuprenorphine: NEGATIVE ng/mL
Nordiazepam: NEGATIVE ng/mL
Opiates: NEGATIVE ng/mL
Oxazepam: NEGATIVE ng/mL
Oxidant: NEGATIVE ug/mL
Oxycodone: NEGATIVE ng/mL
Temazepam: NEGATIVE ng/mL
pH: 5.7 (ref 4.5–9.0)

## 2019-06-27 ENCOUNTER — Encounter: Payer: Self-pay | Admitting: Family Medicine

## 2019-06-27 DIAGNOSIS — M4125 Other idiopathic scoliosis, thoracolumbar region: Secondary | ICD-10-CM

## 2019-06-27 DIAGNOSIS — G8929 Other chronic pain: Secondary | ICD-10-CM

## 2019-06-27 MED ORDER — HYDROCODONE-ACETAMINOPHEN 10-325 MG PO TABS: 1.0000 | ORAL_TABLET | Freq: Three times a day (TID) | ORAL | 0 refills | Status: DC | PRN

## 2019-06-27 NOTE — Telephone Encounter (Signed)
Requesting: [facility]CO Contract: 05/30/2019 UDS: 05/30/2019 Last OV: 05/30/2019 Next OV: N/A Last Refill: 05/30/2019, #60--0 RF Database:   Please advise

## 2019-07-25 ENCOUNTER — Encounter: Payer: Self-pay | Admitting: Family Medicine

## 2019-07-25 DIAGNOSIS — M4125 Other idiopathic scoliosis, thoracolumbar region: Secondary | ICD-10-CM

## 2019-07-25 DIAGNOSIS — G4700 Insomnia, unspecified: Secondary | ICD-10-CM

## 2019-07-25 DIAGNOSIS — G8929 Other chronic pain: Secondary | ICD-10-CM

## 2019-07-25 MED ORDER — HYDROCODONE-ACETAMINOPHEN 10-325 MG PO TABS: 1.0000 | ORAL_TABLET | Freq: Three times a day (TID) | ORAL | 0 refills | Status: DC | PRN

## 2019-07-25 MED ORDER — TRAZODONE HCL 50 MG PO TABS: ORAL_TABLET | ORAL | 1 refills | Status: DC

## 2019-07-25 NOTE — Telephone Encounter (Signed)
Requesting: [facility]CO Contract:  UDS: 05/30/2019, low risk Last OV: 05/30/19 Next OV: N/A Last Refill: 06/27/19, #60--0 RF Database:   Please advise

## 2019-08-22 ENCOUNTER — Other Ambulatory Visit: Payer: Self-pay | Admitting: Family Medicine

## 2019-08-22 ENCOUNTER — Telehealth: Payer: Self-pay

## 2019-08-22 ENCOUNTER — Encounter: Payer: Self-pay | Admitting: Family Medicine

## 2019-08-22 DIAGNOSIS — G8929 Other chronic pain: Secondary | ICD-10-CM

## 2019-08-22 DIAGNOSIS — M4125 Other idiopathic scoliosis, thoracolumbar region: Secondary | ICD-10-CM

## 2019-08-22 MED ORDER — HYDROCODONE-ACETAMINOPHEN 10-325 MG PO TABS: 1.0000 | ORAL_TABLET | Freq: Three times a day (TID) | ORAL | 0 refills | Status: DC | PRN

## 2019-08-22 NOTE — Telephone Encounter (Signed)
sent 

## 2019-08-22 NOTE — Telephone Encounter (Signed)
Requesting: Hydrocodone Contract: Yes UDS: UTD Last OV: 12.14.2020 Next OV: Not scheduled Last Refill: 2.8.21   Please advise

## 2019-09-21 ENCOUNTER — Encounter: Payer: Self-pay | Admitting: Family Medicine

## 2019-09-21 DIAGNOSIS — G8929 Other chronic pain: Secondary | ICD-10-CM

## 2019-09-21 DIAGNOSIS — M4125 Other idiopathic scoliosis, thoracolumbar region: Secondary | ICD-10-CM

## 2019-09-21 MED ORDER — HYDROCODONE-ACETAMINOPHEN 10-325 MG PO TABS: 1.0000 | ORAL_TABLET | Freq: Three times a day (TID) | ORAL | 0 refills | Status: DC | PRN

## 2019-09-21 NOTE — Telephone Encounter (Signed)
Hydrocodone refill.   Last OV: 05/30/2019 Last Fill: 08/22/2019 #60 and 0RF Pt sig: 1 tab q8h prn UDS: 05/30/2019 Low risk

## 2019-10-20 ENCOUNTER — Encounter: Payer: Self-pay | Admitting: Family Medicine

## 2019-10-20 DIAGNOSIS — G8929 Other chronic pain: Secondary | ICD-10-CM

## 2019-10-20 DIAGNOSIS — M4125 Other idiopathic scoliosis, thoracolumbar region: Secondary | ICD-10-CM

## 2019-10-20 MED ORDER — HYDROCODONE-ACETAMINOPHEN 10-325 MG PO TABS: 1.0000 | ORAL_TABLET | Freq: Three times a day (TID) | ORAL | 0 refills | Status: DC | PRN

## 2019-10-20 NOTE — Telephone Encounter (Signed)
Can you refill in PCP absence?  

## 2019-11-21 ENCOUNTER — Other Ambulatory Visit: Payer: Self-pay | Admitting: Family Medicine

## 2019-11-21 ENCOUNTER — Encounter: Payer: Self-pay | Admitting: Family Medicine

## 2019-11-21 DIAGNOSIS — M4125 Other idiopathic scoliosis, thoracolumbar region: Secondary | ICD-10-CM

## 2019-11-21 DIAGNOSIS — M545 Low back pain, unspecified: Secondary | ICD-10-CM

## 2019-11-21 DIAGNOSIS — G8929 Other chronic pain: Secondary | ICD-10-CM

## 2019-11-21 MED ORDER — HYDROCODONE-ACETAMINOPHEN 10-325 MG PO TABS: 1.0000 | ORAL_TABLET | Freq: Three times a day (TID) | ORAL | 0 refills | Status: DC | PRN

## 2019-11-21 NOTE — Telephone Encounter (Signed)
Refill sent but pt due for ov

## 2019-11-21 NOTE — Telephone Encounter (Signed)
Hydrocodone refill.   Last OV: 05/30/2019 Last Fill: 5/6/202 #60 and 0RF Pt sig: 1 tab q8h prn UDS: 05/30/2019 Low risk

## 2019-11-29 ENCOUNTER — Encounter: Payer: Self-pay | Admitting: Family Medicine

## 2019-11-29 ENCOUNTER — Other Ambulatory Visit: Payer: Self-pay

## 2019-11-29 ENCOUNTER — Telehealth (INDEPENDENT_AMBULATORY_CARE_PROVIDER_SITE_OTHER): Payer: BC Managed Care – PPO | Admitting: Family Medicine

## 2019-11-29 DIAGNOSIS — M4120 Other idiopathic scoliosis, site unspecified: Secondary | ICD-10-CM | POA: Diagnosis not present

## 2019-11-29 DIAGNOSIS — F988 Other specified behavioral and emotional disorders with onset usually occurring in childhood and adolescence: Secondary | ICD-10-CM

## 2019-11-29 NOTE — Progress Notes (Signed)
Virtual Visit via Video Note  I connected with Amy Wiley on 11/29/19 at  3:20 PM EDT by a video enabled telemedicine application and verified that I am speaking with the correct person using two identifiers.  Location: Patient: in car  Provider: office   I discussed the limitations of evaluation and management by telemedicine and the availability of in person appointments. The patient expressed understanding and agreed to proceed.  History of Present Illness: Pt is f/u on pain meds.  She uses them rarely and has no complaints.  She is asking about going back on adderall but we can not do that in combo with  hydrocodone  No other complaints  Observations/Objective: Vitals:   11/29/19 1523  Height: 5' (1.524 m)  pt is in nad  Assessment and Plan: 1. Other idiopathic scoliosis, unspecified spinal region Database reviewed uds to be updated Contract udt No problems with med--- refilled last week  2. Attention deficit disorder (ADD) without hyperactivity Unable to refill adderall due to opioid D/w pt straterra --- pt will think about it   Follow Up Instructions:    I discussed the assessment and treatment plan with the patient. The patient was provided an opportunity to ask questions and all were answered. The patient agreed with the plan and demonstrated an understanding of the instructions.   The patient was advised to call back or seek an in-person evaluation if the symptoms worsen or if the condition fails to improve as anticipated.  I provided 25 minutes of non-face-to-face time during this encounter.   Donato Schultz, DO

## 2019-11-29 NOTE — Assessment & Plan Note (Signed)
D/w pt strattera -- pt will think about it and get back to [place]

## 2019-11-29 NOTE — Assessment & Plan Note (Signed)
uds needs to be updated Contract utd Database reviewed

## 2019-12-20 ENCOUNTER — Encounter: Payer: Self-pay | Admitting: Family Medicine

## 2019-12-20 DIAGNOSIS — G8929 Other chronic pain: Secondary | ICD-10-CM

## 2019-12-20 DIAGNOSIS — M4125 Other idiopathic scoliosis, thoracolumbar region: Secondary | ICD-10-CM

## 2019-12-20 MED ORDER — HYDROCODONE-ACETAMINOPHEN 10-325 MG PO TABS: 1.0000 | ORAL_TABLET | Freq: Three times a day (TID) | ORAL | 0 refills | Status: DC | PRN

## 2019-12-20 NOTE — Telephone Encounter (Signed)
Hydrocodone refill.   Last OV: 11/29/2019  Last Fill: 11/21/2019 #60 and 0RF Pt sig: 1 tab q8h prn UDS: 05/30/2019 Low risk

## 2019-12-21 ENCOUNTER — Telehealth: Payer: Self-pay

## 2019-12-21 NOTE — Telephone Encounter (Signed)
PA initiated via Covermymeds; KEY: [id number]. PA approved.

## 2020-01-09 ENCOUNTER — Telehealth: Payer: Self-pay | Admitting: Family Medicine

## 2020-01-09 ENCOUNTER — Other Ambulatory Visit: Payer: Self-pay | Admitting: Family Medicine

## 2020-01-09 DIAGNOSIS — Z79899 Other long term (current) drug therapy: Secondary | ICD-10-CM

## 2020-01-09 NOTE — Telephone Encounter (Signed)
Patient needs an order placed for Urine drug screen. Patient will come in on Wednesday.

## 2020-01-09 NOTE — Telephone Encounter (Signed)
Ive tried putting it in and it won't let me--- I must be picking the wrong one--- would you help?  sorry

## 2020-01-10 NOTE — Telephone Encounter (Signed)
Order placed as future

## 2020-01-11 ENCOUNTER — Other Ambulatory Visit: Payer: BC Managed Care – PPO

## 2020-01-11 ENCOUNTER — Other Ambulatory Visit: Payer: Self-pay

## 2020-01-11 DIAGNOSIS — Z79899 Other long term (current) drug therapy: Secondary | ICD-10-CM

## 2020-01-14 LAB — DRUG MONITORING, PANEL 8 WITH CONFIRMATION, URINE
6 Acetylmorphine: NEGATIVE ng/mL (ref <10)
Alcohol Metabolites: POSITIVE ng/mL — AB
Amphetamines: NEGATIVE ng/mL (ref <500)
Benzodiazepines: NEGATIVE ng/mL (ref <100)
Buprenorphine, Urine: NEGATIVE ng/mL (ref <5)
Cocaine Metabolite: NEGATIVE ng/mL (ref <150)
Codeine: NEGATIVE ng/mL (ref <50)
Creatinine: 165.4 mg/dL
Ethyl Glucuronide (ETG): 8366 ng/mL — ABNORMAL HIGH (ref <500)
Ethyl Sulfate (ETS): 2326 ng/mL — ABNORMAL HIGH (ref <100)
Hydrocodone: 110 ng/mL — ABNORMAL HIGH (ref <50)
Hydromorphone: 123 ng/mL — ABNORMAL HIGH (ref <50)
MDMA: NEGATIVE ng/mL (ref <500)
Marijuana Metabolite: NEGATIVE ng/mL (ref <20)
Morphine: NEGATIVE ng/mL (ref <50)
Norhydrocodone: 547 ng/mL — ABNORMAL HIGH (ref <50)
Opiates: POSITIVE ng/mL — AB (ref <100)
Oxidant: NEGATIVE ug/mL
Oxycodone: NEGATIVE ng/mL (ref <100)
pH: 6.2 (ref 4.5–9.0)

## 2020-01-14 LAB — DM TEMPLATE

## 2020-01-17 ENCOUNTER — Encounter: Payer: Self-pay | Admitting: Family Medicine

## 2020-01-17 DIAGNOSIS — G8929 Other chronic pain: Secondary | ICD-10-CM

## 2020-01-17 DIAGNOSIS — M4125 Other idiopathic scoliosis, thoracolumbar region: Secondary | ICD-10-CM

## 2020-01-17 MED ORDER — HYDROCODONE-ACETAMINOPHEN 10-325 MG PO TABS: 1.0000 | ORAL_TABLET | Freq: Three times a day (TID) | ORAL | 0 refills | Status: DC | PRN

## 2020-01-17 NOTE — Telephone Encounter (Signed)
Hydrocodone refill.   Last OV :11/29/2019 Last Fill: 12/20/2019 #60 and 0RF Pt sig: 1 tab q8h prn UDS: 01/11/2020 Moderate risk

## 2020-02-15 ENCOUNTER — Encounter: Payer: Self-pay | Admitting: Family Medicine

## 2020-02-15 ENCOUNTER — Other Ambulatory Visit: Payer: Self-pay | Admitting: Family Medicine

## 2020-02-15 DIAGNOSIS — M4125 Other idiopathic scoliosis, thoracolumbar region: Secondary | ICD-10-CM

## 2020-02-15 DIAGNOSIS — G8929 Other chronic pain: Secondary | ICD-10-CM

## 2020-02-15 MED ORDER — HYDROCODONE-ACETAMINOPHEN 10-325 MG PO TABS: 1.0000 | ORAL_TABLET | Freq: Three times a day (TID) | ORAL | 0 refills | Status: DC | PRN

## 2020-02-15 NOTE — Telephone Encounter (Signed)
Refilled  Amy Schultz, DO

## 2020-02-15 NOTE — Telephone Encounter (Signed)
Requesting: hydrocodone  Contract: 06/15/2019 UDS:01/11/20 Last Visit:05/30/2019 Next Visit:n/a Last Refill:01/17/20  Please Advise

## 2020-03-16 ENCOUNTER — Encounter: Payer: Self-pay | Admitting: Family Medicine

## 2020-03-16 DIAGNOSIS — G8929 Other chronic pain: Secondary | ICD-10-CM

## 2020-03-16 DIAGNOSIS — M4125 Other idiopathic scoliosis, thoracolumbar region: Secondary | ICD-10-CM

## 2020-03-16 MED ORDER — HYDROCODONE-ACETAMINOPHEN 10-325 MG PO TABS: 1.0000 | ORAL_TABLET | Freq: Three times a day (TID) | ORAL | 0 refills | Status: DC | PRN

## 2020-03-16 NOTE — Telephone Encounter (Signed)
Requesting: [facility]CO Contract: 01/11/20 UDS: 01/11/20, mod risk Last OV: 11/29/19--virtual Next OV: N/A Last Refill: 02/15/20, #60--0 RF Database:   Please advise

## 2020-04-11 ENCOUNTER — Other Ambulatory Visit: Payer: Self-pay | Admitting: Family Medicine

## 2020-04-11 DIAGNOSIS — G4700 Insomnia, unspecified: Secondary | ICD-10-CM

## 2020-04-16 ENCOUNTER — Encounter: Payer: Self-pay | Admitting: Family Medicine

## 2020-04-16 DIAGNOSIS — M4125 Other idiopathic scoliosis, thoracolumbar region: Secondary | ICD-10-CM

## 2020-04-16 DIAGNOSIS — G8929 Other chronic pain: Secondary | ICD-10-CM

## 2020-04-16 DIAGNOSIS — Z30432 Encounter for removal of intrauterine contraceptive device: Secondary | ICD-10-CM | POA: Diagnosis not present

## 2020-04-16 DIAGNOSIS — M545 Low back pain, unspecified: Secondary | ICD-10-CM

## 2020-04-16 MED ORDER — HYDROCODONE-ACETAMINOPHEN 10-325 MG PO TABS: 1.0000 | ORAL_TABLET | Freq: Three times a day (TID) | ORAL | 0 refills | Status: DC | PRN

## 2020-04-16 NOTE — Telephone Encounter (Signed)
Requesting: hydrocodone 10-325mg  Contract: 05/30/2019 UDS: 01/11/2020  Last Visit: 11/29/2019 Next Visit: None scheduled Last Refill: 03/16/2020 #60 and 0RF Pt sig: 1 tab q8h prn  Please Advise

## 2020-05-02 DIAGNOSIS — Z6827 Body mass index (BMI) 27.0-27.9, adult: Secondary | ICD-10-CM | POA: Diagnosis not present

## 2020-05-02 DIAGNOSIS — Z1231 Encounter for screening mammogram for malignant neoplasm of breast: Secondary | ICD-10-CM | POA: Diagnosis not present

## 2020-05-02 DIAGNOSIS — Z01419 Encounter for gynecological examination (general) (routine) without abnormal findings: Secondary | ICD-10-CM | POA: Diagnosis not present

## 2020-05-15 ENCOUNTER — Encounter: Payer: Self-pay | Admitting: Family Medicine

## 2020-05-15 ENCOUNTER — Other Ambulatory Visit: Payer: Self-pay | Admitting: Family Medicine

## 2020-05-15 DIAGNOSIS — M4125 Other idiopathic scoliosis, thoracolumbar region: Secondary | ICD-10-CM

## 2020-05-15 DIAGNOSIS — G8929 Other chronic pain: Secondary | ICD-10-CM

## 2020-05-15 DIAGNOSIS — M545 Low back pain, unspecified: Secondary | ICD-10-CM

## 2020-05-15 MED ORDER — HYDROCODONE-ACETAMINOPHEN 10-325 MG PO TABS: 1.0000 | ORAL_TABLET | Freq: Three times a day (TID) | ORAL | 0 refills | Status: DC | PRN

## 2020-05-15 NOTE — Telephone Encounter (Signed)
Requesting: [facility]CO Contract: 01/11/20 UDS: 01/11/20, Moderate risk Last OV: 11/29/19 Next OV: N/A Last Refill: 04/16/20, #60--0 RF Database:   Please advise

## 2020-06-13 ENCOUNTER — Encounter: Payer: Self-pay | Admitting: Family Medicine

## 2020-06-13 DIAGNOSIS — M4125 Other idiopathic scoliosis, thoracolumbar region: Secondary | ICD-10-CM

## 2020-06-13 DIAGNOSIS — G8929 Other chronic pain: Secondary | ICD-10-CM

## 2020-06-13 MED ORDER — HYDROCODONE-ACETAMINOPHEN 10-325 MG PO TABS: 1.0000 | ORAL_TABLET | Freq: Three times a day (TID) | ORAL | 0 refills | Status: DC | PRN

## 2020-06-13 NOTE — Telephone Encounter (Signed)
Requesting: hydrocodone 10-325mg  Contract: 05/30/2019 UDS: 01/11/2020 Last Visit: 11/29/2019  Next Visit: None Last Refill: 05/15/2020 #60 and 0RF  [place] Pt.   Please Advise

## 2020-06-13 NOTE — Telephone Encounter (Signed)
PDMP okay, this prescription is refilled regularly by PCP.  Prescription sent

## 2020-07-12 ENCOUNTER — Encounter: Payer: Self-pay | Admitting: Family Medicine

## 2020-07-12 DIAGNOSIS — M4125 Other idiopathic scoliosis, thoracolumbar region: Secondary | ICD-10-CM

## 2020-07-12 DIAGNOSIS — G8929 Other chronic pain: Secondary | ICD-10-CM

## 2020-07-12 DIAGNOSIS — M545 Low back pain, unspecified: Secondary | ICD-10-CM

## 2020-07-13 MED ORDER — HYDROCODONE-ACETAMINOPHEN 10-325 MG PO TABS: 1.0000 | ORAL_TABLET | Freq: Three times a day (TID) | ORAL | 0 refills | Status: DC | PRN

## 2020-07-13 NOTE — Telephone Encounter (Signed)
Requesting: hydrocodone 10-325mg  Contract: 05/30/2019 UDS: 01/11/2020 Last Visit: 11/29/2019 Next Visit: None Last Refill: 06/13/2020 #60 and 0RF Pt sig: 1 tab q8h prn  Please Advise

## 2020-08-11 ENCOUNTER — Encounter: Payer: Self-pay | Admitting: Family Medicine

## 2020-08-11 DIAGNOSIS — M4125 Other idiopathic scoliosis, thoracolumbar region: Secondary | ICD-10-CM

## 2020-08-11 DIAGNOSIS — G8929 Other chronic pain: Secondary | ICD-10-CM

## 2020-08-11 DIAGNOSIS — M545 Low back pain, unspecified: Secondary | ICD-10-CM

## 2020-08-13 NOTE — Telephone Encounter (Signed)
Requesting: [facility]CO Contract: 01/11/2020 UDS: 01/11/2020 Last OV: 11/29/2019 Next OV: N/A Last Refill: 07/13/2020, #60--0 RF Database:   Please advise

## 2020-08-14 ENCOUNTER — Other Ambulatory Visit: Payer: Self-pay | Admitting: Family Medicine

## 2020-08-14 DIAGNOSIS — G8929 Other chronic pain: Secondary | ICD-10-CM

## 2020-08-14 DIAGNOSIS — M4125 Other idiopathic scoliosis, thoracolumbar region: Secondary | ICD-10-CM

## 2020-08-14 MED ORDER — HYDROCODONE-ACETAMINOPHEN 10-325 MG PO TABS
1.0000 | ORAL_TABLET | Freq: Three times a day (TID) | ORAL | 0 refills | Status: DC | PRN
Start: 2020-08-14 — End: 2020-09-12

## 2020-08-14 NOTE — Telephone Encounter (Signed)
Rx sent 

## 2020-08-29 DIAGNOSIS — R300 Dysuria: Secondary | ICD-10-CM | POA: Diagnosis not present

## 2020-08-29 DIAGNOSIS — R350 Frequency of micturition: Secondary | ICD-10-CM | POA: Diagnosis not present

## 2020-09-12 ENCOUNTER — Encounter: Payer: Self-pay | Admitting: Family Medicine

## 2020-09-12 DIAGNOSIS — M4125 Other idiopathic scoliosis, thoracolumbar region: Secondary | ICD-10-CM

## 2020-09-12 DIAGNOSIS — M545 Low back pain, unspecified: Secondary | ICD-10-CM

## 2020-09-12 NOTE — Telephone Encounter (Signed)
**  Please see Mychart message**  Requesting: [facility]CO Contract: 01/11/2020 UDS: 01/11/2020 Last OV: 11/29/2019 Next OV: N/A Last Refill:  08/14/2020, #60--0 RF  Database:   Please advise

## 2020-09-13 MED ORDER — HYDROCODONE-ACETAMINOPHEN 10-325 MG PO TABS: 1.0000 | ORAL_TABLET | Freq: Three times a day (TID) | ORAL | 0 refills | Status: DC | PRN

## 2020-10-09 ENCOUNTER — Other Ambulatory Visit: Payer: Self-pay

## 2020-10-09 ENCOUNTER — Encounter: Payer: Self-pay | Admitting: Family Medicine

## 2020-10-09 DIAGNOSIS — G8929 Other chronic pain: Secondary | ICD-10-CM

## 2020-10-09 DIAGNOSIS — M4125 Other idiopathic scoliosis, thoracolumbar region: Secondary | ICD-10-CM

## 2020-10-09 DIAGNOSIS — M545 Low back pain, unspecified: Secondary | ICD-10-CM

## 2020-10-09 MED ORDER — HYDROCODONE-ACETAMINOPHEN 10-325 MG PO TABS: 1.0000 | ORAL_TABLET | Freq: Three times a day (TID) | ORAL | 0 refills | Status: DC | PRN

## 2020-10-09 NOTE — Telephone Encounter (Signed)
Requesting: [facility]co Contract: 01-11-2020  UDS: 01-11-2020  Last Visit: 11/29/2019 Next Visit: 10/18/2020 Last Refill: 09/13/2020

## 2020-10-18 ENCOUNTER — Ambulatory Visit: Payer: BC Managed Care – PPO | Admitting: Family Medicine

## 2020-10-18 ENCOUNTER — Other Ambulatory Visit: Payer: Self-pay

## 2020-10-18 ENCOUNTER — Encounter: Payer: Self-pay | Admitting: Family Medicine

## 2020-10-18 VITALS — BP 108/80 | HR 94 | Temp 99.1°F | Resp 18 | Ht 60.0 in | Wt 140.2 lb

## 2020-10-18 DIAGNOSIS — M4125 Other idiopathic scoliosis, thoracolumbar region: Secondary | ICD-10-CM | POA: Diagnosis not present

## 2020-10-18 DIAGNOSIS — M545 Low back pain, unspecified: Secondary | ICD-10-CM | POA: Diagnosis not present

## 2020-10-18 DIAGNOSIS — Z79899 Other long term (current) drug therapy: Secondary | ICD-10-CM | POA: Diagnosis not present

## 2020-10-18 DIAGNOSIS — G8929 Other chronic pain: Secondary | ICD-10-CM

## 2020-10-18 DIAGNOSIS — G4700 Insomnia, unspecified: Secondary | ICD-10-CM

## 2020-10-18 MED ORDER — TRAZODONE HCL 50 MG PO TABS: 50.0000 mg | ORAL_TABLET | Freq: Every evening | ORAL | 1 refills | Status: DC | PRN

## 2020-10-18 NOTE — Patient Instructions (Signed)

## 2020-10-18 NOTE — Progress Notes (Signed)
Subjective:   By signing my name below, I, Amy Wiley, attest that this documentation has been prepared under the direction and in the presence of Dr. Seabron Spates, DO. 10/18/2020      Patient ID: Amy Wiley, female    DOB: 01-12-1971, 50 y.o.   MRN: [id number]  Chief Complaint  Patient presents with  . Pain  . Follow-up    HPI Patient is in today for a office visit. She is complaining about symptoms from her menopause but is seeing her OBGYN to manage these symptoms. Otherwise she is doing well at this time. She is requesting a refill on 50 mg trazodone daily PO. She is continuing to take 10-325 mg hydrocodone every 8 hours as needed to manage her scoliosis back pain. She denies having any fever, ear pain, congestion, sinus pain, sore throat, eye pain, chest pain, palpations, leg swelling, cough, shortness of breath, wheezing, nausea, vomiting, diarrhea, constipation, blood in stool, dysuria, frequency, dizziness at this time. She has recently started her new full time job.  Past Medical History:  Diagnosis Date  . Abnormal Pap smear   . ADD (attention deficit disorder)   . Anxiety     Past Surgical History:  Procedure Laterality Date  . WISDOM TOOTH EXTRACTION      Family History  Problem Relation Age of Onset  . Heart attack Paternal Grandmother   . Heart disease Paternal Grandmother   . ALS Paternal Grandmother   . Heart attack Paternal Uncle   . Heart disease Paternal Uncle 20       MI  . Hypertension Father   . Heart disease Paternal Grandfather     Social History   Socioeconomic History  . Marital status: Married    Spouse name: Not on file  . Number of children: Not on file  . Years of education: Not on file  . Highest education level: Not on file  Occupational History  . 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 Smoking status: Current Every Day Smoker    Types: Cigarettes  . Smokeless tobacco: Never Used  .  Tobacco comment: 1-2 cig a day  Substance and Sexual Activity  . Alcohol use: No  . Drug use: No  . Sexual activity: Yes    Partners: Male    Birth control/protection: I.U.D.    Comment: Mirena   Other Topics Concern  . Not on file  Social History Narrative   Exercise-- walks dog qd -- 2 miles   Social Determinants of Health   Financial Resource Strain: Not on file  Food Insecurity: Not on file  Transportation Needs: Not on file  Physical Activity: Not on file  Stress: Not on file  Social Connections: Not on file  Intimate Partner Violence: Not on file    Outpatient Medications Prior to Visit  Medication Sig Dispense Refill  . HYDROcodone-acetaminophen (NORCO) 10-325 MG tablet Take 1 tablet by mouth every 8 (eight) hours as needed. 60 tablet 0  . traZODone (DESYREL) 50 MG tablet Take 1 tablet (50 mg total) by mouth at bedtime as needed for sleep. 90 tablet 1  . levonorgestrel (MIRENA) 20 MCG/24HR IUD 1 each by Intrauterine route once.   (Patient not taking: Reported on 10/18/2020)     No facility-administered medications prior to visit.    Allergies  Allergen Reactions  . Varenicline Tartrate     REACTION: NV    Review of Systems  Constitutional: Negative for  fever.  HENT: Negative for congestion, ear pain, sinus pain and sore throat.   Eyes: Negative for pain.  Respiratory: Negative for cough, shortness of breath and wheezing.   Cardiovascular: Negative for chest pain, palpitations and leg swelling.  Gastrointestinal: Negative for blood in stool, constipation, diarrhea, nausea and vomiting.  Genitourinary: Negative for dysuria and frequency.  Neurological: Negative for dizziness.       Objective:    Physical Exam Constitutional:      Appearance: Normal appearance.  HENT:     Head: Normocephalic and atraumatic.     Right Ear: External ear normal.     Left Ear: External ear normal.  Cardiovascular:     Rate and Rhythm: Normal rate and regular rhythm.      Pulses: Normal pulses.     Heart sounds: Normal heart sounds.  Pulmonary:     Effort: Pulmonary effort is normal. No respiratory distress.     Breath sounds: Normal breath sounds. No wheezing, rhonchi or rales.  Abdominal:     General: Bowel sounds are normal.  Skin:    General: Skin is warm and dry.  Neurological:     Mental Status: She is alert and oriented to person, place, and time.  Psychiatric:        Behavior: Behavior normal.     BP 108/80 (BP Location: Right Arm, Patient Position: Sitting, Cuff Size: Normal)   Pulse 94   Temp 99.1 F (37.3 C) (Oral)   Resp 18   Ht 5' (1.524 m)   Wt 140 lb 3.2 oz (63.6 kg)   SpO2 95%   BMI 27.38 kg/m  Wt Readings from Last 3 Encounters:  10/18/20 140 lb 3.2 oz (63.6 kg)  05/30/19 126 lb (57.2 kg)  08/02/18 115 lb (52.2 kg)    Diabetic Foot Exam - Simple   No data filed    Lab Results  Component Value Date   WBC 8.2 02/08/2013   HGB 12.6 02/08/2013   HCT 37.0 02/08/2013   PLT 243.0 02/08/2013   GLUCOSE 85 02/08/2013   CHOL 198 02/08/2013   TRIG 100.0 02/08/2013   HDL 60.00 02/08/2013   LDLCALC 118 (H) 02/08/2013   ALT 23 02/08/2013   AST 17 02/08/2013   NA 136 02/08/2013   K 3.6 02/08/2013   CL 104 02/08/2013   CREATININE 0.7 02/08/2013   BUN 9 02/08/2013   CO2 29 02/08/2013   TSH 0.77 02/08/2013    Lab Results  Component Value Date   TSH 0.77 02/08/2013   Lab Results  Component Value Date   WBC 8.2 02/08/2013   HGB 12.6 02/08/2013   HCT 37.0 02/08/2013   MCV 88.9 02/08/2013   PLT 243.0 02/08/2013   Lab Results  Component Value Date   NA 136 02/08/2013   K 3.6 02/08/2013   CO2 29 02/08/2013   GLUCOSE 85 02/08/2013   BUN 9 02/08/2013   CREATININE 0.7 02/08/2013   BILITOT 0.4 02/08/2013   ALKPHOS 43 02/08/2013   AST 17 02/08/2013   ALT 23 02/08/2013   PROT 7.2 02/08/2013   ALBUMIN 4.2 02/08/2013   CALCIUM 8.9 02/08/2013   GFR 95.54 02/08/2013   Lab Results  Component Value Date   CHOL 198  02/08/2013   Lab Results  Component Value Date   HDL 60.00 02/08/2013   Lab Results  Component Value Date   LDLCALC 118 (H) 02/08/2013   Lab Results  Component Value Date   TRIG 100.0 02/08/2013  Lab Results  Component Value Date   CHOLHDL 3 02/08/2013   No results found for: HGBA1C     Assessment & Plan:   Problem List Items Addressed This Visit      Unprioritized   Chronic low back pain   Idiopathic scoliosis - Primary    uds , contract updated con't meds  Database reviewed         Other Visit Diagnoses    Insomnia, unspecified type       Relevant Medications   traZODone (DESYREL) 50 MG tablet   High risk medication use       Relevant Orders   DRUG MONITORING, PANEL 8 WITH CONFIRMATION, URINE       Meds ordered this encounter  Medications  . traZODone (DESYREL) 50 MG tablet    Sig: Take 1 tablet (50 mg total) by mouth at bedtime as needed for sleep.    Dispense:  90 tablet    Refill:  1    I, Donato Schultz, DO, personally preformed the services described in this documentation.  All medical record entries made by the scribe were at my direction and in my presence.  I have reviewed the chart and discharge instructions (if applicable) and agree that the record reflects my personal performance and is accurate and complete. 10/18/2020   I,Amy Wiley,acting as a scribe for Donato Schultz, DO.,have documented all relevant documentation on the behalf of Donato Schultz, DO,as directed by  Donato Schultz, DO while in the presence of Donato Schultz, DO.   Donato Schultz, DO

## 2020-10-18 NOTE — Assessment & Plan Note (Addendum)
uds , contract updated con't meds  Database reviewed

## 2020-10-21 LAB — DRUG MONITORING, PANEL 8 WITH CONFIRMATION, URINE
6 Acetylmorphine: NEGATIVE ng/mL (ref <10)
Alcohol Metabolites: NEGATIVE ng/mL
Alphahydroxyalprazolam: 151 ng/mL — ABNORMAL HIGH (ref <25)
Alphahydroxymidazolam: NEGATIVE ng/mL (ref <50)
Alphahydroxytriazolam: NEGATIVE ng/mL (ref <50)
Aminoclonazepam: NEGATIVE ng/mL (ref <25)
Amphetamines: NEGATIVE ng/mL (ref <500)
Benzodiazepines: POSITIVE ng/mL — AB (ref <100)
Benzoylecgonine: 1244 ng/mL — ABNORMAL HIGH (ref <100)
Buprenorphine, Urine: NEGATIVE ng/mL (ref <5)
Cocaine Metabolite: POSITIVE ng/mL — AB (ref <150)
Codeine: NEGATIVE ng/mL (ref <50)
Creatinine: 254.8 mg/dL
Hydrocodone: 3636 ng/mL — ABNORMAL HIGH (ref <50)
Hydromorphone: 2360 ng/mL — ABNORMAL HIGH (ref <50)
Hydroxyethylflurazepam: NEGATIVE ng/mL (ref <50)
Lorazepam: NEGATIVE ng/mL (ref <50)
MDMA: NEGATIVE ng/mL (ref <500)
Marijuana Metabolite: NEGATIVE ng/mL (ref <20)
Morphine: NEGATIVE ng/mL (ref <50)
Nordiazepam: NEGATIVE ng/mL (ref <50)
Norhydrocodone: >10000 ng/mL — ABNORMAL HIGH (ref <50)
Opiates: POSITIVE ng/mL — AB (ref <100)
Oxazepam: NEGATIVE ng/mL (ref <50)
Oxidant: NEGATIVE ug/mL
Oxycodone: NEGATIVE ng/mL (ref <100)
Temazepam: NEGATIVE ng/mL (ref <50)
pH: 6.1 (ref 4.5–9.0)

## 2020-10-21 LAB — DM TEMPLATE

## 2020-11-05 ENCOUNTER — Encounter: Payer: Self-pay | Admitting: Family Medicine

## 2020-11-05 DIAGNOSIS — G8929 Other chronic pain: Secondary | ICD-10-CM

## 2020-11-05 DIAGNOSIS — M4125 Other idiopathic scoliosis, thoracolumbar region: Secondary | ICD-10-CM

## 2020-11-05 DIAGNOSIS — M545 Low back pain, unspecified: Secondary | ICD-10-CM

## 2020-11-06 MED ORDER — HYDROCODONE-ACETAMINOPHEN 10-325 MG PO TABS: 1.0000 | ORAL_TABLET | Freq: Three times a day (TID) | ORAL | 0 refills | Status: DC | PRN

## 2020-11-06 NOTE — Telephone Encounter (Signed)
Requesting: hydrocodone 10-325mg  Contract:10/18/2020 UDS: 10/18/2020 Last Visit: 10/18/2020 Next Visit: None Last Refill: 10/09/2020 #60 and 0RF  Please Advise

## 2020-12-04 ENCOUNTER — Other Ambulatory Visit: Payer: Self-pay | Admitting: Family Medicine

## 2020-12-04 DIAGNOSIS — G8929 Other chronic pain: Secondary | ICD-10-CM

## 2020-12-04 DIAGNOSIS — M545 Low back pain, unspecified: Secondary | ICD-10-CM

## 2020-12-04 DIAGNOSIS — M4125 Other idiopathic scoliosis, thoracolumbar region: Secondary | ICD-10-CM

## 2020-12-04 NOTE — Telephone Encounter (Signed)
Patient is requesting a refill of the following medications: Requested Prescriptions   Pending Prescriptions Disp Refills   HYDROcodone-acetaminophen (NORCO) 10-325 MG tablet 60 tablet 0    Sig: Take 1 tablet by mouth every 8 (eight) hours as needed.    Date of patient request: 12/04/20 Last office visit: 10/18/20 Date of last refill: 11/06/20 Last refill amount: 60 + 0 Follow up time period per chart: Not scheduled yet.

## 2020-12-05 MED ORDER — HYDROCODONE-ACETAMINOPHEN 10-325 MG PO TABS: 1.0000 | ORAL_TABLET | Freq: Three times a day (TID) | ORAL | 0 refills | Status: DC | PRN

## 2021-01-02 ENCOUNTER — Other Ambulatory Visit: Payer: Self-pay | Admitting: Family Medicine

## 2021-01-02 DIAGNOSIS — G8929 Other chronic pain: Secondary | ICD-10-CM

## 2021-01-02 DIAGNOSIS — M545 Low back pain, unspecified: Secondary | ICD-10-CM

## 2021-01-02 DIAGNOSIS — M4125 Other idiopathic scoliosis, thoracolumbar region: Secondary | ICD-10-CM

## 2021-01-02 NOTE — Telephone Encounter (Signed)
Requesting: hydrocodone 10-325mg  Contract: 10/18/2020 UDS: 10/18/2020 Last Visit: 10/18/2020 Next Visit: None Last Refill: 12/05/2020 #60 and 0RF  Please Advise

## 2021-01-03 MED ORDER — HYDROCODONE-ACETAMINOPHEN 10-325 MG PO TABS: 1.0000 | ORAL_TABLET | Freq: Three times a day (TID) | ORAL | 0 refills | Status: DC | PRN

## 2021-01-29 ENCOUNTER — Encounter: Payer: Self-pay | Admitting: Family Medicine

## 2021-01-30 ENCOUNTER — Other Ambulatory Visit: Payer: Self-pay | Admitting: Family Medicine

## 2021-01-30 DIAGNOSIS — G8929 Other chronic pain: Secondary | ICD-10-CM

## 2021-01-30 DIAGNOSIS — M545 Low back pain, unspecified: Secondary | ICD-10-CM

## 2021-01-30 DIAGNOSIS — M4125 Other idiopathic scoliosis, thoracolumbar region: Secondary | ICD-10-CM

## 2021-01-30 MED ORDER — HYDROCODONE-ACETAMINOPHEN 10-325 MG PO TABS: 1.0000 | ORAL_TABLET | Freq: Three times a day (TID) | ORAL | 0 refills | Status: DC | PRN

## 2021-03-01 ENCOUNTER — Ambulatory Visit: Payer: BC Managed Care – PPO | Admitting: Family Medicine

## 2021-03-01 ENCOUNTER — Encounter: Payer: Self-pay | Admitting: Family Medicine

## 2021-03-01 ENCOUNTER — Other Ambulatory Visit: Payer: Self-pay

## 2021-03-01 VITALS — BP 144/88 | HR 74 | Temp 99.1°F | Resp 18 | Ht 60.0 in | Wt 149.2 lb

## 2021-03-01 DIAGNOSIS — M545 Low back pain, unspecified: Secondary | ICD-10-CM

## 2021-03-01 DIAGNOSIS — G8929 Other chronic pain: Secondary | ICD-10-CM

## 2021-03-01 DIAGNOSIS — M4125 Other idiopathic scoliosis, thoracolumbar region: Secondary | ICD-10-CM | POA: Diagnosis not present

## 2021-03-01 DIAGNOSIS — G4700 Insomnia, unspecified: Secondary | ICD-10-CM

## 2021-03-01 DIAGNOSIS — I10 Essential (primary) hypertension: Secondary | ICD-10-CM | POA: Insufficient documentation

## 2021-03-01 DIAGNOSIS — R030 Elevated blood-pressure reading, without diagnosis of hypertension: Secondary | ICD-10-CM

## 2021-03-01 MED ORDER — TRAZODONE HCL 50 MG PO TABS: 50.0000 mg | ORAL_TABLET | Freq: Every evening | ORAL | 1 refills | Status: DC | PRN

## 2021-03-01 MED ORDER — HYDROCODONE-ACETAMINOPHEN 10-325 MG PO TABS: 1.0000 | ORAL_TABLET | Freq: Three times a day (TID) | ORAL | 0 refills | Status: DC | PRN

## 2021-03-01 NOTE — Patient Instructions (Signed)

## 2021-03-01 NOTE — Progress Notes (Signed)
Subjective:   By signing my name below, I, Zite Okoli, attest that this documentation has been prepared under the direction and in the presence of Donato Schultz, DO. 03/01/2021    Patient ID: Amy Wiley, female    DOB: [birth year], 50 y.o.   MRN: [id number]  Chief Complaint  Patient presents with   Scoliosis   Follow-up    HPI Patient is in today for an office visit and a f/u on pain medication.  She is requesting a refill on 10-325 mg hydrocodone and 50 mg trazodone.   Her blood pressure is high at this visit. During the visit, it is now 170/100. She mentions she has gained about 25 pounds. BP Readings from Last 3 Encounters:  03/01/21 (!) 144/88  10/18/20 108/80  05/30/19 100/72    After the visit, it was checked again and measured 144/80.  Past Medical History:  Diagnosis Date   Abnormal Pap smear    ADD (attention deficit disorder)    Anxiety     Past Surgical History:  Procedure Laterality Date   WISDOM TOOTH EXTRACTION      Family History  Problem Relation Age of Onset   Heart attack Paternal Grandmother    Heart disease Paternal Grandmother    ALS Paternal Grandmother    Heart attack Paternal Uncle    Heart disease Paternal Uncle 68       MI   Hypertension Father    Heart disease Paternal Grandfather     Social History   Socioeconomic History   Marital status: Married    Spouse name: Not on file   Number of children: Not on file   Years of education: Not on file   Highest education level: Not on file  Occupational History   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    Comment: Mirena   Other Topics Concern   Not on file  Social History Narrative    Exercise-- walks dog qd -- 2 miles   Social Determinants of Health   Financial Resource Strain: Not on file  Food Insecurity: Not on file  Transportation Needs: Not on file  Physical Activity: Not on file  Stress: Not on file  Social Connections: Not on file  Intimate Partner Violence: Not on file    Outpatient Medications Prior to Visit  Medication Sig Dispense Refill   HYDROcodone-acetaminophen (NORCO) 10-325 MG tablet Take 1 tablet by mouth every 8 (eight) hours as needed. 60 tablet 0   traZODone (DESYREL) 50 MG tablet Take 1 tablet (50 mg total) by mouth at bedtime as needed for sleep. 90 tablet 1   No facility-administered medications prior to visit.    Allergies  Allergen Reactions   Varenicline Tartrate     REACTION: NV    Review of Systems  Constitutional:  Negative for fever.  HENT:  Negative for congestion, ear pain, hearing loss, sinus pain and sore throat.   Eyes:  Negative for blurred vision and pain.  Respiratory:  Negative for cough, sputum production, shortness of breath and wheezing.   Cardiovascular:  Negative for chest pain and palpitations.  Gastrointestinal:  Negative for blood in stool, constipation, diarrhea, nausea and vomiting.  Genitourinary:  Negative for dysuria, frequency, hematuria and urgency.  Musculoskeletal:  Negative for back pain, falls and myalgias.  Neurological:  Negative for dizziness, sensory change, loss of consciousness, weakness and headaches.  Endo/Heme/Allergies:  Negative for environmental allergies. Does not bruise/bleed easily.  Psychiatric/Behavioral:  Negative for depression and suicidal ideas. The patient is not nervous/anxious and does not have insomnia.       Objective:    Physical Exam Constitutional:      General: She is not in acute distress.    Appearance: Normal appearance. She is not ill-appearing.  HENT:     Head: Normocephalic and atraumatic.     Right Ear: External ear normal.     Left Ear: External ear  normal.  Eyes:     Extraocular Movements: Extraocular movements intact.     Pupils: Pupils are equal, round, and reactive to light.  Cardiovascular:     Rate and Rhythm: Normal rate and regular rhythm.     Pulses: Normal pulses.     Heart sounds: Normal heart sounds. No murmur heard.   No gallop.  Pulmonary:     Effort: Pulmonary effort is normal. No respiratory distress.     Breath sounds: Normal breath sounds. No wheezing, rhonchi or rales.  Abdominal:     General: Bowel sounds are normal. There is no distension.     Palpations: Abdomen is soft. There is no mass.     Tenderness: There is no abdominal tenderness. There is no guarding or rebound.     Hernia: No hernia is present.  Musculoskeletal:     Cervical back: Normal range of motion and neck supple.  Lymphadenopathy:     Cervical: No cervical adenopathy.  Skin:    General: Skin is warm and dry.  Neurological:     Mental Status: She is alert and oriented to person, place, and time.  Psychiatric:        Behavior: Behavior normal.    BP (!) 144/88 (BP Location: Left Arm, Patient Position: Sitting, Cuff Size: Normal)   Pulse 74   Temp 99.1 F (37.3 C) (Oral)   Resp 18   Ht 5' (1.524 m)   Wt 149 lb 3.2 oz (67.7 kg)   SpO2 97%   BMI 29.14 kg/m  Wt Readings from Last 3 Encounters:  03/01/21 149 lb 3.2 oz (67.7 kg)  10/18/20 140 lb 3.2 oz (63.6 kg)  05/30/19 126 lb (57.2 kg)    Diabetic Foot Exam - Simple   No data filed    Lab Results  Component Value Date   WBC 8.2 02/08/2013   HGB 12.6 02/08/2013   HCT 37.0 02/08/2013   PLT 243.0 02/08/2013   GLUCOSE 85 02/08/2013   CHOL 198 02/08/2013   TRIG 100.0 02/08/2013   HDL 60.00 02/08/2013   LDLCALC 118 (H) 02/08/2013   ALT 23 02/08/2013   AST 17 02/08/2013   NA 136 02/08/2013   K 3.6 02/08/2013   CL 104 02/08/2013   CREATININE 0.7 02/08/2013   BUN 9 02/08/2013   CO2 29 02/08/2013   TSH 0.77 02/08/2013    Lab Results  Component Value Date   TSH 0.77  02/08/2013   Lab Results  Component Value Date   WBC 8.2 02/08/2013   HGB 12.6 02/08/2013   HCT 37.0 02/08/2013   MCV 88.9 02/08/2013   PLT 243.0 02/08/2013   Lab Results  Component Value  Date   NA 136 02/08/2013   K 3.6 02/08/2013   CO2 29 02/08/2013   GLUCOSE 85 02/08/2013   BUN 9 02/08/2013   CREATININE 0.7 02/08/2013   BILITOT 0.4 02/08/2013   ALKPHOS 43 02/08/2013   AST 17 02/08/2013   ALT 23 02/08/2013   PROT 7.2 02/08/2013   ALBUMIN 4.2 02/08/2013   CALCIUM 8.9 02/08/2013   GFR 95.54 02/08/2013   Lab Results  Component Value Date   CHOL 198 02/08/2013   Lab Results  Component Value Date   HDL 60.00 02/08/2013   Lab Results  Component Value Date   LDLCALC 118 (H) 02/08/2013   Lab Results  Component Value Date   TRIG 100.0 02/08/2013   Lab Results  Component Value Date   CHOLHDL 3 02/08/2013   No results found for: HGBA1C     Assessment & Plan:   Problem List Items Addressed This Visit       Unprioritized   Chronic low back pain   Relevant Medications   HYDROcodone-acetaminophen (NORCO) 10-325 MG tablet   Elevated BP without diagnosis of hypertension - Primary    Dash diet Recheck bp 2-3 weeks       Idiopathic scoliosis    Uds/ contract utd Database reviewed Refill meds       Relevant Medications   HYDROcodone-acetaminophen (NORCO) 10-325 MG tablet   Other Visit Diagnoses     Insomnia, unspecified type       Relevant Medications   traZODone (DESYREL) 50 MG tablet         Meds ordered this encounter  Medications   HYDROcodone-acetaminophen (NORCO) 10-325 MG tablet    Sig: Take 1 tablet by mouth every 8 (eight) hours as needed.    Dispense:  60 tablet    Refill:  0   traZODone (DESYREL) 50 MG tablet    Sig: Take 1 tablet (50 mg total) by mouth at bedtime as needed for sleep.    Dispense:  90 tablet    Refill:  1    I,Zite Okoli,acting as a Neurosurgeon for [employer], DO.,have documented all relevant documentation  on the behalf of Donato Schultz, DO,as directed by  Donato Schultz, DO while in the presence of Donato Schultz, DO.   I, Donato Schultz, DO. , personally preformed the services described in this documentation.  All medical record entries made by the scribe were at my direction and in my presence.  I have reviewed the chart and discharge instructions (if applicable) and agree that the record reflects my personal performance and is accurate and complete. 03/01/2021

## 2021-03-01 NOTE — Assessment & Plan Note (Signed)
Uds/ contract utd Database reviewed Refill meds

## 2021-03-01 NOTE — Assessment & Plan Note (Signed)
Dash diet Recheck bp 2-3 weeks

## 2021-03-28 ENCOUNTER — Encounter: Payer: Self-pay | Admitting: Family Medicine

## 2021-03-28 DIAGNOSIS — M4125 Other idiopathic scoliosis, thoracolumbar region: Secondary | ICD-10-CM

## 2021-03-28 DIAGNOSIS — G8929 Other chronic pain: Secondary | ICD-10-CM

## 2021-03-28 DIAGNOSIS — M545 Low back pain, unspecified: Secondary | ICD-10-CM

## 2021-03-28 MED ORDER — HYDROCODONE-ACETAMINOPHEN 10-325 MG PO TABS: 1.0000 | ORAL_TABLET | Freq: Three times a day (TID) | ORAL | 0 refills | Status: DC | PRN

## 2021-03-28 NOTE — Telephone Encounter (Signed)
Requesting: hydrocodone 10-325mg   Contract: 10/18/2020 UDS: 10/18/2020 Last Visit: 03/01/2021 Next Visit: None Last Refill: 03/01/2021 #60 and 0RF  Please Advise

## 2021-04-29 ENCOUNTER — Encounter: Payer: Self-pay | Admitting: Family Medicine

## 2021-04-29 DIAGNOSIS — G8929 Other chronic pain: Secondary | ICD-10-CM

## 2021-04-29 DIAGNOSIS — M4125 Other idiopathic scoliosis, thoracolumbar region: Secondary | ICD-10-CM

## 2021-04-29 DIAGNOSIS — M545 Low back pain, unspecified: Secondary | ICD-10-CM

## 2021-04-29 MED ORDER — HYDROCODONE-ACETAMINOPHEN 10-325 MG PO TABS: 1.0000 | ORAL_TABLET | Freq: Three times a day (TID) | ORAL | 0 refills | Status: DC | PRN

## 2021-04-29 NOTE — Telephone Encounter (Signed)
Requesting: NORCO Contract: 10/18/2020 UDS: 10/18/20 Last OV: 03/01/2021 Next OV: N/A Last Refill: 03/28/21, #60--0 RF Database:   Please advise

## 2021-05-29 ENCOUNTER — Encounter: Payer: Self-pay | Admitting: Family Medicine

## 2021-05-29 DIAGNOSIS — M4125 Other idiopathic scoliosis, thoracolumbar region: Secondary | ICD-10-CM

## 2021-05-29 DIAGNOSIS — M545 Low back pain, unspecified: Secondary | ICD-10-CM

## 2021-05-30 ENCOUNTER — Other Ambulatory Visit: Payer: Self-pay | Admitting: Family Medicine

## 2021-05-30 DIAGNOSIS — M545 Low back pain, unspecified: Secondary | ICD-10-CM

## 2021-05-30 DIAGNOSIS — M4125 Other idiopathic scoliosis, thoracolumbar region: Secondary | ICD-10-CM

## 2021-05-30 MED ORDER — HYDROCODONE-ACETAMINOPHEN 10-325 MG PO TABS: 1.0000 | ORAL_TABLET | Freq: Three times a day (TID) | ORAL | 0 refills | Status: DC | PRN

## 2021-05-30 NOTE — Telephone Encounter (Signed)
Requesting:hydrocodone 10-325mg  Contract: 10/18/2020 UDS: 10/18/2020 Last Visit: 03/01/2021 Next Visit: None Last Refill: 04/29/2021 #60 and 0RF  Please Advise

## 2021-06-30 ENCOUNTER — Encounter: Payer: Self-pay | Admitting: Family Medicine

## 2021-06-30 DIAGNOSIS — M4125 Other idiopathic scoliosis, thoracolumbar region: Secondary | ICD-10-CM

## 2021-06-30 DIAGNOSIS — G8929 Other chronic pain: Secondary | ICD-10-CM

## 2021-07-01 MED ORDER — HYDROCODONE-ACETAMINOPHEN 10-325 MG PO TABS: 1.0000 | ORAL_TABLET | Freq: Three times a day (TID) | ORAL | 0 refills | Status: DC | PRN

## 2021-07-01 NOTE — Telephone Encounter (Signed)
Requesting: NORCO Contract: 10/18/2020 UDS: 10/18/2020 Last OV: 03/01/2021 Next OV: N/A Last Refill: 05/30/2021, #60--0 RF Database:   Please advise

## 2021-07-11 DIAGNOSIS — Z1231 Encounter for screening mammogram for malignant neoplasm of breast: Secondary | ICD-10-CM | POA: Diagnosis not present

## 2021-07-11 DIAGNOSIS — Z01419 Encounter for gynecological examination (general) (routine) without abnormal findings: Secondary | ICD-10-CM | POA: Diagnosis not present

## 2021-07-11 DIAGNOSIS — L659 Nonscarring hair loss, unspecified: Secondary | ICD-10-CM | POA: Diagnosis not present

## 2021-07-12 ENCOUNTER — Encounter: Payer: Self-pay | Admitting: Family Medicine

## 2021-07-15 ENCOUNTER — Ambulatory Visit: Payer: BC Managed Care – PPO | Admitting: Family Medicine

## 2021-07-29 ENCOUNTER — Ambulatory Visit: Payer: BC Managed Care – PPO | Admitting: Family Medicine

## 2021-07-30 ENCOUNTER — Ambulatory Visit: Payer: BC Managed Care – PPO | Admitting: Family Medicine

## 2021-07-30 VITALS — BP 112/82 | HR 82 | Temp 99.1°F | Resp 16 | Ht 60.0 in | Wt 151.4 lb

## 2021-07-30 DIAGNOSIS — G4700 Insomnia, unspecified: Secondary | ICD-10-CM | POA: Diagnosis not present

## 2021-07-30 DIAGNOSIS — R7989 Other specified abnormal findings of blood chemistry: Secondary | ICD-10-CM

## 2021-07-30 DIAGNOSIS — Z79899 Other long term (current) drug therapy: Secondary | ICD-10-CM | POA: Diagnosis not present

## 2021-07-30 DIAGNOSIS — G8929 Other chronic pain: Secondary | ICD-10-CM

## 2021-07-30 DIAGNOSIS — M4125 Other idiopathic scoliosis, thoracolumbar region: Secondary | ICD-10-CM

## 2021-07-30 DIAGNOSIS — M545 Low back pain, unspecified: Secondary | ICD-10-CM | POA: Diagnosis not present

## 2021-07-30 DIAGNOSIS — Z1211 Encounter for screening for malignant neoplasm of colon: Secondary | ICD-10-CM

## 2021-07-30 MED ORDER — HYDROCODONE-ACETAMINOPHEN 10-325 MG PO TABS: 1.0000 | ORAL_TABLET | Freq: Three times a day (TID) | ORAL | 0 refills | Status: DC | PRN

## 2021-07-30 MED ORDER — TRAZODONE HCL 50 MG PO TABS: 50.0000 mg | ORAL_TABLET | Freq: Every evening | ORAL | 1 refills | Status: DC | PRN

## 2021-07-30 NOTE — Progress Notes (Addendum)
Subjective:   By signing my name below, I, [name], attest that this documentation has been prepared under the direction and in the presence of Ann Held, DO  07/30/2021    Patient ID: Amy Wiley, female    DOB: [birth year], 51 y.o.   MRN: [id number]  Chief Complaint  Patient presents with   Thyroid Concern    Pt here to discuss labs from OBGYN.     HPI Patient is in today for a office visit. She is reviewing her lab work during this visit. She reports last completing these labs a couple of weeks ago. These labs were ordered from her GYN specialist due to her complaining her hair is starting to thin. Her vitamin D levels and TSH levels were low. She reports gaining 35 lb's in the past 2 years with no reason, otherwise she has no other symptoms of low thyroid levels. She denies having issues swallowing.  She is not interested in receiving a flu vaccine this year. She has 2 Covid-19 vaccines at this time. She is not interested in receiving the shingles vaccine.  She has never completed a colonoscopy. She continues regularly completing her mammogram and pap smears.    Past Medical History:  Diagnosis Date   Abnormal Pap smear    ADD (attention deficit disorder)    Anxiety     Past Surgical History:  Procedure Laterality Date   WISDOM TOOTH EXTRACTION      Family History  Problem Relation Age of Onset   Heart attack Paternal Grandmother    Heart disease Paternal Grandmother    ALS Paternal Grandmother    Heart attack Paternal Uncle    Heart disease Paternal Uncle 77       MI   Hypertension Father    Heart disease Paternal Grandfather     Social History   Socioeconomic History   Marital status: Married    Spouse name: Not on file   Number of children: Not on file   Years of education: Not on file   Highest education level: Not on file  Occupational History   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    Comment: Mirena   Other Topics Concern   Not on file  Social History Narrative   Exercise-- walks dog qd -- 2 miles   Social Determinants of Health   Financial Resource Strain: Not on file  Food Insecurity: Not on file  Transportation Needs: Not on file  Physical Activity: Not on file  Stress: Not on file  Social Connections: Not on file  Intimate Partner Violence: Not on file    Outpatient Medications Prior to Visit  Medication Sig Dispense Refill   HYDROcodone-acetaminophen (NORCO) 10-325 MG tablet Take 1 tablet by mouth every 8 (eight) hours as needed. 60 tablet 0   traZODone (DESYREL) 50 MG tablet Take 1 tablet (50 mg total) by mouth at bedtime as needed for sleep. 90 tablet 1   No facility-administered medications prior to visit.    Allergies  Allergen Reactions   Varenicline Tartrate     REACTION: NV  Review of Systems  Constitutional:  Negative for fever and malaise/fatigue.  HENT:  Negative for congestion.   Eyes:  Negative for blurred vision.  Respiratory:  Negative for shortness of breath.   Cardiovascular:  Negative for chest pain, palpitations and leg swelling.  Gastrointestinal:  Negative for abdominal pain, blood in stool and nausea.  Genitourinary:  Negative for dysuria and frequency.  Musculoskeletal:  Negative for falls.  Skin:  Negative for rash.  Neurological:  Negative for dizziness, loss of consciousness and headaches.  Endo/Heme/Allergies:  Negative for environmental allergies.  Psychiatric/Behavioral:  Negative for depression. The patient is not nervous/anxious.       Objective:    Physical Exam Vitals and nursing note reviewed.  Constitutional:      Appearance: She is well-developed.  HENT:      Head: Normocephalic and atraumatic.     Comments: Thinning hair  Eyes:     Conjunctiva/sclera: Conjunctivae normal.  Neck:     Thyroid: No thyromegaly.     Vascular: No carotid bruit or JVD.  Cardiovascular:     Rate and Rhythm: Normal rate and regular rhythm.     Heart sounds: Normal heart sounds. No murmur heard. Pulmonary:     Effort: Pulmonary effort is normal. No respiratory distress.     Breath sounds: Normal breath sounds. No wheezing or rales.  Chest:     Chest wall: No tenderness.  Musculoskeletal:     Cervical back: Normal range of motion and neck supple.  Neurological:     Mental Status: She is alert and oriented to person, place, and time.    BP 112/82 (BP Location: Left Arm, Patient Position: Sitting, Cuff Size: Normal)    Pulse 82    Temp 99.1 F (37.3 C) (Oral)    Resp 16    Ht 5' (1.524 m)    Wt 151 lb 6.4 oz (68.7 kg)    SpO2 99%    BMI 29.57 kg/m  Wt Readings from Last 3 Encounters:  07/30/21 151 lb 6.4 oz (68.7 kg)  03/01/21 149 lb 3.2 oz (67.7 kg)  10/18/20 140 lb 3.2 oz (63.6 kg)    Diabetic Foot Exam - Simple   No data filed    Lab Results  Component Value Date   WBC 8.2 02/08/2013   HGB 12.6 02/08/2013   HCT 37.0 02/08/2013   PLT 243.0 02/08/2013   GLUCOSE 85 02/08/2013   CHOL 198 02/08/2013   TRIG 100.0 02/08/2013   HDL 60.00 02/08/2013   LDLCALC 118 (H) 02/08/2013   ALT 23 02/08/2013   AST 17 02/08/2013   NA 136 02/08/2013   K 3.6 02/08/2013   CL 104 02/08/2013   CREATININE 0.7 02/08/2013   BUN 9 02/08/2013   CO2 29 02/08/2013   TSH 1.19 07/30/2021    Lab Results  Component Value Date   TSH 1.19 07/30/2021   Lab Results  Component Value Date   WBC 8.2 02/08/2013   HGB 12.6 02/08/2013   HCT 37.0 02/08/2013   MCV 88.9 02/08/2013   PLT 243.0 02/08/2013   Lab Results  Component Value Date   NA 136 02/08/2013   K 3.6 02/08/2013   CO2 29 02/08/2013   GLUCOSE 85 02/08/2013   BUN 9 02/08/2013   CREATININE 0.7 02/08/2013    BILITOT 0.4 02/08/2013   ALKPHOS 43 02/08/2013   AST 17 02/08/2013   ALT 23 02/08/2013   PROT 7.2 02/08/2013   ALBUMIN 4.2 02/08/2013   CALCIUM  8.9 02/08/2013   GFR 95.54 02/08/2013   Lab Results  Component Value Date   CHOL 198 02/08/2013   Lab Results  Component Value Date   HDL 60.00 02/08/2013   Lab Results  Component Value Date   LDLCALC 118 (H) 02/08/2013   Lab Results  Component Value Date   TRIG 100.0 02/08/2013   Lab Results  Component Value Date   CHOLHDL 3 02/08/2013   No results found for: HGBA1C     Assessment & Plan:   Problem List Items Addressed This Visit       Unprioritized   Chronic low back pain   Relevant Medications   HYDROcodone-acetaminophen (NORCO) 10-325 MG tablet   traZODone (DESYREL) 50 MG tablet   Abnormal thyroid blood test - Primary    Recheck today Consider derm if thyroid normal      Relevant Orders   Thyroid Panel With TSH (Completed)   Thyroid peroxidase antibody (Completed)   Idiopathic scoliosis    Stable  con't pain med uds / contract utd      Relevant Medications   HYDROcodone-acetaminophen (NORCO) 10-325 MG tablet   Other Visit Diagnoses     Insomnia, unspecified type       Relevant Medications   traZODone (DESYREL) 50 MG tablet   High risk medication use       Relevant Orders   Drug Monitoring Panel [phone] , Urine   Colon cancer screening       Relevant Orders   Ambulatory referral to Gastroenterology        Meds ordered this encounter  Medications   HYDROcodone-acetaminophen (NORCO) 10-325 MG tablet    Sig: Take 1 tablet by mouth every 8 (eight) hours as needed.    Dispense:  60 tablet    Refill:  0   traZODone (DESYREL) 50 MG tablet    Sig: Take 1 tablet (50 mg total) by mouth at bedtime as needed for sleep.    Dispense:  90 tablet    Refill:  1    I, Lowne Chase, [employer], DO, personally preformed the services described in this documentation.  All medical record entries made by the  scribe were at my direction and in my presence.  I have reviewed the chart and discharge instructions (if applicable) and agree that the record reflects my personal performance and is accurate and complete. 07/30/2021   I,[name],acting as a scribe for Ann Held, DO.,have documented all relevant documentation on the behalf of Ann Held, DO,as directed by  Ann Held, DO while in the presence of Ann Held, DO.   Ann Held, DO

## 2021-07-30 NOTE — Patient Instructions (Signed)
Thyroid-Stimulating Hormone Test Why am I having this test? The thyroid is a gland in the lower front of the neck. It makes hormones that affect many body parts and systems, including the system that affects how quickly the body burns fuel for energy (metabolism). The pituitary gland is located just below the brain, behind the eyes and nasal passages. It helps maintain thyroid hormone levels and thyroid gland function. You may have a thyroid-stimulating hormone (TSH) test if you have possible symptoms of abnormal thyroid hormone levels. This test can help your health care provider: Diagnose a disorder of the thyroid gland or pituitary gland. Manage your condition and treatment if you have an underactive thyroid (hypothyroidism) or an overactive thyroid (hyperthyroidism). Newborn babies may have this test done to screen for hypothyroidism that is present at birth (congenital). What is being tested? This test measures the amount of TSH in your blood. TSH may also be called thyrotropin. When the thyroid does not make enough hormones, the pituitary gland releases TSH into the bloodstream to stimulate the thyroid gland to make more hormones. What kind of sample is taken?   A blood sample is required for this test. It is usually collected by inserting a needle into a blood vessel. For newborns, a small amount of blood may be collected from the umbilical cord, or by using a small needle to prick the baby's heel (heel stick). Tell a health care provider about: All medicines you are taking, including vitamins, herbs, eye drops, creams, and over-the-counter medicines. Any blood disorders you have. Any surgeries you have had. Any medical conditions you have. Whether you are pregnant or may be pregnant. How are the results reported? Your test results will be reported as a value that indicates how much TSH is in your blood. Your health care provider will compare your results to normal ranges that were  established after testing a large group of people (reference ranges). Reference ranges may vary among labs and hospitals. For this test, common reference ranges are: Adult: 2-10 microunits/mL or 2-10 milliunits/L. Newborn: Heel stick: 3-18 microunits/mL or 3-18 milliunits/L. Umbilical cord: 3-12 microunits/mL or 3-12 milliunits/L. What do the results mean? Results that are within the reference range are considered normal. This means that you have a normal amount of TSH in your blood. Results that are higher than the reference range mean that your TSH levels are too high. This may mean: Your thyroid gland is not making enough thyroid hormones. Your thyroid medicine dosage is too low. You have a tumor on your pituitary gland. This is rare. Results that are lower than the reference range mean that your TSH levels are too low. This may be caused by hyperthyroidism or by a problem with the pituitary gland function. Talk with your health care provider about what your results mean. Questions to ask your health care provider Ask your health care provider, or the department that is doing the test: When will my results be ready? How will I get my results? What are my treatment options? What other tests do I need? What are my next steps? Summary You may have a thyroid-stimulating hormone (TSH) test if you have possible symptoms of abnormal thyroid hormone levels. The thyroid is a gland in the lower front of the neck. It makes hormones that affect many body parts and systems. The pituitary gland is located just below the brain, behind the eyes and nasal passages. It helps maintain thyroid hormone levels and thyroid gland function. This test measures the   amount of TSH in your blood. TSH is made by the pituitary gland. It may also be called thyrotropin. This information is not intended to replace advice given to you by your health care provider. Make sure you discuss any questions you have with your  health care provider. Document Revised: 02/07/2021 Document Reviewed: 02/16/2020 Elsevier Patient Education  2022 Elsevier Inc.  

## 2021-07-31 ENCOUNTER — Encounter: Payer: Self-pay | Admitting: Family Medicine

## 2021-07-31 DIAGNOSIS — R7989 Other specified abnormal findings of blood chemistry: Secondary | ICD-10-CM | POA: Insufficient documentation

## 2021-07-31 LAB — THYROID PANEL WITH TSH
Free Thyroxine Index: 2.6 (ref 1.4–3.8)
T3 Uptake: 30 % (ref 22–35)
T4, Total: 8.7 ug/dL (ref 5.1–11.9)
TSH: 1.19 mIU/L

## 2021-07-31 LAB — THYROID PEROXIDASE ANTIBODY: Thyroperoxidase Ab SerPl-aCnc: 1 IU/mL (ref <9)

## 2021-07-31 NOTE — Assessment & Plan Note (Signed)
Recheck today Consider derm if thyroid normal

## 2021-07-31 NOTE — Assessment & Plan Note (Signed)
Stable  con't pain med uds / contract utd

## 2021-08-01 LAB — DRUG MONITORING PANEL 376104, URINE
Amphetamines: NEGATIVE ng/mL (ref <500)
Barbiturates: NEGATIVE ng/mL (ref <300)
Benzodiazepines: NEGATIVE ng/mL (ref <100)
Cocaine Metabolite: NEGATIVE ng/mL (ref <150)
Desmethyltramadol: NEGATIVE ng/mL (ref <100)
Opiates: NEGATIVE ng/mL (ref <100)
Oxycodone: NEGATIVE ng/mL (ref <100)
Tramadol: NEGATIVE ng/mL (ref <100)

## 2021-08-01 LAB — DM TEMPLATE

## 2021-08-26 ENCOUNTER — Telehealth: Payer: Self-pay

## 2021-08-26 ENCOUNTER — Encounter: Payer: Self-pay | Admitting: Family Medicine

## 2021-08-26 DIAGNOSIS — M4125 Other idiopathic scoliosis, thoracolumbar region: Secondary | ICD-10-CM

## 2021-08-26 DIAGNOSIS — G8929 Other chronic pain: Secondary | ICD-10-CM

## 2021-08-26 MED ORDER — HYDROCODONE-ACETAMINOPHEN 10-325 MG PO TABS: 1.0000 | ORAL_TABLET | Freq: Three times a day (TID) | ORAL | 0 refills | Status: DC | PRN

## 2021-08-26 NOTE — Telephone Encounter (Signed)
PA approved. Effective from 08/26/2021 through 09/24/2021. ?

## 2021-08-26 NOTE — Telephone Encounter (Signed)
Requesting: NORCO ?Contract: 07/30/2021 ?UDS: 07/30/2021 ?Last OV: 07/30/2021 ?Next OV: N/A ?Last Refill: 07/30/2021, #60--0 RF ?Database: ? ? ?Please advise  ? ?

## 2021-08-26 NOTE — Telephone Encounter (Signed)
PA initiated via Covermymeds; KEY: [id number]. Awaiting determination.  ?

## 2021-09-24 ENCOUNTER — Encounter: Payer: Self-pay | Admitting: Family Medicine

## 2021-09-25 ENCOUNTER — Other Ambulatory Visit: Payer: Self-pay | Admitting: Family Medicine

## 2021-09-25 DIAGNOSIS — G8929 Other chronic pain: Secondary | ICD-10-CM

## 2021-09-25 DIAGNOSIS — M4125 Other idiopathic scoliosis, thoracolumbar region: Secondary | ICD-10-CM

## 2021-09-25 MED ORDER — HYDROCODONE-ACETAMINOPHEN 10-325 MG PO TABS: 1.0000 | ORAL_TABLET | Freq: Three times a day (TID) | ORAL | 0 refills | Status: DC | PRN

## 2021-10-22 ENCOUNTER — Encounter: Payer: Self-pay | Admitting: Family Medicine

## 2021-10-22 DIAGNOSIS — M545 Low back pain, unspecified: Secondary | ICD-10-CM

## 2021-10-22 DIAGNOSIS — M4125 Other idiopathic scoliosis, thoracolumbar region: Secondary | ICD-10-CM

## 2021-10-22 MED ORDER — HYDROCODONE-ACETAMINOPHEN 10-325 MG PO TABS: 1.0000 | ORAL_TABLET | Freq: Three times a day (TID) | ORAL | 0 refills | Status: DC | PRN

## 2021-10-22 NOTE — Telephone Encounter (Signed)
Requesting: NORCO ?Contract: 07/30/2021 ?UDS: 07/30/2021 ?Last OV: 07/30/2021 ?Next OV: N/A ?Last Refill: 09/25/2021, #60--0 RF ?Database: ? ? ?Please advise  ? ?

## 2021-10-25 ENCOUNTER — Encounter: Payer: Self-pay | Admitting: Family Medicine

## 2021-11-25 ENCOUNTER — Encounter: Payer: Self-pay | Admitting: Family Medicine

## 2021-11-25 ENCOUNTER — Other Ambulatory Visit: Payer: Self-pay | Admitting: Family Medicine

## 2021-11-25 DIAGNOSIS — G8929 Other chronic pain: Secondary | ICD-10-CM

## 2021-11-25 DIAGNOSIS — M4125 Other idiopathic scoliosis, thoracolumbar region: Secondary | ICD-10-CM

## 2021-11-25 MED ORDER — HYDROCODONE-ACETAMINOPHEN 10-325 MG PO TABS: 1.0000 | ORAL_TABLET | Freq: Three times a day (TID) | ORAL | 0 refills | Status: DC | PRN

## 2021-11-25 NOTE — Telephone Encounter (Signed)
Requesting: hydrocodone 10-325mg   Contract: 07/30/21 UDS: 07/30/21 Last Visit: 07/30/21 Next Visit: None Last Refill: 10/22/21 #60 and 0RF  Please Advise

## 2021-11-27 ENCOUNTER — Telehealth: Payer: Self-pay

## 2021-11-27 NOTE — Telephone Encounter (Signed)
PA initiated via Covermymeds; KEY: [id number]. Awaiting determination.

## 2021-11-27 NOTE — Telephone Encounter (Signed)
PA approved.   Effective from 11/27/2021 through 12/26/2021.

## 2021-12-23 ENCOUNTER — Encounter: Payer: Self-pay | Admitting: Family Medicine

## 2021-12-23 DIAGNOSIS — G8929 Other chronic pain: Secondary | ICD-10-CM

## 2021-12-23 DIAGNOSIS — M4125 Other idiopathic scoliosis, thoracolumbar region: Secondary | ICD-10-CM

## 2021-12-23 MED ORDER — HYDROCODONE-ACETAMINOPHEN 10-325 MG PO TABS: 1.0000 | ORAL_TABLET | Freq: Three times a day (TID) | ORAL | 0 refills | Status: DC | PRN

## 2021-12-23 NOTE — Telephone Encounter (Signed)
Requesting: hydrocodone 10-325mg   Contract: 07/30/21 UDS: 07/30/21 Last Visit: 11/1621 Next Visit: None

## 2021-12-27 ENCOUNTER — Ambulatory Visit (INDEPENDENT_AMBULATORY_CARE_PROVIDER_SITE_OTHER): Payer: BC Managed Care – PPO

## 2021-12-27 ENCOUNTER — Ambulatory Visit: Payer: BC Managed Care – PPO | Admitting: Podiatry

## 2021-12-27 DIAGNOSIS — M7662 Achilles tendinitis, left leg: Secondary | ICD-10-CM | POA: Diagnosis not present

## 2021-12-27 DIAGNOSIS — M7989 Other specified soft tissue disorders: Secondary | ICD-10-CM

## 2021-12-27 DIAGNOSIS — M722 Plantar fascial fibromatosis: Secondary | ICD-10-CM

## 2021-12-27 NOTE — Patient Instructions (Addendum)
If was nice to meet you today. If you have any questions or any further concerns, please feel fee to give me a call. You can call our office at [phone] or please feel fee to send me a message through MyChart.    Look at getting a "night splint"  You can also use Voltaren gel and rub it in the area as needed.  Plantar Fasciitis (Heel Spur Syndrome) with Rehab The plantar fascia is a fibrous, ligament-like, soft-tissue structure that spans the bottom of the foot. Plantar fasciitis is a condition that causes pain in the foot due to inflammation of the tissue. SYMPTOMS  Pain and tenderness on the underneath side of the foot. Pain that worsens with standing or walking. CAUSES  Plantar fasciitis is caused by irritation and injury to the plantar fascia on the underneath side of the foot. Common mechanisms of injury include: Direct trauma to bottom of the foot. Damage to a small nerve that runs under the foot where the main fascia attaches to the heel bone. Stress placed on the plantar fascia due to bone spurs. RISK INCREASES WITH:  Activities that place stress on the plantar fascia (running, jumping, pivoting, or cutting). Poor strength and flexibility. Improperly fitted shoes. Tight calf muscles. Flat feet. Failure to warm-up properly before activity. Obesity. PREVENTION Warm up and stretch properly before activity. Allow for adequate recovery between workouts. Maintain physical fitness: Strength, flexibility, and endurance. Cardiovascular fitness. Maintain a health body weight. Avoid stress on the plantar fascia. Wear properly fitted shoes, including arch supports for individuals who have flat feet.  PROGNOSIS  If treated properly, then the symptoms of plantar fasciitis usually resolve without surgery. However, occasionally surgery is necessary.  RELATED COMPLICATIONS  Recurrent symptoms that may result in a chronic condition. Problems of the lower back that are caused by  compensating for the injury, such as limping. Pain or weakness of the foot during push-off following surgery. Chronic inflammation, scarring, and partial or complete fascia tear, occurring more often from repeated injections.  TREATMENT  Treatment initially involves the use of ice and medication to help reduce pain and inflammation. The use of strengthening and stretching exercises may help reduce pain with activity, especially stretches of the Achilles tendon. These exercises may be performed at home or with a therapist. Your caregiver may recommend that you use heel cups of arch supports to help reduce stress on the plantar fascia. Occasionally, corticosteroid injections are given to reduce inflammation. If symptoms persist for greater than 6 months despite non-surgical (conservative), then surgery may be recommended.   MEDICATION  If pain medication is necessary, then nonsteroidal anti-inflammatory medications, such as aspirin and ibuprofen, or other minor pain relievers, such as acetaminophen, are often recommended. Do not take pain medication within 7 days before surgery. Prescription pain relievers may be given if deemed necessary by your caregiver. Use only as directed and only as much as you need. Corticosteroid injections may be given by your caregiver. These injections should be reserved for the most serious cases, because they may only be given a certain number of times.  HEAT AND COLD Cold treatment (icing) relieves pain and reduces inflammation. Cold treatment should be applied for 10 to 15 minutes every 2 to 3 hours for inflammation and pain and immediately after any activity that aggravates your symptoms. Use ice packs or massage the area with a piece of ice (ice massage). Heat treatment may be used prior to performing the stretching and strengthening activities prescribed by your  caregiver, physical therapist, or athletic trainer. Use a heat pack or soak the injury in warm  water.  SEEK IMMEDIATE MEDICAL CARE IF: Treatment seems to offer no benefit, or the condition worsens. Any medications produce adverse side effects.  EXERCISES- RANGE OF MOTION (ROM) AND STRETCHING EXERCISES - Plantar Fasciitis (Heel Spur Syndrome) These exercises may help you when beginning to rehabilitate your injury. Your symptoms may resolve with or without further involvement from your physician, physical therapist or athletic trainer. While completing these exercises, remember:  Restoring tissue flexibility helps normal motion to return to the joints. This allows healthier, less painful movement and activity. An effective stretch should be held for at least 30 seconds. A stretch should never be painful. You should only feel a gentle lengthening or release in the stretched tissue.  RANGE OF MOTION - Toe Extension, Flexion Sit with your right / left leg crossed over your opposite knee. Grasp your toes and gently pull them back toward the top of your foot. You should feel a stretch on the bottom of your toes and/or foot. Hold this stretch for 10 seconds. Now, gently pull your toes toward the bottom of your foot. You should feel a stretch on the top of your toes and or foot. Hold this stretch for 10 seconds. Repeat  times. Complete this stretch 3 times per day.   RANGE OF MOTION - Ankle Dorsiflexion, Active Assisted Remove shoes and sit on a chair that is preferably not on a carpeted surface. Place right / left foot under knee. Extend your opposite leg for support. Keeping your heel down, slide your right / left foot back toward the chair until you feel a stretch at your ankle or calf. If you do not feel a stretch, slide your bottom forward to the edge of the chair, while still keeping your heel down. Hold this stretch for 10 seconds. Repeat 3 times. Complete this stretch 2 times per day.   STRETCH  Gastroc, Standing Place hands on wall. Extend right / left leg, keeping the front knee  somewhat bent. Slightly point your toes inward on your back foot. Keeping your right / left heel on the floor and your knee straight, shift your weight toward the wall, not allowing your back to arch. You should feel a gentle stretch in the right / left calf. Hold this position for 10 seconds. Repeat 3 times. Complete this stretch 2 times per day.  STRETCH  Soleus, Standing Place hands on wall. Extend right / left leg, keeping the other knee somewhat bent. Slightly point your toes inward on your back foot. Keep your right / left heel on the floor, bend your back knee, and slightly shift your weight over the back leg so that you feel a gentle stretch deep in your back calf. Hold this position for 10 seconds. Repeat 3 times. Complete this stretch 2 times per day.  STRETCH  Gastrocsoleus, Standing  Note: This exercise can place a lot of stress on your foot and ankle. Please complete this exercise only if specifically instructed by your caregiver.  Place the ball of your right / left foot on a step, keeping your other foot firmly on the same step. Hold on to the wall or a rail for balance. Slowly lift your other foot, allowing your body weight to press your heel down over the edge of the step. You should feel a stretch in your right / left calf. Hold this position for 10 seconds. Repeat this exercise with  a slight bend in your right / left knee. Repeat 3 times. Complete this stretch 2 times per day.   STRENGTHENING EXERCISES - Plantar Fasciitis (Heel Spur Syndrome)  These exercises may help you when beginning to rehabilitate your injury. They may resolve your symptoms with or without further involvement from your physician, physical therapist or athletic trainer. While completing these exercises, remember:  Muscles can gain both the endurance and the strength needed for everyday activities through controlled exercises. Complete these exercises as instructed by your physician, physical  therapist or athletic trainer. Progress the resistance and repetitions only as guided.  STRENGTH - Towel Curls Sit in a chair positioned on a non-carpeted surface. Place your foot on a towel, keeping your heel on the floor. Pull the towel toward your heel by only curling your toes. Keep your heel on the floor. Repeat 3 times. Complete this exercise 2 times per day.  STRENGTH - Ankle Inversion Secure one end of a rubber exercise band/tubing to a fixed object (table, pole). Loop the other end around your foot just before your toes. Place your fists between your knees. This will focus your strengthening at your ankle. Slowly, pull your big toe up and in, making sure the band/tubing is positioned to resist the entire motion. Hold this position for 10 seconds. Have your muscles resist the band/tubing as it slowly pulls your foot back to the starting position. Repeat 3 times. Complete this exercises 2 times per day.  Document Released: 06/02/2005 Document Revised: 08/25/2011 Document Reviewed: 09/14/2008 [facility] Patient Information 2014 [place], [place].

## 2021-12-29 NOTE — Progress Notes (Signed)
Subjective:   Patient ID: Amy Wiley, female   DOB: 51 y.o.   MRN: [id number]   HPI 51 year old female presents the office for concerns of left foot pain.  She gets discomfort the arch of the foot is also noticed this not formed in the arch of the foot also along the Achilles tendon.  This started about a year ago.  She has stiffness and tightness.  She said that she had to stop wearing heels because of the discomfort.  She does have pain when she first gets up or after sitting.  No injuries that she reports.  No numbness or tingling.  No other concerns.   Review of Systems  All other systems reviewed and are negative.  Past Medical History:  Diagnosis Date   Abnormal Pap smear    ADD (attention deficit disorder)    Anxiety     Past Surgical History:  Procedure Laterality Date   WISDOM TOOTH EXTRACTION       Current Outpatient Medications:    HYDROcodone-acetaminophen (NORCO) 10-325 MG tablet, Take 1 tablet by mouth every 8 (eight) hours as needed., Disp: 60 tablet, Rfl: 0   traZODone (DESYREL) 50 MG tablet, Take 1 tablet (50 mg total) by mouth at bedtime as needed for sleep., Disp: 90 tablet, Rfl: 1  Allergies  Allergen Reactions   Varenicline Tartrate     REACTION: NV   Ciprofloxacin Rash    REACTION: RASH          Objective:  Physical Exam  General: AAO x3, NAD  Dermatological: Skin is warm, dry and supple bilateral.  There are no open sores, no preulcerative lesions, no rash or signs of infection present.  Vascular: Dorsalis Pedis artery and Posterior Tibial artery pedal pulses are 2/4 bilateral with immedate capillary fill time.  There is no pain with calf compression, swelling, warmth, erythema.   Neruologic: Grossly intact via light touch bilateral.  Negative Tinel sign.  Musculoskeletal: There is discomfort on the plantar aspect calcaneal insertion of plantar fascia as well as the arch of the foot.  Along the medial band of plantar fascia within the arch  of the foot is a firm mass present consistent with plantar fibroma.  Also small knot is also present along the mid substance of the Achilles tendon.  Clinically the tendons appear to be intact.  No area of point tenderness.  Muscular strength 5/5 in all groups tested bilateral.  Gait: Unassisted, Nonantalgic.       Assessment:   51 year old female with plantar fasciitis, Achilles tendinitis with plantar fibroma likely     Plan:  -Treatment options discussed including all alternatives, risks, and complications -Etiology of symptoms were discussed -X-rays were obtained and reviewed with the patient.  3 views of the left foot were obtained.  No evidence of acute fracture, stress fracture or calcifications. -Discussed getting a night splint. -Discussed traction, icing on a regular basis we discussed shoe modifications good arch support.  Discussed anti-inflammatory medications such as topical Voltaren massage in the area to break up the masses.  Vivi Barrack DPM

## 2022-01-16 DIAGNOSIS — R300 Dysuria: Secondary | ICD-10-CM | POA: Diagnosis not present

## 2022-01-16 DIAGNOSIS — N390 Urinary tract infection, site not specified: Secondary | ICD-10-CM | POA: Diagnosis not present

## 2022-01-22 ENCOUNTER — Encounter: Payer: Self-pay | Admitting: Family Medicine

## 2022-01-22 DIAGNOSIS — M4125 Other idiopathic scoliosis, thoracolumbar region: Secondary | ICD-10-CM

## 2022-01-22 DIAGNOSIS — M545 Low back pain, unspecified: Secondary | ICD-10-CM

## 2022-01-22 MED ORDER — HYDROCODONE-ACETAMINOPHEN 10-325 MG PO TABS: 1.0000 | ORAL_TABLET | Freq: Three times a day (TID) | ORAL | 0 refills | Status: DC | PRN

## 2022-01-22 NOTE — Telephone Encounter (Signed)
Requesting: hydrocodone 10-325mg   Contract:07/30/21 UDS:07/30/21 Last Visit: 07/30/21 Next Visit: None Last Refill: 12/23/21 #60 and 0RF  Please Advise

## 2022-02-21 ENCOUNTER — Encounter: Payer: Self-pay | Admitting: Family Medicine

## 2022-02-21 DIAGNOSIS — M545 Low back pain, unspecified: Secondary | ICD-10-CM

## 2022-02-21 DIAGNOSIS — M4125 Other idiopathic scoliosis, thoracolumbar region: Secondary | ICD-10-CM

## 2022-02-21 NOTE — Telephone Encounter (Signed)
Requesting: hydrocodone 10-325mg   Contract: 07/30/21 UDS: 07/30/21 Last Visit: 07/30/21 Next Visit: None Last Refill: 01/22/22 #60 and 0RF  Please Advise

## 2022-02-23 MED ORDER — HYDROCODONE-ACETAMINOPHEN 10-325 MG PO TABS: 1.0000 | ORAL_TABLET | Freq: Three times a day (TID) | ORAL | 0 refills | Status: DC | PRN

## 2022-03-03 ENCOUNTER — Other Ambulatory Visit: Payer: Self-pay | Admitting: Family Medicine

## 2022-03-03 DIAGNOSIS — G4700 Insomnia, unspecified: Secondary | ICD-10-CM

## 2022-03-24 ENCOUNTER — Other Ambulatory Visit: Payer: Self-pay | Admitting: Family

## 2022-03-24 ENCOUNTER — Encounter: Payer: Self-pay | Admitting: Family Medicine

## 2022-03-24 DIAGNOSIS — M4125 Other idiopathic scoliosis, thoracolumbar region: Secondary | ICD-10-CM

## 2022-03-24 DIAGNOSIS — G8929 Other chronic pain: Secondary | ICD-10-CM

## 2022-03-24 MED ORDER — HYDROCODONE-ACETAMINOPHEN 10-325 MG PO TABS: 1.0000 | ORAL_TABLET | Freq: Three times a day (TID) | ORAL | 0 refills | Status: DC | PRN

## 2022-03-24 NOTE — Telephone Encounter (Signed)
Requesting: hydrocodone 10-325mg   Contract:07/30/21 UDS: 07/30/21 Last Visit: 07/30/21 Next Visit: None Last Refill: 02/23/22 #60 and 0RF   Please Advise

## 2022-04-08 ENCOUNTER — Encounter: Payer: Self-pay | Admitting: Family Medicine

## 2022-04-08 ENCOUNTER — Ambulatory Visit: Payer: BC Managed Care – PPO | Admitting: Family Medicine

## 2022-04-08 VITALS — BP 120/88 | HR 93 | Temp 99.2°F | Resp 16 | Ht 60.0 in | Wt 148.6 lb

## 2022-04-08 DIAGNOSIS — M546 Pain in thoracic spine: Secondary | ICD-10-CM

## 2022-04-08 DIAGNOSIS — M545 Low back pain, unspecified: Secondary | ICD-10-CM

## 2022-04-08 DIAGNOSIS — Z23 Encounter for immunization: Secondary | ICD-10-CM | POA: Diagnosis not present

## 2022-04-08 DIAGNOSIS — F411 Generalized anxiety disorder: Secondary | ICD-10-CM

## 2022-04-08 DIAGNOSIS — G8929 Other chronic pain: Secondary | ICD-10-CM

## 2022-04-08 DIAGNOSIS — G4700 Insomnia, unspecified: Secondary | ICD-10-CM | POA: Diagnosis not present

## 2022-04-08 DIAGNOSIS — M4125 Other idiopathic scoliosis, thoracolumbar region: Secondary | ICD-10-CM | POA: Diagnosis not present

## 2022-04-08 MED ORDER — HYDROCODONE-ACETAMINOPHEN 10-325 MG PO TABS: 1.0000 | ORAL_TABLET | Freq: Three times a day (TID) | ORAL | 0 refills | Status: DC | PRN

## 2022-04-08 MED ORDER — TRAZODONE HCL 50 MG PO TABS: 50.0000 mg | ORAL_TABLET | Freq: Every evening | ORAL | 0 refills | Status: DC | PRN

## 2022-04-08 NOTE — Progress Notes (Signed)
Subjective:   By signing my name below, I, Roma Schanz, attest that this documentation has been prepared under the direction and in the presence of Roma Schanz, 04/08/2022.   Patient ID: Amy Wiley, female    DOB: 06/05/1971, 51 y.o.   MRN: [id number]  Chief Complaint  Patient presents with   Pain   Insomnia   Follow-up    HPI Patient is in today for an office visit.  Refills Patient is requesting refills on 50 mg Trazodone and 325 mg Norco.  Immunizations She is uninterested in receiving an influenza vaccine, but is interested in receiving 1 of 2 shingles vaccines this visit.  Health Maintenance Due  Topic Date Due   COLONOSCOPY (Pts 45-14yrs Insurance coverage will need to be confirmed)  Never done   MAMMOGRAM  09/15/2015   PAP SMEAR-Modifier  09/14/2017   COVID-19 Vaccine (4 - Mixed Product risk series) 12/07/2019    Past Medical History:  Diagnosis Date   Abnormal Pap smear    ADD (attention deficit disorder)    Anxiety     Past Surgical History:  Procedure Laterality Date   WISDOM TOOTH EXTRACTION      Family History  Problem Relation Age of Onset   Heart attack Paternal Grandmother    Heart disease Paternal Grandmother    ALS Paternal Grandmother    Heart attack Paternal Uncle    Heart disease Paternal Uncle 19       MI   Hypertension Father    Heart disease Paternal Grandfather     Social History   Socioeconomic History   Marital status: Married    Spouse name: Not on file   Number of children: Not on file   Years of education: Not on file   Highest education level: Not on file  Occupational History   Occupation: [employer]    Comment: cpa  Tobacco Use   Smoking status: Every Day    Types: Cigarettes   Smokeless tobacco: Never   Tobacco comments:    1-2 cig a day  Substance and Sexual Activity   Alcohol use: No   Drug use: No   Sexual activity: Yes    Partners: Male    Birth control/protection:  I.U.D.    Comment: Mirena   Other Topics Concern   Not on file  Social History Narrative   Exercise-- walks dog qd -- 2 miles   Social Determinants of Health   Financial Resource Strain: Not on file  Food Insecurity: Not on file  Transportation Needs: Not on file  Physical Activity: Not on file  Stress: Not on file  Social Connections: Not on file  Intimate Partner Violence: Not on file    Outpatient Medications Prior to Visit  Medication Sig Dispense Refill   HYDROcodone-acetaminophen (NORCO) 10-325 MG tablet Take 1 tablet by mouth every 8 (eight) hours as needed. 60 tablet 0   traZODone (DESYREL) 50 MG tablet Take 1 tablet (50 mg total) by mouth at bedtime as needed for sleep. 30 tablet 0   No facility-administered medications prior to visit.    Allergies  Allergen Reactions   Varenicline Tartrate     REACTION: NV   Ciprofloxacin Rash    REACTION: RASH    Review of Systems  Constitutional:  Negative for fever and malaise/fatigue.  HENT:  Negative for congestion.   Eyes:  Negative for blurred vision.  Respiratory:  Negative for shortness of breath.   Cardiovascular:  Negative for  chest pain, palpitations and leg swelling.  Gastrointestinal:  Negative for abdominal pain, blood in stool and nausea.  Genitourinary:  Negative for dysuria and frequency.  Musculoskeletal:  Negative for falls.  Skin:  Negative for rash.  Neurological:  Negative for dizziness, loss of consciousness and headaches.  Endo/Heme/Allergies:  Negative for environmental allergies.  Psychiatric/Behavioral:  Negative for depression. The patient is not nervous/anxious.        Objective:    Physical Exam Vitals and nursing note reviewed.  Constitutional:      General: She is not in acute distress.    Appearance: Normal appearance. She is not ill-appearing.  HENT:     Head: Normocephalic and atraumatic.     Right Ear: External ear normal.     Left Ear: External ear normal.  Eyes:      Extraocular Movements: Extraocular movements intact.     Pupils: Pupils are equal, round, and reactive to light.  Cardiovascular:     Rate and Rhythm: Normal rate and regular rhythm.     Heart sounds: Normal heart sounds. No murmur heard.    No gallop.  Pulmonary:     Effort: Pulmonary effort is normal. No respiratory distress.     Breath sounds: Normal breath sounds. No wheezing or rales.  Skin:    General: Skin is warm and dry.  Neurological:     Mental Status: She is alert and oriented to person, place, and time.  Psychiatric:        Judgment: Judgment normal.     BP 120/88 (BP Location: Left Arm, Patient Position: Sitting, Cuff Size: Normal)   Pulse 93   Temp 99.2 F (37.3 C) (Oral)   Resp 16   Ht 5' (1.524 m)   Wt 148 lb 9.6 oz (67.4 kg)   SpO2 98%   BMI 29.02 kg/m  Wt Readings from Last 3 Encounters:  04/08/22 148 lb 9.6 oz (67.4 kg)  07/30/21 151 lb 6.4 oz (68.7 kg)  03/01/21 149 lb 3.2 oz (67.7 kg)       Assessment & Plan:   Problem List Items Addressed This Visit       Unprioritized   Chronic low back pain   Relevant Medications   traZODone (DESYREL) 50 MG tablet   HYDROcodone-acetaminophen (NORCO) 10-325 MG tablet   Idiopathic scoliosis    con't prn pain meds      Relevant Medications   HYDROcodone-acetaminophen (NORCO) 10-325 MG tablet   Chronic back pain    Cont prn pain med      Relevant Medications   traZODone (DESYREL) 50 MG tablet   HYDROcodone-acetaminophen (NORCO) 10-325 MG tablet   Anxiety state    Stable con't trazadone      Relevant Medications   traZODone (DESYREL) 50 MG tablet   Other Visit Diagnoses     Need for shingles vaccine    -  Primary   Relevant Orders   Varicella-zoster vaccine IM (Completed)   Insomnia, unspecified type       Relevant Medications   traZODone (DESYREL) 50 MG tablet      Meds ordered this encounter  Medications   traZODone (DESYREL) 50 MG tablet    Sig: Take 1 tablet (50 mg total) by mouth  at bedtime as needed for sleep.    Dispense:  30 tablet    Refill:  0    Requested drug refills are authorized, however, the patient needs further evaluation and/or laboratory testing before further refills are given. Ask  her to make an appointment for this.   HYDROcodone-acetaminophen (NORCO) 10-325 MG tablet    Sig: Take 1 tablet by mouth every 8 (eight) hours as needed.    Dispense:  60 tablet    Refill:  0    I, Seabron Spates, personally preformed the services described in this documentation.  All medical record entries made by the scribe were at my direction and in my presence.  I have reviewed the chart and discharge instructions (if applicable) and agree that the record reflects my personal performance and is accurate and complete. 04/08/2022.   I,Verona Buck,acting as a Neurosurgeon for [employer], DO.,have documented all relevant documentation on the behalf of Donato Schultz, DO,as directed by  Donato Schultz, DO while in the presence of Donato Schultz, DO.    Donato Schultz, DO

## 2022-04-08 NOTE — Assessment & Plan Note (Signed)
Stable con't trazadone 

## 2022-04-08 NOTE — Patient Instructions (Signed)
Insomnia Insomnia is a sleep disorder that makes it difficult to fall asleep or stay asleep. Insomnia can cause fatigue, low energy, difficulty concentrating, mood swings, and poor performance at work or school. There are three different ways to classify insomnia: Difficulty falling asleep. Difficulty staying asleep. Waking up too early in the morning. Any type of insomnia can be long-term (chronic) or short-term (acute). Both are common. Short-term insomnia usually lasts for 3 months or less. Chronic insomnia occurs at least three times a week for longer than 3 months. What are the causes? Insomnia may be caused by another condition, situation, or substance, such as: Having certain mental health conditions, such as anxiety and depression. Using caffeine, alcohol, tobacco, or drugs. Having gastrointestinal conditions, such as gastroesophageal reflux disease (GERD). Having certain medical conditions. These include: Asthma. Alzheimer's disease. Stroke. Chronic pain. An overactive thyroid gland (hyperthyroidism). Other sleep disorders, such as restless legs syndrome and sleep apnea. Menopause. Sometimes, the cause of insomnia may not be known. What increases the risk? Risk factors for insomnia include: Gender. Females are affected more often than males. Age. Insomnia is more common as people get older. Stress and certain medical and mental health conditions. Lack of exercise. Having an irregular work schedule. This may include working night shifts and traveling between different time zones. What are the signs or symptoms? If you have insomnia, the main symptom is having trouble falling asleep or having trouble staying asleep. This may lead to other symptoms, such as: Feeling tired or having low energy. Feeling nervous about going to sleep. Not feeling rested in the morning. Having trouble concentrating. Feeling irritable, anxious, or depressed. How is this diagnosed? This condition  may be diagnosed based on: Your symptoms and medical history. Your health care provider may ask about: Your sleep habits. Any medical conditions you have. Your mental health. A physical exam. How is this treated? Treatment for insomnia depends on the cause. Treatment may focus on treating an underlying condition that is causing the insomnia. Treatment may also include: Medicines to help you sleep. Counseling or therapy. Lifestyle adjustments to help you sleep better. Follow these instructions at home: Eating and drinking  Limit or avoid alcohol, caffeinated beverages, and products that contain nicotine and tobacco, especially close to bedtime. These can disrupt your sleep. Do not eat a large meal or eat spicy foods right before bedtime. This can lead to digestive discomfort that can make it hard for you to sleep. Sleep habits  Keep a sleep diary to help you and your health care provider figure out what could be causing your insomnia. Write down: When you sleep. When you wake up during the night. How well you sleep and how rested you feel the next day. Any side effects of medicines you are taking. What you eat and drink. Make your bedroom a dark, comfortable place where it is easy to fall asleep. Put up shades or blackout curtains to block light from outside. Use a white noise machine to block noise. Keep the temperature cool. Limit screen use before bedtime. This includes: Not watching TV. Not using your smartphone, tablet, or computer. Stick to a routine that includes going to bed and waking up at the same times every day and night. This can help you fall asleep faster. Consider making a quiet activity, such as reading, part of your nighttime routine. Try to avoid taking naps during the day so that you sleep better at night. Get out of bed if you are still awake after   15 minutes of trying to sleep. Keep the lights down, but try reading or doing a quiet activity. When you feel  sleepy, go back to bed. General instructions Take over-the-counter and prescription medicines only as told by your health care provider. Exercise regularly as told by your health care provider. However, avoid exercising in the hours right before bedtime. Use relaxation techniques to manage stress. Ask your health care provider to suggest some techniques that may work well for you. These may include: Breathing exercises. Routines to release muscle tension. Visualizing peaceful scenes. Make sure that you drive carefully. Do not drive if you feel very sleepy. Keep all follow-up visits. This is important. Contact a health care provider if: You are tired throughout the day. You have trouble in your daily routine due to sleepiness. You continue to have sleep problems, or your sleep problems get worse. Get help right away if: You have thoughts about hurting yourself or someone else. Get help right away if you feel like you may hurt yourself or others, or have thoughts about taking your own life. Go to your nearest emergency room or: Call 911. Call the National Suicide Prevention Lifeline at 1-800-273-8255 or 988. This is open 24 hours a day. Text the Crisis Text Line at 741741. Summary Insomnia is a sleep disorder that makes it difficult to fall asleep or stay asleep. Insomnia can be long-term (chronic) or short-term (acute). Treatment for insomnia depends on the cause. Treatment may focus on treating an underlying condition that is causing the insomnia. Keep a sleep diary to help you and your health care provider figure out what could be causing your insomnia. This information is not intended to replace advice given to you by your health care provider. Make sure you discuss any questions you have with your health care provider. Document Revised: 05/13/2021 Document Reviewed: 05/13/2021 Elsevier Patient Education  2023 [employer]  

## 2022-04-08 NOTE — Assessment & Plan Note (Signed)
Cont prn pain med

## 2022-04-08 NOTE — Assessment & Plan Note (Signed)
con't prn pain meds

## 2022-05-14 ENCOUNTER — Encounter: Payer: Self-pay | Admitting: Family Medicine

## 2022-05-14 DIAGNOSIS — M545 Low back pain, unspecified: Secondary | ICD-10-CM

## 2022-05-14 DIAGNOSIS — M4125 Other idiopathic scoliosis, thoracolumbar region: Secondary | ICD-10-CM

## 2022-05-14 DIAGNOSIS — G4700 Insomnia, unspecified: Secondary | ICD-10-CM

## 2022-05-14 MED ORDER — TRAZODONE HCL 50 MG PO TABS: 50.0000 mg | ORAL_TABLET | Freq: Every evening | ORAL | 2 refills | Status: DC | PRN

## 2022-05-14 NOTE — Telephone Encounter (Signed)
Requesting: NORCO Contract: 07/30/2021 UDS: 07/30/2021 Last OV: 04/08/2022 Next OV: 10/09/2022 Last Refill: 04/08/2022, #60--0 RF Database:   Please advise

## 2022-05-15 MED ORDER — HYDROCODONE-ACETAMINOPHEN 10-325 MG PO TABS: 1.0000 | ORAL_TABLET | Freq: Three times a day (TID) | ORAL | 0 refills | Status: DC | PRN

## 2022-06-11 ENCOUNTER — Encounter: Payer: Self-pay | Admitting: Family Medicine

## 2022-06-11 DIAGNOSIS — G8929 Other chronic pain: Secondary | ICD-10-CM

## 2022-06-11 DIAGNOSIS — M4125 Other idiopathic scoliosis, thoracolumbar region: Secondary | ICD-10-CM

## 2022-06-11 DIAGNOSIS — G4700 Insomnia, unspecified: Secondary | ICD-10-CM

## 2022-06-11 MED ORDER — TRAZODONE HCL 50 MG PO TABS: 50.0000 mg | ORAL_TABLET | Freq: Every evening | ORAL | 2 refills | Status: DC | PRN

## 2022-06-11 MED ORDER — HYDROCODONE-ACETAMINOPHEN 10-325 MG PO TABS: 1.0000 | ORAL_TABLET | Freq: Three times a day (TID) | ORAL | 0 refills | Status: DC | PRN

## 2022-06-11 NOTE — Telephone Encounter (Signed)
Requesting: NORCO Contract: 07/30/2021 UDS: 07/30/2021 Last OV: 04/08/2022 Next OV: 10/09/2022 Last Refill: 05/15/2022, #60--0 RF Database:   Please advise

## 2022-07-06 ENCOUNTER — Encounter: Payer: Self-pay | Admitting: Family Medicine

## 2022-07-06 DIAGNOSIS — M4125 Other idiopathic scoliosis, thoracolumbar region: Secondary | ICD-10-CM

## 2022-07-06 DIAGNOSIS — G8929 Other chronic pain: Secondary | ICD-10-CM

## 2022-07-07 MED ORDER — HYDROCODONE-ACETAMINOPHEN 10-325 MG PO TABS: 1.0000 | ORAL_TABLET | Freq: Three times a day (TID) | ORAL | 0 refills | Status: DC | PRN

## 2022-07-07 NOTE — Telephone Encounter (Signed)
Requesting: NORCO Contract: 07/30/2021 UDS: 07/30/2021 Last OV: 04/08/2022 Next OV: 10/09/2022 Last Refill: 06/11/2022, #60--0 RF Database:   Please advise

## 2022-07-31 ENCOUNTER — Encounter: Payer: Self-pay | Admitting: Family Medicine

## 2022-07-31 DIAGNOSIS — M4125 Other idiopathic scoliosis, thoracolumbar region: Secondary | ICD-10-CM

## 2022-07-31 DIAGNOSIS — M545 Low back pain, unspecified: Secondary | ICD-10-CM

## 2022-07-31 NOTE — Telephone Encounter (Signed)
Requesting: hydrocodone 10-38m Contract: 07/30/21 UDS: 07/30/21 Last Visit: 04/08/22 Next Visit: 10/09/22 Last Refill: 07/07/22 #60 and 0RF   Please Advise

## 2022-08-01 ENCOUNTER — Other Ambulatory Visit: Payer: Self-pay | Admitting: Family Medicine

## 2022-08-01 DIAGNOSIS — G8929 Other chronic pain: Secondary | ICD-10-CM

## 2022-08-01 DIAGNOSIS — M4125 Other idiopathic scoliosis, thoracolumbar region: Secondary | ICD-10-CM

## 2022-08-01 MED ORDER — HYDROCODONE-ACETAMINOPHEN 10-325 MG PO TABS: 1.0000 | ORAL_TABLET | Freq: Three times a day (TID) | ORAL | 0 refills | Status: DC | PRN

## 2022-08-24 ENCOUNTER — Encounter: Payer: Self-pay | Admitting: Family Medicine

## 2022-08-24 DIAGNOSIS — M4125 Other idiopathic scoliosis, thoracolumbar region: Secondary | ICD-10-CM

## 2022-08-24 DIAGNOSIS — G8929 Other chronic pain: Secondary | ICD-10-CM

## 2022-08-25 MED ORDER — HYDROCODONE-ACETAMINOPHEN 10-325 MG PO TABS: 1.0000 | ORAL_TABLET | Freq: Three times a day (TID) | ORAL | 0 refills | Status: DC | PRN

## 2022-08-25 NOTE — Telephone Encounter (Signed)
Requesting: NORCO Contract: 07/30/2021 UDS: 07/30/2021 Last OV: 04/08/2022 Next OV: 10/09/2022 Last Refill: 08/01/2022, #60--0 RF Database:   Please advise

## 2022-09-06 ENCOUNTER — Other Ambulatory Visit: Payer: Self-pay | Admitting: Family Medicine

## 2022-09-06 DIAGNOSIS — G4700 Insomnia, unspecified: Secondary | ICD-10-CM

## 2022-09-19 ENCOUNTER — Encounter: Payer: Self-pay | Admitting: Family Medicine

## 2022-09-19 DIAGNOSIS — M545 Low back pain, unspecified: Secondary | ICD-10-CM

## 2022-09-19 DIAGNOSIS — M4125 Other idiopathic scoliosis, thoracolumbar region: Secondary | ICD-10-CM

## 2022-09-19 MED ORDER — HYDROCODONE-ACETAMINOPHEN 10-325 MG PO TABS: 1.0000 | ORAL_TABLET | Freq: Three times a day (TID) | ORAL | 0 refills | Status: DC | PRN

## 2022-09-19 NOTE — Telephone Encounter (Signed)
MP reviewed: Got 60 tablets 08/25/2022, enough for 20 days, okay to refill.

## 2022-09-19 NOTE — Telephone Encounter (Signed)
Requesting: hydrocodone 10-325mg  Contract: 07/30/21 UDS: 07/30/21 Last Visit: 04/08/22 Next Visit: 10/09/22 Last Refill: 08/25/22 #60 and 0RF  Pt sig: 1 tab q8h prn  Please Advise

## 2022-10-03 DIAGNOSIS — L648 Other androgenic alopecia: Secondary | ICD-10-CM | POA: Diagnosis not present

## 2022-10-09 ENCOUNTER — Encounter: Payer: Self-pay | Admitting: Family Medicine

## 2022-10-09 ENCOUNTER — Ambulatory Visit (INDEPENDENT_AMBULATORY_CARE_PROVIDER_SITE_OTHER): Payer: BC Managed Care – PPO | Admitting: Family Medicine

## 2022-10-09 VITALS — BP 110/70 | HR 76 | Temp 99.0°F | Resp 18 | Ht 60.0 in | Wt 153.0 lb

## 2022-10-09 DIAGNOSIS — M545 Low back pain, unspecified: Secondary | ICD-10-CM | POA: Diagnosis not present

## 2022-10-09 DIAGNOSIS — Z23 Encounter for immunization: Secondary | ICD-10-CM

## 2022-10-09 DIAGNOSIS — Z0000 Encounter for general adult medical examination without abnormal findings: Secondary | ICD-10-CM

## 2022-10-09 DIAGNOSIS — M4125 Other idiopathic scoliosis, thoracolumbar region: Secondary | ICD-10-CM

## 2022-10-09 DIAGNOSIS — G8929 Other chronic pain: Secondary | ICD-10-CM

## 2022-10-09 DIAGNOSIS — Z13220 Encounter for screening for lipoid disorders: Secondary | ICD-10-CM | POA: Diagnosis not present

## 2022-10-09 DIAGNOSIS — Z79899 Other long term (current) drug therapy: Secondary | ICD-10-CM | POA: Diagnosis not present

## 2022-10-09 DIAGNOSIS — F411 Generalized anxiety disorder: Secondary | ICD-10-CM

## 2022-10-09 DIAGNOSIS — R7989 Other specified abnormal findings of blood chemistry: Secondary | ICD-10-CM

## 2022-10-09 DIAGNOSIS — G4700 Insomnia, unspecified: Secondary | ICD-10-CM

## 2022-10-09 MED ORDER — TRAZODONE HCL 50 MG PO TABS: 50.0000 mg | ORAL_TABLET | Freq: Every evening | ORAL | 0 refills | Status: DC | PRN

## 2022-10-09 MED ORDER — HYDROCODONE-ACETAMINOPHEN 10-325 MG PO TABS: 1.0000 | ORAL_TABLET | Freq: Three times a day (TID) | ORAL | 0 refills | Status: DC | PRN

## 2022-10-09 NOTE — Assessment & Plan Note (Signed)
Trazadone  Anxiety stable

## 2022-10-09 NOTE — Progress Notes (Addendum)
Subjective:   By signing my name below, I, [name], attest that this documentation has been prepared under the direction and in the presence of Donato Schultz, DO. 10/09/2022   Patient ID: Amy Wiley, female    DOB: 1971-05-25, 52 y.o.   MRN: [id number]  Chief Complaint  Patient presents with   Annual Exam    Pt states not fasting     HPI Patient is in today for a comprehensive physical exam.   She denies fever, new moles, congestion, sinus pain, sore throat, chest pain, palpitations, cough, shortness of breath, wheezing, nausea, vomiting, abdominal pain, diarrhea, constipation, dysuria, frequency, hematuria, new muscle pain, new joint pain, or headaches at this time.  She has no new changes to family medical history.  She is UTD on dental and vision care.    Past Medical History:  Diagnosis Date   Abnormal Pap smear    ADD (attention deficit disorder)    Anxiety     Past Surgical History:  Procedure Laterality Date   WISDOM TOOTH EXTRACTION      Family History  Problem Relation Age of Onset   Heart attack Paternal Grandmother    Heart disease Paternal Grandmother    ALS Paternal Grandmother    Heart attack Paternal Uncle    Heart disease Paternal Uncle 74       MI   Hypertension Father    Heart disease Paternal Grandfather     Social History   Socioeconomic History   Marital status: Married    Spouse name: Not on file   Number of children: Not on file   Years of education: Not on file   Highest education level: Not on file  Occupational History   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    Comment: Mirena   Other Topics Concern   Not on file  Social  History Narrative   Exercise-- walks dog qd -- 2 miles   Social Determinants of Health   Financial Resource Strain: Not on file  Food Insecurity: Not on file  Transportation Needs: Not on file  Physical Activity: Not on file  Stress: Not on file  Social Connections: Not on file  Intimate Partner Violence: Not on file    Outpatient Medications Prior to Visit  Medication Sig Dispense Refill   HYDROcodone-acetaminophen (NORCO) 10-325 MG tablet Take 1 tablet by mouth every 8 (eight) hours as needed. 60 tablet 0   traZODone (DESYREL) 50 MG tablet Take 1 tablet (50 mg total) by mouth at bedtime as needed for sleep. 90 tablet 0   No facility-administered medications prior to visit.    Allergies  Allergen Reactions   Varenicline Tartrate     REACTION: NV   Ciprofloxacin Rash    REACTION: RASH    Review of Systems  Constitutional:  Negative for fever and malaise/fatigue.  HENT:  Negative for congestion, sinus pain and sore throat.   Eyes:  Negative for blurred vision.  Respiratory:  Negative for cough, shortness of breath and wheezing.   Cardiovascular:  Negative for chest pain, palpitations and leg swelling.  Gastrointestinal:  Negative for abdominal pain, blood in stool, constipation, diarrhea, nausea and vomiting.  Genitourinary:  Negative for dysuria, frequency and hematuria.  Musculoskeletal:  Negative for falls.       (-)new muscle pain (-)new joint pain  Skin:  Negative for rash.       (-)New moles  Neurological:  Negative for dizziness, loss of consciousness and headaches.  Endo/Heme/Allergies:  Negative for environmental allergies.  Psychiatric/Behavioral:  Negative for depression. The patient is not nervous/anxious.        Objective:    Physical Exam Vitals and nursing note reviewed.  Constitutional:      General: She is not in acute distress.    Appearance: Normal appearance. She is well-developed. She is not ill-appearing.  HENT:     Head: Normocephalic and  atraumatic.     Right Ear: Tympanic membrane, ear canal and external ear normal.     Left Ear: Tympanic membrane, ear canal and external ear normal.     Nose: Nose normal.  Eyes:     Extraocular Movements: Extraocular movements intact.     Pupils: Pupils are equal, round, and reactive to light.  Cardiovascular:     Rate and Rhythm: Normal rate and regular rhythm.     Heart sounds: Normal heart sounds. No murmur heard.    No gallop.  Pulmonary:     Effort: Pulmonary effort is normal. No respiratory distress.     Breath sounds: Normal breath sounds. No wheezing or rales.  Chest:     Chest wall: No tenderness.  Abdominal:     General: Bowel sounds are normal. There is no distension.     Palpations: Abdomen is soft.     Tenderness: There is no abdominal tenderness. There is no guarding.  Musculoskeletal:        General: Normal range of motion.     Cervical back: Normal range of motion and neck supple.  Skin:    General: Skin is warm and dry.  Neurological:     General: No focal deficit present.     Mental Status: She is alert and oriented to person, place, and time.  Psychiatric:        Behavior: Behavior normal.        Thought Content: Thought content normal.        Judgment: Judgment normal.     BP 110/70 (BP Location: Left Arm, Patient Position: Sitting, Cuff Size: Normal)   Pulse 76   Temp 99 F (37.2 C) (Oral)   Resp 18   Ht 5' (1.524 m)   Wt 153 lb (69.4 kg)   SpO2 99%   BMI 29.88 kg/m  Wt Readings from Last 3 Encounters:  10/09/22 153 lb (69.4 kg)  04/08/22 148 lb 9.6 oz (67.4 kg)  07/30/21 151 lb 6.4 oz (68.7 kg)       Assessment & Plan:  Preventative health care -     CBC with Differential/Platelet -     Comprehensive metabolic panel -     Lipid panel -     TSH  Chronic low back pain -     HYDROcodone-Acetaminophen; Take 1 tablet by mouth every 8 (eight) hours as needed.  Dispense: 60 tablet; Refill: 0  Other idiopathic scoliosis, thoracolumbar  region Assessment & Plan: Database reviewed  Contract and uds updated  Refill meds   Orders: -     HYDROcodone-Acetaminophen; Take 1 tablet by mouth every 8 (eight) hours as needed.  Dispense: 60 tablet; Refill: 0  Insomnia, unspecified type -     traZODone HCl; Take 1 tablet (50 mg total) by mouth at bedtime as needed for sleep.  Dispense: 90 tablet; Refill: 0  High risk medication use -     Drug Monitoring Panel [phone] , Urine  Abnormal thyroid blood test -     TSH  Need for shingles vaccine -     Varicella-zoster vaccine IM  Anxiety state Assessment & Plan: Trazadone  Anxiety stable    Other orders -     DM TEMPLATE    I, Donato Schultz, DO, personally preformed the services described in this documentation.  All medical record entries made by the scribe were at my direction and in my presence.  I have reviewed the chart and discharge instructions (if applicable) and agree that the record reflects my personal performance and is accurate and complete. 10/09/2022   I,[name],acting as a scribe for Donato Schultz, DO.,have documented all relevant documentation on the behalf of Donato Schultz, DO,as directed by  Donato Schultz, DO while in the presence of Donato Schultz, DO.   Donato Schultz, DO

## 2022-10-09 NOTE — Assessment & Plan Note (Signed)
Database reviewed  Contract and uds updated  Refill meds

## 2022-10-10 LAB — LIPID PANEL
Cholesterol: 211 mg/dL — ABNORMAL HIGH (ref 0–200)
HDL: 54.9 mg/dL (ref >39.00)
LDL Cholesterol: 131 mg/dL — ABNORMAL HIGH (ref 0–99)
NonHDL: 156.35
Total CHOL/HDL Ratio: 4
Triglycerides: 127 mg/dL (ref 0.0–149.0)
VLDL: 25.4 mg/dL (ref 0.0–40.0)

## 2022-10-10 LAB — COMPREHENSIVE METABOLIC PANEL
ALT: 54 U/L — ABNORMAL HIGH (ref 0–35)
AST: 37 U/L (ref 0–37)
Albumin: 4.4 g/dL (ref 3.5–5.2)
Alkaline Phosphatase: 69 U/L (ref 39–117)
BUN: 10 mg/dL (ref 6–23)
CO2: 28 mEq/L (ref 19–32)
Calcium: 9.6 mg/dL (ref 8.4–10.5)
Chloride: 102 mEq/L (ref 96–112)
Creatinine, Ser: 0.95 mg/dL (ref 0.40–1.20)
GFR: 68.86 mL/min (ref >60.00)
Glucose, Bld: 86 mg/dL (ref 70–99)
Potassium: 3.5 mEq/L (ref 3.5–5.1)
Sodium: 139 mEq/L (ref 135–145)
Total Bilirubin: 0.3 mg/dL (ref 0.2–1.2)
Total Protein: 7 g/dL (ref 6.0–8.3)

## 2022-10-10 LAB — CBC WITH DIFFERENTIAL/PLATELET
Basophils Absolute: 0.1 10*3/uL (ref 0.0–0.1)
Basophils Relative: 1 % (ref 0.0–3.0)
Eosinophils Absolute: 0.2 10*3/uL (ref 0.0–0.7)
Eosinophils Relative: 2.3 % (ref 0.0–5.0)
HCT: 37.7 % (ref 36.0–46.0)
Hemoglobin: 12.9 g/dL (ref 12.0–15.0)
Lymphocytes Relative: 27.6 % (ref 12.0–46.0)
Lymphs Abs: 2.6 10*3/uL (ref 0.7–4.0)
MCHC: 34.1 g/dL (ref 30.0–36.0)
MCV: 89 fl (ref 78.0–100.0)
Monocytes Absolute: 0.7 10*3/uL (ref 0.1–1.0)
Monocytes Relative: 7.8 % (ref 3.0–12.0)
Neutro Abs: 5.7 10*3/uL (ref 1.4–7.7)
Neutrophils Relative %: 61.3 % (ref 43.0–77.0)
Platelets: 310 10*3/uL (ref 150.0–400.0)
RBC: 4.24 Mil/uL (ref 3.87–5.11)
RDW: 13.3 % (ref 11.5–15.5)
WBC: 9.3 10*3/uL (ref 4.0–10.5)

## 2022-10-10 LAB — TSH: TSH: 1.13 u[IU]/mL (ref 0.35–5.50)

## 2022-10-11 DIAGNOSIS — Z0000 Encounter for general adult medical examination without abnormal findings: Secondary | ICD-10-CM | POA: Insufficient documentation

## 2022-10-11 LAB — DRUG MONITORING PANEL 376104, URINE
Amphetamines: NEGATIVE ng/mL (ref <500)
Barbiturates: NEGATIVE ng/mL (ref <300)
Benzodiazepines: NEGATIVE ng/mL (ref <100)
Cocaine Metabolite: NEGATIVE ng/mL (ref <150)
Codeine: NEGATIVE ng/mL (ref <50)
Desmethyltramadol: NEGATIVE ng/mL (ref <100)
Hydrocodone: 3089 ng/mL — ABNORMAL HIGH (ref <50)
Hydromorphone: 765 ng/mL — ABNORMAL HIGH (ref <50)
Morphine: NEGATIVE ng/mL (ref <50)
Norhydrocodone: 3490 ng/mL — ABNORMAL HIGH (ref <50)
Opiates: POSITIVE ng/mL — AB (ref <100)
Oxycodone: NEGATIVE ng/mL (ref <100)
Tramadol: NEGATIVE ng/mL (ref <100)

## 2022-10-11 LAB — DM TEMPLATE

## 2022-10-11 NOTE — Assessment & Plan Note (Signed)
Ghm utd Check labs  See [facility] Health Maintenance  Topic Date Due   HIV Screening  Never done   Hepatitis C Screening  Never done   COLONOSCOPY (Pts 45-45yrs Insurance coverage will need to be confirmed)  Never done   MAMMOGRAM  09/15/2015   PAP SMEAR-Modifier  09/14/2017   COVID-19 Vaccine (4 - 2023-24 season) 02/14/2022   Zoster Vaccines- Shingrix (2 of 2) 06/03/2022   INFLUENZA VACCINE  01/15/2023   DTaP/Tdap/Td (2 - Td or Tdap) 02/09/2023   HPV VACCINES  Aged Out

## 2022-11-03 ENCOUNTER — Encounter: Payer: Self-pay | Admitting: Family Medicine

## 2022-11-03 DIAGNOSIS — M4125 Other idiopathic scoliosis, thoracolumbar region: Secondary | ICD-10-CM

## 2022-11-03 DIAGNOSIS — G8929 Other chronic pain: Secondary | ICD-10-CM

## 2022-11-03 MED ORDER — HYDROCODONE-ACETAMINOPHEN 10-325 MG PO TABS
1.0000 | ORAL_TABLET | Freq: Three times a day (TID) | ORAL | 0 refills | Status: DC | PRN
Start: 2022-11-03 — End: 2022-11-24

## 2022-11-03 NOTE — Telephone Encounter (Signed)
Requesting: [facility]CO Contract: 10/09/2022 UDS: 10/09/2022 Last OV: 10/09/2022  Next OV: N/A Last Refill: 10/09/2022, #60--0 RF Database:   Please advise

## 2022-11-24 ENCOUNTER — Encounter: Payer: Self-pay | Admitting: Family Medicine

## 2022-11-24 ENCOUNTER — Other Ambulatory Visit: Payer: Self-pay | Admitting: Family

## 2022-11-24 DIAGNOSIS — M4125 Other idiopathic scoliosis, thoracolumbar region: Secondary | ICD-10-CM

## 2022-11-24 DIAGNOSIS — G8929 Other chronic pain: Secondary | ICD-10-CM

## 2022-11-24 MED ORDER — HYDROCODONE-ACETAMINOPHEN 10-325 MG PO TABS
1.0000 | ORAL_TABLET | Freq: Three times a day (TID) | ORAL | 0 refills | Status: DC | PRN
Start: 2022-11-24 — End: 2022-12-22

## 2022-11-24 NOTE — Telephone Encounter (Signed)
Requesting: hydrocodone 10-325mg   Contract:10/09/22 UDS: 10/09/22 Last Visit: 10/09/22 Next Visit: None Last Refill: 11/03/22 #60 and 0RF   Please Advise

## 2022-12-22 ENCOUNTER — Encounter: Payer: Self-pay | Admitting: Family Medicine

## 2022-12-22 DIAGNOSIS — G8929 Other chronic pain: Secondary | ICD-10-CM

## 2022-12-22 DIAGNOSIS — M4125 Other idiopathic scoliosis, thoracolumbar region: Secondary | ICD-10-CM

## 2022-12-22 MED ORDER — HYDROCODONE-ACETAMINOPHEN 10-325 MG PO TABS
1.0000 | ORAL_TABLET | Freq: Three times a day (TID) | ORAL | 0 refills | Status: DC | PRN
Start: 2022-12-22 — End: 2023-01-12

## 2022-12-22 NOTE — Telephone Encounter (Signed)
Requesting: NORCO Contract: 07/30/21 UDS: 07/30/21 Last OV: 10/09/22 Next OV: N/A Last Refill: 11/24/22, #60--0 RF Database:   Please advise

## 2023-01-12 ENCOUNTER — Encounter: Payer: Self-pay | Admitting: Family Medicine

## 2023-01-12 ENCOUNTER — Other Ambulatory Visit: Payer: Self-pay | Admitting: Family Medicine

## 2023-01-12 DIAGNOSIS — M4125 Other idiopathic scoliosis, thoracolumbar region: Secondary | ICD-10-CM

## 2023-01-12 DIAGNOSIS — M545 Low back pain, unspecified: Secondary | ICD-10-CM

## 2023-01-12 MED ORDER — HYDROCODONE-ACETAMINOPHEN 10-325 MG PO TABS
1.0000 | ORAL_TABLET | Freq: Three times a day (TID) | ORAL | 0 refills | Status: DC | PRN
Start: 2023-01-12 — End: 2023-02-05

## 2023-01-12 NOTE — Telephone Encounter (Signed)
Requesting: hydrocodone 10-325mg   Contract: 10/28/22 UDS:  10/09/22 Last Visit: 10/09/22 Next Visit: None Last Refill: 12/22/22 #60 and 0RF   Please Advise

## 2023-02-04 ENCOUNTER — Encounter: Payer: Self-pay | Admitting: Family Medicine

## 2023-02-04 DIAGNOSIS — M4125 Other idiopathic scoliosis, thoracolumbar region: Secondary | ICD-10-CM

## 2023-02-04 DIAGNOSIS — M545 Low back pain, unspecified: Secondary | ICD-10-CM

## 2023-02-04 NOTE — Telephone Encounter (Signed)
Requesting: hydrocodone 10-325mg   Contract:10/09/22 UDS: 10/09/22 Last Visit: 10/09/22 Next Visit: None Last Refill: 01/12/23 #60 and 0UV   Please Advise

## 2023-02-05 ENCOUNTER — Other Ambulatory Visit: Payer: Self-pay | Admitting: Family Medicine

## 2023-02-05 DIAGNOSIS — G8929 Other chronic pain: Secondary | ICD-10-CM

## 2023-02-05 DIAGNOSIS — M4125 Other idiopathic scoliosis, thoracolumbar region: Secondary | ICD-10-CM

## 2023-02-05 MED ORDER — HYDROCODONE-ACETAMINOPHEN 10-325 MG PO TABS: 1.0000 | ORAL_TABLET | Freq: Three times a day (TID) | ORAL | 0 refills | Status: DC | PRN

## 2023-02-26 ENCOUNTER — Encounter: Payer: Self-pay | Admitting: Family Medicine

## 2023-02-26 ENCOUNTER — Ambulatory Visit: Payer: BC Managed Care – PPO | Admitting: Family Medicine

## 2023-02-26 VITALS — BP 136/90 | HR 81 | Temp 98.9°F | Resp 18 | Ht 60.0 in | Wt 152.2 lb

## 2023-02-26 DIAGNOSIS — G4700 Insomnia, unspecified: Secondary | ICD-10-CM | POA: Diagnosis not present

## 2023-02-26 DIAGNOSIS — M4125 Other idiopathic scoliosis, thoracolumbar region: Secondary | ICD-10-CM | POA: Diagnosis not present

## 2023-02-26 DIAGNOSIS — Z23 Encounter for immunization: Secondary | ICD-10-CM | POA: Diagnosis not present

## 2023-02-26 DIAGNOSIS — Z1211 Encounter for screening for malignant neoplasm of colon: Secondary | ICD-10-CM | POA: Diagnosis not present

## 2023-02-26 MED ORDER — TRAZODONE HCL 50 MG PO TABS
50.0000 mg | ORAL_TABLET | Freq: Every evening | ORAL | 0 refills | Status: DC | PRN
Start: 2023-02-26 — End: 2023-09-21

## 2023-02-26 MED ORDER — HYDROCODONE-ACETAMINOPHEN 10-325 MG PO TABS
1.0000 | ORAL_TABLET | Freq: Three times a day (TID) | ORAL | 0 refills | Status: DC | PRN
Start: 2023-02-26 — End: 2023-03-24

## 2023-02-26 NOTE — Progress Notes (Signed)
Established Patient Office Visit  Subjective   Patient ID: Amy Wiley, female    DOB: 02-09-1971  Age: 52 y.o. MRN: [id number]    HPI Discussed the use of AI scribe software for clinical note transcription with the patient, who gave verbal consent to proceed.  History of Present Illness   The patient presents for medication refills, specifically trazodone and hydrocodone. She has not had any recent changes in her health status. She has not had a flu shot this year and is overdue for a tetanus shot. She has not had a mammogram or Pap smear this year, but she is usually contacted by her gynecologist's office to schedule these. She has not had a colonoscopy, citing concerns about the unpleasantness of the procedure. She has not had recent blood work to check cholesterol levels.       History of Present Illness   The patient presents for medication refills, specifically trazodone and hydrocodone. She has not had any recent changes in her health status. She has not had a flu shot this year and is overdue for a tetanus shot. She has not had a mammogram or Pap smear this year, but she is usually contacted by her gynecologist's office to schedule these. She has not had a colonoscopy, citing concerns about the unpleasantness of the procedure. She has not had recent blood work to check cholesterol levels.      Patient Active Problem List   Diagnosis Date Noted   Preventative health care 10/11/2022   Abnormal thyroid blood test 07/31/2021   Elevated BP without diagnosis of hypertension 03/01/2021   Chronic low back pain 08/02/2018   IUD (intrauterine device) in place 02/12/2012   Chronic back pain 02/11/2011   KNEE PAIN, RIGHT 10/18/2009   FRACTURE, TOE, LEFT 04/25/2009   TOBACCO ABUSE 10/24/2008   PITYRIASIS ROSEA 07/05/2007   FOLLICULITIS 06/23/2007   SINUSITIS, ACUTE NEC 12/30/2006   Anxiety state 11/19/2006   Attention deficit disorder 11/19/2006   FRACTURE, ANKLE, LEFT 11/19/2006    Idiopathic scoliosis 11/19/2006   Past Medical History:  Diagnosis Date   Abnormal Pap smear    ADD (attention deficit disorder)    Anxiety    Past Surgical History:  Procedure Laterality Date   WISDOM TOOTH EXTRACTION     Social History   Tobacco Use   Smoking status: Every Day    Types: Cigarettes   Smokeless tobacco: Never   Tobacco comments:    1-2 cig a day  Substance Use Topics   Alcohol use: No   Drug use: No   Social History   Socioeconomic History   Marital status: Married    Spouse name: Not on file   Number of children: Not on file   Years of education: Not on file   Highest education level: Not on file  Occupational History   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    Comment: Mirena   Other Topics Concern   Not on file  Social History Narrative   Exercise-- walks dog qd -- 2 miles   Social Determinants of Health   Financial Resource Strain: Not  on file  Food Insecurity: Not on file  Transportation Needs: Not on file  Physical Activity: Not on file  Stress: Not on file  Social Connections: Not on file  Intimate Partner Violence: Not on file   Family Status  Relation Name Status   PGM 55 Deceased   Pat Uncle  Deceased   Mother  Alive   Father  Alive   PGF  Deceased  No partnership data on file   Family History  Problem Relation Age of Onset   Heart attack Paternal Grandmother    Heart disease Paternal Grandmother    ALS Paternal Grandmother    Heart attack Paternal Uncle    Heart disease Paternal Uncle 22       MI   Hypertension Father    Heart disease Paternal Grandfather    Allergies  Allergen Reactions   Varenicline Tartrate     REACTION:  NV   Ciprofloxacin Rash    REACTION: RASH      Review of Systems  Constitutional:  Negative for chills, fever and malaise/fatigue.  HENT:  Negative for congestion and hearing loss.   Eyes:  Negative for blurred vision and discharge.  Respiratory:  Negative for cough, sputum production and shortness of breath.   Cardiovascular:  Negative for chest pain, palpitations and leg swelling.  Gastrointestinal:  Negative for abdominal pain, blood in stool, constipation, diarrhea, heartburn, nausea and vomiting.  Genitourinary:  Negative for dysuria, frequency, hematuria and urgency.  Musculoskeletal:  Negative for back pain, falls and myalgias.  Skin:  Negative for rash.  Neurological:  Negative for dizziness, sensory change, loss of consciousness, weakness and headaches.  Endo/Heme/Allergies:  Negative for environmental allergies. Does not bruise/bleed easily.  Psychiatric/Behavioral:  Negative for depression and suicidal ideas. The patient is not nervous/anxious and does not have insomnia.       Objective:     BP (!) 136/90 (BP Location: Left Arm, Patient Position: Sitting, Cuff Size: Normal)   Pulse 81   Temp 98.9 F (37.2 C) (Oral)   Resp 18   Ht 5' (1.524 m)   Wt 152 lb 3.2 oz (69 kg)   SpO2 99%   BMI 29.72 kg/m  BP Readings from Last 3 Encounters:  02/26/23 (!) 136/90  10/09/22 110/70  04/08/22 120/88   Wt Readings from Last 3 Encounters:  02/26/23 152 lb 3.2 oz (69 kg)  10/09/22 153 lb (69.4 kg)  04/08/22 148 lb 9.6 oz (67.4 kg)   SpO2 Readings from Last 3 Encounters:  02/26/23 99%  10/09/22 99%  04/08/22 98%      Physical Exam Vitals and nursing note reviewed.  Constitutional:      General: She is not in acute distress.    Appearance: Normal appearance. She is well-developed.  HENT:     Head: Normocephalic and atraumatic.  Eyes:     General: No scleral icterus.       Right eye: No discharge.        Left eye: No discharge.  Cardiovascular:     Rate and  Rhythm: Normal rate and regular rhythm.     Heart sounds: No murmur heard. Pulmonary:     Effort: Pulmonary effort is normal. No respiratory distress.     Breath sounds: Normal breath sounds.  Musculoskeletal:        General: Normal range of motion.     Cervical back: Normal range of motion and neck supple.     Right lower leg: No edema.  Left lower leg: No edema.  Skin:    General: Skin is warm and dry.  Neurological:     Mental Status: She is alert and oriented to person, place, and time.  Psychiatric:        Mood and Affect: Mood normal.        Behavior: Behavior normal.        Thought Content: Thought content normal.        Judgment: Judgment normal.      No results found for any visits on 02/26/23.  Last CBC Lab Results  Component Value Date   WBC 9.3 10/09/2022   HGB 12.9 10/09/2022   HCT 37.7 10/09/2022   MCV 89.0 10/09/2022   RDW 13.3 10/09/2022   PLT 310.0 10/09/2022   Last metabolic panel Lab Results  Component Value Date   GLUCOSE 86 10/09/2022   NA 139 10/09/2022   K 3.5 10/09/2022   CL 102 10/09/2022   CO2 28 10/09/2022   BUN 10 10/09/2022   CREATININE 0.95 10/09/2022   GFR 68.86 10/09/2022   CALCIUM 9.6 10/09/2022   PROT 7.0 10/09/2022   ALBUMIN 4.4 10/09/2022   BILITOT 0.3 10/09/2022   ALKPHOS 69 10/09/2022   AST 37 10/09/2022   ALT 54 (H) 10/09/2022   Last lipids Lab Results  Component Value Date   CHOL 211 (H) 10/09/2022   HDL 54.90 10/09/2022   LDLCALC 131 (H) 10/09/2022   TRIG 127.0 10/09/2022   CHOLHDL 4 10/09/2022   Last hemoglobin A1c No results found for: "HGBA1C" Last thyroid functions Lab Results  Component Value Date   TSH 1.13 10/09/2022   T4TOTAL 8.7 07/30/2021   Last vitamin D No results found for: "25OHVITD2", "25OHVITD3", "VD25OH" Last vitamin B12 and Folate No results found for: "VITAMINB12", "FOLATE"    The 10-year ASCVD risk score (Arnett DK, et al., 2019) is: 4.8%    Assessment & Plan:   Problem  List Items Addressed This Visit       Unprioritized   Idiopathic scoliosis    Uds and contract utd Con't pain meds       Relevant Medications   HYDROcodone-acetaminophen (NORCO) 10-325 MG tablet   Chronic low back pain    Pt on as needed pain meds       Relevant Medications   HYDROcodone-acetaminophen (NORCO) 10-325 MG tablet   traZODone (DESYREL) 50 MG tablet   Other Visit Diagnoses     Need for Tdap vaccination    -  Primary   Relevant Orders   Tdap vaccine greater than or equal to 7yo IM (Completed)   Insomnia, unspecified type       Relevant Medications   traZODone (DESYREL) 50 MG tablet   Colon cancer screening       Relevant Orders   Ambulatory referral to Gastroenterology     Assessment and Plan    Medication Refills Patient requested refills for Trazodone and Hydrocodone. -Refill Trazodone and Hydrocodone prescriptions.  Tetanus Vaccination Patient's tetanus vaccination is overdue. -Administer tetanus vaccine today.  Colon Cancer Screening Patient has not yet undergone colonoscopy due to perceived unpleasantness of the procedure. Discussed the importance of colonoscopy for early detection and removal of potential cancerous polyps. -Encourage patient to schedule colonoscopy.  General Health Maintenance Patient due for annual gynecological exam and mammogram. Last cholesterol check was performed in this office. -Advise patient to schedule annual gynecological exam and mammogram with her provider at [facility]. -Continue to monitor cholesterol levels  as needed.        Return in about 6 months (around 08/26/2023) for annual exam, fasting.    Donato Schultz, DO

## 2023-02-26 NOTE — Assessment & Plan Note (Signed)
Pt on as needed pain meds

## 2023-02-26 NOTE — Patient Instructions (Signed)
Living With Attention Deficit Hyperactivity Disorder If you have been diagnosed with attention deficit hyperactivity disorder (ADHD), you may be relieved that you now know why you have felt or behaved a certain way. Still, you may feel overwhelmed about the treatment ahead. You may also wonder how to get the support you need and how to deal with the condition day-to-day. With treatment and support, you can live with ADHD and manage your symptoms. How to manage lifestyle changes Managing lifestyle changes can be challenging. Seeking support from your healthcare provider, therapist, family, and friends can be helpful. How to recognize changes in your condition The following signs may mean that your treatment is working well and your condition is improving: Consistently being on time for appointments. Being more organized at home and work. Other people noticing improvements in your behavior. Achieving goals that you set for yourself. Thinking more clearly. The following signs may mean that your treatment is not working very well: Feeling impatience or more confusion. Missing, forgetting, or being late for appointments. An increasing sense of disorganization and messiness. More difficulty in reaching goals that you set for yourself. Loved ones becoming angry or frustrated with you. Follow these instructions at home: Medicines Take over-the-counter and prescription medicines only as told by your health care provider. Check with your health care provider before taking any new medicines. General instructions Create structure and an organized atmosphere at home. For example: Make a list of tasks, then rank them from most important to least important. Work on one task at a time until your listed tasks are done. Make a daily schedule and follow it consistently every day. Use an appointment calendar, and check it 2-3 times a day to keep on track. Keep it with you when you leave the house. Create  spaces where you keep certain things, and always put things back in their places after you use them. Keep all follow-up visits. Your health care provider will need to monitor your condition and adjust your treatment over time. Where to find support Talking to others  Keep emotion out of important discussions and speak in a calm, logical way. Listen closely and patiently to your loved ones. Try to understand their point of view, and try to avoid getting defensive. Take responsibility for the consequences of your actions. Ask that others do not take your behaviors personally. Aim to solve problems as they come up, and express your feelings instead of bottling them up. Talk openly about what you need from your loved ones and how they can support you. Consider going to family therapy sessions or having your family meet with a specialist who deals with ADHD-related behavior problems. Finances Not all insurance plans cover mental health care, so it is important to check with your insurance carrier. If paying for co-pays or counseling services is a problem, search for a local or county mental health care center. Public mental health care services may be offered there at a low cost or no cost when you are not able to see a private health care provider. If you are taking medicine for ADHD, you may be able to get the generic form, which may be less expensive than brand-name medicine. Some makers of prescription medicines also offer help to patients who cannot afford the medicines that they need. Therapy and support groups Talking with a mental health care provider and participating in support groups can help to improve your quality of life, daily functioning, and overall symptoms. Questions to ask your health   care provider: What are the risks and benefits of taking medicines? Would I benefit from therapy? How often should I follow up with a health care provider? Where to find more information Learn more  about ADHD from: Children and Adults with Attention Deficit Hyperactivity Disorder: chadd.org National Institute of Mental Health: nimh.nih.gov Centers for Disease Control and Prevention: cdc.gov Contact a health care provider if: You have side effects from your medicines, such as: Repeated muscle twitches, coughing, or speech outbursts. Sleep problems. Loss of appetite. Dizziness. Unusually fast heartbeat. Stomach pains. Headaches. You have new or worsening behavior problems. You are struggling with anxiety, depression, or substance abuse. Get help right away if: You have a severe reaction to a medicine. These symptoms may be an emergency. Get help right away. Call 911. Do not wait to see if the symptoms will go away. Do not drive yourself to the hospital. Take one of these steps if you feel like you may hurt yourself or others, or have thoughts about taking your own life: Go to your nearest emergency room. Call 911. Call the National Suicide Prevention Lifeline at 1-800-273-8255 or 988. This is open 24 hours a day. Text the Crisis Text Line at 741741. Summary With treatment and support, you can live with ADHD and manage your symptoms. Consider taking part in family therapy or self-help groups with family members or friends. When you talk with friends and family about your ADHD, be patient and communicate openly. Keep all follow-up visits. Your health care provider will need to monitor your condition and adjust your treatment over time. This information is not intended to replace advice given to you by your health care provider. Make sure you discuss any questions you have with your health care provider. Document Revised: 09/20/2021 Document Reviewed: 09/20/2021 Elsevier Patient Education  2024 Elsevier Inc.  

## 2023-02-26 NOTE — Assessment & Plan Note (Signed)
Uds and contract utd Con't pain meds

## 2023-03-24 ENCOUNTER — Other Ambulatory Visit: Payer: Self-pay | Admitting: Family Medicine

## 2023-03-24 ENCOUNTER — Encounter: Payer: Self-pay | Admitting: Family Medicine

## 2023-03-24 DIAGNOSIS — M4125 Other idiopathic scoliosis, thoracolumbar region: Secondary | ICD-10-CM

## 2023-03-24 MED ORDER — HYDROCODONE-ACETAMINOPHEN 10-325 MG PO TABS: 1.0000 | ORAL_TABLET | Freq: Three times a day (TID) | ORAL | 0 refills | Status: DC | PRN

## 2023-03-24 NOTE — Telephone Encounter (Signed)
Requesting: hydrocodone 10-325mg  Contract: 10/09/22 UDS: 10/09/22 Last Visit: 02/26/23 Next Visit: None Last Refill: 02/26/23 #60 and 0RF   Please Advise

## 2023-04-20 ENCOUNTER — Encounter: Payer: Self-pay | Admitting: Family Medicine

## 2023-04-20 DIAGNOSIS — M4125 Other idiopathic scoliosis, thoracolumbar region: Secondary | ICD-10-CM

## 2023-04-20 MED ORDER — HYDROCODONE-ACETAMINOPHEN 10-325 MG PO TABS
1.0000 | ORAL_TABLET | Freq: Three times a day (TID) | ORAL | 0 refills | Status: DC | PRN
Start: 2023-04-20 — End: 2023-05-12

## 2023-04-20 NOTE — Telephone Encounter (Signed)
Requesting: NORCO Contract: 10/09/22 UDS: 10/09/22 Last OV:  02/26/23 Next OV: n/a Last Refill: 03/24/23, #60--0 RF Database:   Please advise

## 2023-05-12 ENCOUNTER — Encounter: Payer: Self-pay | Admitting: Family Medicine

## 2023-05-12 DIAGNOSIS — M4125 Other idiopathic scoliosis, thoracolumbar region: Secondary | ICD-10-CM

## 2023-05-12 MED ORDER — HYDROCODONE-ACETAMINOPHEN 10-325 MG PO TABS: 1.0000 | ORAL_TABLET | Freq: Three times a day (TID) | ORAL | 0 refills | Status: DC | PRN

## 2023-05-12 NOTE — Telephone Encounter (Signed)
Requesting: Hydrocodone 10-325 mg Contract: 10/09/2022 UDS: 10/09/2022 Last Visit: 02/26/2023 Next Visit: none Last Refill: 04/20/2023  Please Advise

## 2023-06-03 ENCOUNTER — Encounter: Payer: Self-pay | Admitting: Family Medicine

## 2023-06-03 DIAGNOSIS — M4125 Other idiopathic scoliosis, thoracolumbar region: Secondary | ICD-10-CM

## 2023-06-03 NOTE — Telephone Encounter (Signed)
Requesting: NORCO Contract: 10/09/22 UDS: 10/09/22 Last OV: 02/26/23 Next OV: n/a Last Refill: 05/12/23, #60--0 RF Database:   Please advise

## 2023-06-04 MED ORDER — HYDROCODONE-ACETAMINOPHEN 10-325 MG PO TABS: 1.0000 | ORAL_TABLET | Freq: Three times a day (TID) | ORAL | 0 refills | Status: DC | PRN

## 2023-06-28 ENCOUNTER — Encounter: Payer: Self-pay | Admitting: Family Medicine

## 2023-06-28 DIAGNOSIS — M4125 Other idiopathic scoliosis, thoracolumbar region: Secondary | ICD-10-CM

## 2023-06-29 NOTE — Telephone Encounter (Signed)
 Requesting: NORCO Contract: 10/09/22 UDS: 10/09/22 Last OV: 02/26/23 Next OV: n/a Last Refill: 06/04/23, #60--0 RF Database:   Please advise

## 2023-07-01 ENCOUNTER — Other Ambulatory Visit: Payer: Self-pay | Admitting: Family Medicine

## 2023-07-01 DIAGNOSIS — M4125 Other idiopathic scoliosis, thoracolumbar region: Secondary | ICD-10-CM

## 2023-07-01 MED ORDER — HYDROCODONE-ACETAMINOPHEN 10-325 MG PO TABS: 1.0000 | ORAL_TABLET | Freq: Three times a day (TID) | ORAL | 0 refills | Status: DC | PRN

## 2023-07-27 ENCOUNTER — Other Ambulatory Visit: Payer: Self-pay | Admitting: Family Medicine

## 2023-07-27 ENCOUNTER — Encounter: Payer: Self-pay | Admitting: Family Medicine

## 2023-07-27 DIAGNOSIS — M4125 Other idiopathic scoliosis, thoracolumbar region: Secondary | ICD-10-CM

## 2023-07-27 MED ORDER — HYDROCODONE-ACETAMINOPHEN 10-325 MG PO TABS: 1.0000 | ORAL_TABLET | Freq: Three times a day (TID) | ORAL | 0 refills | Status: DC | PRN

## 2023-07-27 NOTE — Telephone Encounter (Signed)
 Requesting: hydrocodone  10-325mg   Contract:  10/09/22 UDS: 10/09/22 Last Visit: 02/26/23 Next Visit: None Last Refill: 07/01/23 #60 and 0RF  Please Advise

## 2023-08-21 ENCOUNTER — Encounter: Payer: Self-pay | Admitting: Family Medicine

## 2023-08-21 DIAGNOSIS — M4125 Other idiopathic scoliosis, thoracolumbar region: Secondary | ICD-10-CM

## 2023-08-21 MED ORDER — HYDROCODONE-ACETAMINOPHEN 10-325 MG PO TABS: 1.0000 | ORAL_TABLET | Freq: Three times a day (TID) | ORAL | 0 refills | Status: DC | PRN

## 2023-08-21 NOTE — Telephone Encounter (Signed)
 Requesting: NORCO Contract: 10/09/22 UDS: 10/09/22 Last OV: 02/26/23 Next OV: n/a Last Refill: 07/27/2023, #60--0 RF Database:   Please advise

## 2023-09-21 ENCOUNTER — Encounter: Payer: Self-pay | Admitting: Family Medicine

## 2023-09-21 DIAGNOSIS — M4125 Other idiopathic scoliosis, thoracolumbar region: Secondary | ICD-10-CM

## 2023-09-21 DIAGNOSIS — G4700 Insomnia, unspecified: Secondary | ICD-10-CM

## 2023-09-21 MED ORDER — TRAZODONE HCL 50 MG PO TABS: 50.0000 mg | ORAL_TABLET | Freq: Every evening | ORAL | 1 refills | Status: DC | PRN

## 2023-09-21 NOTE — Telephone Encounter (Signed)
 Requesting: NORCO Contract: 10/09/22 UDS: 10/09/22 Last OV: 02/26/2023 Next OV: n/a Last Refill: 08/21/2023, #60--0 RF Database:   Please advise

## 2023-09-22 ENCOUNTER — Other Ambulatory Visit: Payer: Self-pay | Admitting: Family Medicine

## 2023-09-22 DIAGNOSIS — M4125 Other idiopathic scoliosis, thoracolumbar region: Secondary | ICD-10-CM

## 2023-09-22 MED ORDER — HYDROCODONE-ACETAMINOPHEN 10-325 MG PO TABS: 1.0000 | ORAL_TABLET | Freq: Three times a day (TID) | ORAL | 0 refills | Status: DC | PRN

## 2023-10-20 ENCOUNTER — Ambulatory Visit (INDEPENDENT_AMBULATORY_CARE_PROVIDER_SITE_OTHER): Admitting: Family Medicine

## 2023-10-20 ENCOUNTER — Encounter: Payer: Self-pay | Admitting: Family Medicine

## 2023-10-20 DIAGNOSIS — G4700 Insomnia, unspecified: Secondary | ICD-10-CM | POA: Diagnosis not present

## 2023-10-20 DIAGNOSIS — M4125 Other idiopathic scoliosis, thoracolumbar region: Secondary | ICD-10-CM | POA: Diagnosis not present

## 2023-10-20 MED ORDER — HYDROCODONE-ACETAMINOPHEN 10-325 MG PO TABS: 1.0000 | ORAL_TABLET | Freq: Three times a day (TID) | ORAL | 0 refills | Status: DC | PRN

## 2023-10-20 MED ORDER — TRAZODONE HCL 50 MG PO TABS: 50.0000 mg | ORAL_TABLET | Freq: Every evening | ORAL | 1 refills | Status: DC | PRN

## 2023-10-20 NOTE — Patient Instructions (Signed)
Scoliosis  Scoliosis is a condition in which the spine curves sideways. Normally, the spine does not do this. With scoliosis, the spine may curve to the left, to the right, or in both directions. The curve of the spine is measured by angles in degrees. Scoliosis can affect people at any age, but it is more common among children and adolescents. What are the causes? The cause of scoliosis is not always known. It may be caused by: A birth defect. A disease that can affect muscles or body balance, such as cerebral palsy or muscular dystrophy. What are the signs or symptoms? This condition may not cause any symptoms. If you do have symptoms, they may include: Leaning to one side. Sunken chest and uneven shoulders. One side of the body being different or larger than the other side (asymmetry). An abnormal curve in the back. Pain. This may limit physical activity. Shortness of breath. Bowel or bladder control problems, such as not knowing when you have to go. This can be a sign of nerve damage. How is this diagnosed? This condition is diagnosed based on: Your medical history. Your symptoms. A physical exam. This may include: Examining your nerves, muscles, and reflexes (neurological exam). Testing the movement of your spine (range of motion study). Imaging tests, such as: X-rays. An MRI. How is this treated? Treatment for this condition depends on the severity of your symptoms. Treatment may include: A back brace to prevent scoliosis from getting worse. This may be needed during times of fast growth (growth spurts), such as during adolescence. Physical therapy. This involves movements and exercises to strengthen the back. NSAIDs to help relieve back pain, such as aspirin, ibuprofen, and naproxen. Surgery. Follow these instructions at home: Activity Exercise regularly. Ask your health care provider what activities and exercises are best for you. Keeping your back strong and flexible may  help your symptoms and prevent your condition from getting worse. If physical therapy was prescribed, do exercises as told. Maintain good posture. You may have to avoid lifting. Ask your health care provider how much you can safely lift. Before starting any new sport or physical activity, ask your health care provider if it is safe for you. If you have a removable brace: Wear the brace as told by your health care provider. Remove it only as told by your health care provider. Check the skin around the brace every day. Tell your health care provider about any concerns. Keep the brace clean and dry. General instructions Take over-the-counter and prescription medicines only as told by your health care provider. Keep all follow-up visits. Your health care provider will monitor your condition over time to watch for signs that it is getting worse and change your treatment plan as needed. Where to find more information National Scoliosis Foundation: scoliosis.org Contact a health care provider if: Medicine does not help your back pain. Get help right away if: You lose control of your legs or you cannot walk. You lose control of your bladder or bowels. You develop sudden numbness in the legs. Summary Scoliosis is a condition in which the spine curves sideways. The spine may curve to the left, to the right, or in both directions. This condition may be caused by birth defects or diseases that affect muscles and body balance. Exercise regularly. Ask your health care provider what activities and exercises are best for you. Keeping your back strong and flexible may help your symptoms and prevent your condition from getting worse. This information is not   intended to replace advice given to you by your health care provider. Make sure you discuss any questions you have with your health care provider. Document Revised: 08/15/2021 Document Reviewed: 08/15/2021 Elsevier Patient Education  2024 Elsevier  Inc.  

## 2023-10-20 NOTE — Progress Notes (Signed)
 Established Patient Office Visit  Subjective   Patient ID: Amy Wiley, female    DOB: 04-26-1971  Age: 53 y.o. MRN: [id number]  Chief Complaint  Patient presents with   Medication Refill    HPI Discussed the use of AI scribe software for clinical note transcription with the patient, who gave verbal consent to proceed.  History of Present Illness A 53 year old female presents for a routine three-month follow-up for pain management.  She experienced elevated blood pressure during the visit, which she attributes to consuming ramen noodles for lunch due to her high sodium content. She did not have dinner the previous night, opting instead for a large fruit and yogurt parfait for lunch.  She mentions a change in her insurance due to a new job and requests assistance with a preauthorization form for her medications. She confirms the need for medication refills, which are to be sent to [facility].  She shares that she has a new dog, a rescue from the pound, who is very needy and has difficulty being crated due to past experiences. The dog is a mix of several breeds and attends daycare at [facility] when she is not home.   Patient Active Problem List   Diagnosis Date Noted   Preventative health care 10/11/2022   Abnormal thyroid  blood test 07/31/2021   Elevated BP without diagnosis of hypertension 03/01/2021   Chronic low back pain 08/02/2018   IUD (intrauterine device) in place 02/12/2012   Chronic back pain 02/11/2011   KNEE PAIN, RIGHT 10/18/2009   FRACTURE, TOE, LEFT 04/25/2009   TOBACCO ABUSE 10/24/2008   PITYRIASIS ROSEA 07/05/2007   FOLLICULITIS 06/23/2007   SINUSITIS, ACUTE NEC 12/30/2006   Anxiety state 11/19/2006   Attention deficit disorder 11/19/2006   Closed fracture of ankle 11/19/2006   Idiopathic scoliosis 11/19/2006   Past Medical History:  Diagnosis Date   Abnormal Pap smear    ADD (attention deficit disorder)    Anxiety    Past Surgical History:   Procedure Laterality Date   WISDOM TOOTH EXTRACTION     Social History   Tobacco Use   Smoking status: Every Day    Types: Cigarettes   Smokeless tobacco: Never   Tobacco comments:    1-2 cig a day  Substance Use Topics   Alcohol use: No   Drug use: No   Social History   Socioeconomic History   Marital status: Married    Spouse name: Not on file   Number of children: Not on file   Years of education: Not on file   Highest education level: Not on file  Occupational History   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    Comment: Mirena   Other Topics Concern   Not on file  Social History Narrative   Exercise-- walks dog qd -- 2 miles   Social Drivers of Corporate investment banker Strain: Not on file  Food Insecurity: Not on file  Transportation Needs: Not on file  Physical Activity: Not on file  Stress: Not on file  Social Connections: Not on file  Intimate Partner Violence: Not on  file   Family Status  Relation Name Status   PGM 40 Deceased   Pat Uncle  Deceased   Mother  Alive   Father  Alive   PGF  Deceased  No partnership data on file   Family History  Problem Relation Age of Onset   Heart attack Paternal Grandmother    Heart disease Paternal Grandmother    ALS Paternal Grandmother    Heart attack Paternal Uncle    Heart disease Paternal Uncle 51       MI   Hypertension Father    Heart disease Paternal Grandfather    Allergies  Allergen Reactions   Varenicline Tartrate     REACTION: NV   Ciprofloxacin Rash    REACTION: RASH      Review of Systems  Constitutional:  Negative for chills, fever and malaise/fatigue.  HENT:  Negative for congestion and hearing  loss.   Eyes:  Negative for discharge.  Respiratory:  Negative for cough, sputum production and shortness of breath.   Cardiovascular:  Negative for chest pain, palpitations and leg swelling.  Gastrointestinal:  Negative for abdominal pain, blood in stool, constipation, diarrhea, heartburn, nausea and vomiting.  Genitourinary:  Negative for dysuria, frequency, hematuria and urgency.  Musculoskeletal:  Negative for back pain, falls and myalgias.  Skin:  Negative for rash.  Neurological:  Negative for dizziness, sensory change, loss of consciousness, weakness and headaches.  Endo/Heme/Allergies:  Negative for environmental allergies. Does not bruise/bleed easily.  Psychiatric/Behavioral:  Negative for depression and suicidal ideas. The patient is not nervous/anxious and does not have insomnia.       Objective:     BP (!) 136/100   Pulse 88   Temp 98.3 F (36.8 C)   Resp 18   Ht 5' (1.524 m)   Wt 153 lb 3.2 oz (69.5 kg)   SpO2 98%   BMI 29.92 kg/m    Physical Exam Vitals and nursing note reviewed.  Constitutional:      General: She is not in acute distress.    Appearance: Normal appearance. She is well-developed.  HENT:     Head: Normocephalic and atraumatic.  Eyes:     General: No scleral icterus.       Right eye: No discharge.        Left eye: No discharge.  Cardiovascular:     Rate and Rhythm: Normal rate and regular rhythm.     Heart sounds: No murmur heard. Pulmonary:     Effort: Pulmonary effort is normal. No respiratory distress.     Breath sounds: Normal breath sounds.  Musculoskeletal:        General: Normal range of motion.     Cervical back: Normal range of motion and neck supple.     Right lower leg: No edema.     Left lower leg: No edema.  Skin:    General: Skin is warm and dry.  Neurological:     Mental Status: She is alert and oriented to person, place, and time.  Psychiatric:        Mood and Affect: Mood normal.        Behavior: Behavior normal.         Thought Content: Thought content normal.        Judgment: Judgment normal.      No results found for any visits on 10/20/23.  Last CBC Lab Results  Component Value Date   WBC 9.3 10/09/2022   HGB 12.9 10/09/2022  HCT 37.7 10/09/2022   MCV 89.0 10/09/2022   RDW 13.3 10/09/2022   PLT 310.0 10/09/2022   Last metabolic panel Lab Results  Component Value Date   GLUCOSE 86 10/09/2022   NA 139 10/09/2022   K 3.5 10/09/2022   CL 102 10/09/2022   CO2 28 10/09/2022   BUN 10 10/09/2022   CREATININE 0.95 10/09/2022   GFR 68.86 10/09/2022   CALCIUM 9.6 10/09/2022   PROT 7.0 10/09/2022   ALBUMIN 4.4 10/09/2022   BILITOT 0.3 10/09/2022   ALKPHOS 69 10/09/2022   AST 37 10/09/2022   ALT 54 (H) 10/09/2022   Last lipids Lab Results  Component Value Date   CHOL 211 (H) 10/09/2022   HDL 54.90 10/09/2022   LDLCALC 131 (H) 10/09/2022   TRIG 127.0 10/09/2022   CHOLHDL 4 10/09/2022   Last hemoglobin A1c No results found for: "HGBA1C" Last thyroid  functions Lab Results  Component Value Date   TSH 1.13 10/09/2022   T4TOTAL 8.7 07/30/2021   Last vitamin D No results found for: "25OHVITD2", "25OHVITD3", "VD25OH" Last vitamin B12 and Folate No results found for: "VITAMINB12", "FOLATE"    The 10-year ASCVD risk score (Arnett DK, et al., 2019) is: 5.1%    Assessment & Plan:   Problem List Items Addressed This Visit       Unprioritized   Idiopathic scoliosis   Stable Refill pain meds Database reviewed  Contract and uds utd       Relevant Medications   HYDROcodone -acetaminophen  (NORCO) 10-325 MG tablet   Other Visit Diagnoses       Insomnia, unspecified type       Relevant Medications   traZODone  (DESYREL ) 50 MG tablet     Assessment and Plan Assessment & Plan Hypertension   Blood pressure is elevated, possibly due to high sodium intake from recent ramen noodle consumption. No acute stressors identified. Recheck blood pressure after rest.    Return in  about 6 months (around 04/21/2024), or if symptoms worsen or fail to improve.    Traeton Bordas R Lowne Chase, DO

## 2023-10-20 NOTE — Assessment & Plan Note (Signed)
 Stable Refill pain meds Database reviewed  Contract and uds utd

## 2023-11-04 ENCOUNTER — Ambulatory Visit (INDEPENDENT_AMBULATORY_CARE_PROVIDER_SITE_OTHER): Admitting: *Deleted

## 2023-11-04 VITALS — BP 130/92

## 2023-11-04 DIAGNOSIS — E870 Hyperosmolality and hypernatremia: Secondary | ICD-10-CM | POA: Diagnosis not present

## 2023-11-04 MED ORDER — AMLODIPINE BESYLATE 5 MG PO TABS: 5.0000 mg | ORAL_TABLET | Freq: Every day | ORAL | 0 refills | Status: DC

## 2023-11-04 NOTE — Progress Notes (Signed)
 Pt here for Blood pressure check per verbal order of Dr Gwenette Lennox.  Pt does not currently take any blood pressure medication.   Had elevated BP at last visit with PCP (Dr Crecencio Dodge) on 10/20/23.  BP Readings from Last 3 Encounters:  10/20/23 (!) 136/100  02/26/23 (!) 136/90  10/09/22 110/70   Pt reported raman noodle intake the day prior to her visit in May and was concerned it was diet related.  Here to recheck today.    Pt notes a "40 pound weight gain in last 3 years". 04/20/18 wt = 117 05/30/19 wt = 126 Current weight = 153  BP today @ 8:55am =  134/91 HR =86  Manual repeat = 130/92  Pt advised per Dr Gwenette Lennox to being Amlodipine 5mg  once a day and follow up with PCP in 2-4 weeks.    Follow up scheduled for 11/19/23 @ 8:20am.

## 2023-11-04 NOTE — Patient Instructions (Signed)
 Start taking Amlodipine 5mg  once a day.  Rx sent to your pharmacy. Follow up with Dr Jalene Mayor on 11/19/23 @ 8:20. Let us  know if you have any concerns before that appointment.

## 2023-11-16 ENCOUNTER — Other Ambulatory Visit: Payer: Self-pay | Admitting: Family Medicine

## 2023-11-16 ENCOUNTER — Encounter: Payer: Self-pay | Admitting: Family Medicine

## 2023-11-16 DIAGNOSIS — M4125 Other idiopathic scoliosis, thoracolumbar region: Secondary | ICD-10-CM

## 2023-11-16 MED ORDER — HYDROCODONE-ACETAMINOPHEN 10-325 MG PO TABS
1.0000 | ORAL_TABLET | Freq: Three times a day (TID) | ORAL | 0 refills | Status: DC | PRN
Start: 2023-11-16 — End: 2023-12-15

## 2023-11-16 NOTE — Telephone Encounter (Signed)
 Requesting: hydrocodone  10/325mg  take 1 every 8 hours as needed, #60 Contract: No, Past due 2023, note has been made to compete at upcoming OV in June UDS: 10/09/22 OVERDUE, note has been made to compete at upcoming OV in June Last Visit: 10/20/2023 Next Visit: 11/19/2023 Last Refill: 10/20/23, #60  Please Advise

## 2023-11-19 ENCOUNTER — Encounter: Payer: Self-pay | Admitting: Family Medicine

## 2023-11-19 ENCOUNTER — Ambulatory Visit: Admitting: Family Medicine

## 2023-11-19 VITALS — BP 110/80 | HR 82 | Temp 98.9°F | Resp 18 | Ht 60.0 in | Wt 150.8 lb

## 2023-11-19 DIAGNOSIS — I10 Essential (primary) hypertension: Secondary | ICD-10-CM

## 2023-11-19 DIAGNOSIS — Z1211 Encounter for screening for malignant neoplasm of colon: Secondary | ICD-10-CM | POA: Diagnosis not present

## 2023-11-19 DIAGNOSIS — Z79899 Other long term (current) drug therapy: Secondary | ICD-10-CM

## 2023-11-19 NOTE — Assessment & Plan Note (Signed)
 Well controlled, no changes to meds. Encouraged heart healthy diet such as the DASH diet and exercise as tolerated.

## 2023-11-19 NOTE — Progress Notes (Signed)
 Established Patient Office Visit  Subjective   Patient ID: Amy Wiley, female    DOB: 01-12-1971  Age: 53 y.o. MRN: [id number]  Chief Complaint  Patient presents with   Hypertension   Weight Loss    HPI Discussed the use of AI scribe software for clinical note transcription with the patient, who gave verbal consent to proceed.  History of Present Illness Amy Wiley is a 53 year old female with hypertension who presents for a follow-up on her blood pressure management.  Her blood pressure was elevated during a visit a few weeks ago, prompting this follow-up appointment. She has been adherent to her prescribed blood pressure medication, currently taking a 5 mg dose, and reports no side effects such as swelling in her hands or ankles.  She is concerned about weight gain, noting an increase of approximately 35 pounds over the past three years. Despite dietary modifications, including reducing carbohydrate intake, and increasing physical activity, she has not experienced weight loss. She prefers non-pharmacological interventions for weight management due to a fear of needles.   Patient Active Problem List   Diagnosis Date Noted   Preventative health care 10/11/2022   Abnormal thyroid  blood test 07/31/2021   HTN (hypertension), benign 03/01/2021   Chronic low back pain 08/02/2018   IUD (intrauterine device) in place 02/12/2012   Chronic back pain 02/11/2011   KNEE PAIN, RIGHT 10/18/2009   FRACTURE, TOE, LEFT 04/25/2009   TOBACCO ABUSE 10/24/2008   PITYRIASIS ROSEA 07/05/2007   FOLLICULITIS 06/23/2007   SINUSITIS, ACUTE NEC 12/30/2006   Anxiety state 11/19/2006   Attention deficit disorder 11/19/2006   Closed fracture of ankle 11/19/2006   Idiopathic scoliosis 11/19/2006   Past Medical History:  Diagnosis Date   Abnormal Pap smear    ADD (attention deficit disorder)    Anxiety    Past Surgical History:  Procedure Laterality Date   WISDOM TOOTH EXTRACTION      Social History   Tobacco Use   Smoking status: Every Day    Types: Cigarettes   Smokeless tobacco: Never   Tobacco comments:    1-2 cig a day  Substance Use Topics   Alcohol use: No   Drug use: No   Social History   Socioeconomic History   Marital status: Married    Spouse name: Not on file   Number of children: Not on file   Years of education: Not on file   Highest education level: Not on file  Occupational History   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    Comment: Mirena   Other Topics Concern   Not on file  Social History Narrative   Exercise-- walks dog qd -- 2 miles   Social Drivers of Corporate investment banker Strain: Not on file  Food Insecurity: Not on file  Transportation Needs: Not on file  Physical Activity: Not on file  Stress: Not on file  Social Connections: Not on file  Intimate Partner Violence: Not on file   Family Status  Relation Name Status   PGM 96 Deceased   Ala Alice  Deceased  Mother  Alive   Father  Alive   PGF  Deceased  No partnership data on file   Family History  Problem Relation Age of Onset   Heart attack Paternal Grandmother    Heart disease Paternal Grandmother    ALS Paternal Grandmother    Heart attack Paternal Uncle    Heart disease Paternal Uncle 56       MI   Hypertension Father    Heart disease Paternal Grandfather    Allergies  Allergen Reactions   Varenicline Tartrate     REACTION: NV   Ciprofloxacin Rash    REACTION: RASH      Review of Systems  Constitutional:  Negative for chills, fever and malaise/fatigue.  HENT:  Negative for congestion and hearing loss.   Eyes:  Negative for blurred vision and  discharge.  Respiratory:  Negative for cough, sputum production and shortness of breath.   Cardiovascular:  Negative for chest pain, palpitations and leg swelling.  Gastrointestinal:  Negative for abdominal pain, blood in stool, constipation, diarrhea, heartburn, nausea and vomiting.  Genitourinary:  Negative for dysuria, frequency, hematuria and urgency.  Musculoskeletal:  Negative for back pain, falls and myalgias.  Skin:  Negative for rash.  Neurological:  Negative for dizziness, sensory change, loss of consciousness, weakness and headaches.  Endo/Heme/Allergies:  Negative for environmental allergies. Does not bruise/bleed easily.  Psychiatric/Behavioral:  Negative for depression and suicidal ideas. The patient is not nervous/anxious and does not have insomnia.       Objective:     BP 110/80 (BP Location: Left Arm, Patient Position: Sitting, Cuff Size: Normal)   Pulse 82   Temp 98.9 F (37.2 C) (Oral)   Resp 18   Ht 5' (1.524 m)   Wt 150 lb 12.8 oz (68.4 kg)   SpO2 98%   BMI 29.45 kg/m  BP Readings from Last 3 Encounters:  11/19/23 110/80  11/04/23 (!) 130/92  10/20/23 (!) 136/100   Wt Readings from Last 3 Encounters:  11/19/23 150 lb 12.8 oz (68.4 kg)  10/20/23 153 lb 3.2 oz (69.5 kg)  02/26/23 152 lb 3.2 oz (69 kg)   SpO2 Readings from Last 3 Encounters:  11/19/23 98%  10/20/23 98%  02/26/23 99%      Physical Exam Vitals and nursing note reviewed.  Constitutional:      General: She is not in acute distress.    Appearance: Normal appearance. She is well-developed.  HENT:     Head: Normocephalic and atraumatic.  Eyes:     General: No scleral icterus.       Right eye: No discharge.        Left eye: No discharge.  Cardiovascular:     Rate and Rhythm: Normal rate and regular rhythm.     Heart sounds: No murmur heard. Pulmonary:     Effort: Pulmonary effort is normal. No respiratory distress.     Breath sounds: Normal breath sounds.  Musculoskeletal:         General: Normal range of motion.     Cervical back: Normal range of motion and neck supple.     Right lower leg: No edema.     Left lower leg: No edema.  Skin:    General: Skin is warm and dry.  Neurological:     Mental Status: She is alert and oriented to person, place, and time.  Psychiatric:        Mood and Affect: Mood normal.  Behavior: Behavior normal.        Thought Content: Thought content normal.        Judgment: Judgment normal.      No results found for any visits on 11/19/23.  Last CBC Lab Results  Component Value Date   WBC 9.3 10/09/2022   HGB 12.9 10/09/2022   HCT 37.7 10/09/2022   MCV 89.0 10/09/2022   RDW 13.3 10/09/2022   PLT 310.0 10/09/2022   Last metabolic panel Lab Results  Component Value Date   GLUCOSE 86 10/09/2022   NA 139 10/09/2022   K 3.5 10/09/2022   CL 102 10/09/2022   CO2 28 10/09/2022   BUN 10 10/09/2022   CREATININE 0.95 10/09/2022   GFR 68.86 10/09/2022   CALCIUM 9.6 10/09/2022   PROT 7.0 10/09/2022   ALBUMIN 4.4 10/09/2022   BILITOT 0.3 10/09/2022   ALKPHOS 69 10/09/2022   AST 37 10/09/2022   ALT 54 (H) 10/09/2022   Last lipids Lab Results  Component Value Date   CHOL 211 (H) 10/09/2022   HDL 54.90 10/09/2022   LDLCALC 131 (H) 10/09/2022   TRIG 127.0 10/09/2022   CHOLHDL 4 10/09/2022   Last hemoglobin A1c No results found for: "HGBA1C" Last thyroid  functions Lab Results  Component Value Date   TSH 1.13 10/09/2022   T4TOTAL 8.7 07/30/2021   Last vitamin D No results found for: "25OHVITD2", "25OHVITD3", "VD25OH" Last vitamin B12 and Folate No results found for: "VITAMINB12", "FOLATE"    The 10-year ASCVD risk score (Arnett DK, et al., 2019) is: 4.5%    Assessment & Plan:   Problem List Items Addressed This Visit       Unprioritized   HTN (hypertension), benign   Well controlled, no changes to meds. Encouraged heart healthy diet such as the DASH diet and exercise as tolerated.        Other  Visit Diagnoses       High risk medication use    -  Primary   Relevant Orders   Drug Monitoring Panel [phone] , Urine     Morbid obesity (HCC)       Relevant Orders   Amb Ref to Medical Weight Management     Colon cancer screening       Relevant Orders   Ambulatory referral to Gastroenterology     Assessment and Plan Assessment & Plan Obesity   She has gained approximately 35 pounds over the past 3-5 years despite lifestyle modifications, including reduced carbohydrate intake and increased physical activity. Her BMI is 29.45, nearing the obesity threshold. She is not interested in injectable weight loss medications like Wegovy or Ozempic due to needle aversion. The limited efficacy and temporary nature of non-injectable weight loss medications, which often result in weight regain after discontinuation, were discussed. The Healthy Weight and Wellness Program, a multidisciplinary team approach to weight management, was introduced as an alternative. This program has shown success, with some individuals losing over 100 pounds, although there is a significant waiting list due to its popularity. Refer to the Healthy Weight and Wellness Program at [place]. Advise her to check with insurance or HR regarding coverage for weight loss medications.  Hypertension   Her hypertension is well-controlled with the current medication regimen. Blood pressure is within normal range, and she reports no side effects such as swelling in hands or feet, which is more prevalent at higher doses. Continue current blood pressure medication as prescribed.  General Health Maintenance   She has not had  a colonoscopy and has not seen a gastroenterologist. The importance of routine screenings for colorectal cancer was discussed. Recommend scheduling a colonoscopy. Consider referral to a gastroenterologist for further evaluation.    Return in about 6 months (around 05/20/2024), or if symptoms worsen or fail to improve, for annual  exam, fasting.    Moosa Bueche R Lowne Chase, DO

## 2023-11-19 NOTE — Patient Instructions (Signed)

## 2023-11-22 LAB — DRUG MONITORING PANEL 376104, URINE
Alphahydroxyalprazolam: 460 ng/mL — ABNORMAL HIGH (ref <25)
Alphahydroxymidazolam: NEGATIVE ng/mL (ref <50)
Alphahydroxytriazolam: NEGATIVE ng/mL (ref <50)
Aminoclonazepam: NEGATIVE ng/mL (ref <25)
Amphetamines: NEGATIVE ng/mL (ref <500)
Barbiturates: NEGATIVE ng/mL (ref <300)
Benzodiazepines: POSITIVE ng/mL — AB (ref <100)
Cocaine Metabolite: NEGATIVE ng/mL (ref <150)
Codeine: NEGATIVE ng/mL (ref <50)
Desmethyltramadol: NEGATIVE ng/mL (ref <100)
Hydrocodone: 2271 ng/mL — ABNORMAL HIGH (ref <50)
Hydromorphone: 2120 ng/mL — ABNORMAL HIGH (ref <50)
Hydroxyethylflurazepam: NEGATIVE ng/mL (ref <50)
Lorazepam: NEGATIVE ng/mL (ref <50)
Morphine: NEGATIVE ng/mL (ref <50)
Nordiazepam: NEGATIVE ng/mL (ref <50)
Norhydrocodone: 3084 ng/mL — ABNORMAL HIGH (ref <50)
Opiates: POSITIVE ng/mL — AB (ref <100)
Oxazepam: NEGATIVE ng/mL (ref <50)
Oxycodone: NEGATIVE ng/mL (ref <100)
Temazepam: NEGATIVE ng/mL (ref <50)
Tramadol: NEGATIVE ng/mL (ref <100)

## 2023-11-22 LAB — DM TEMPLATE

## 2023-11-30 ENCOUNTER — Encounter: Payer: Self-pay | Admitting: Family Medicine

## 2023-11-30 ENCOUNTER — Other Ambulatory Visit: Payer: Self-pay | Admitting: Family Medicine

## 2023-11-30 ENCOUNTER — Telehealth: Payer: Self-pay

## 2023-11-30 MED ORDER — ZEPBOUND 2.5 MG/0.5ML ~~LOC~~ SOAJ: 2.5000 mg | SUBCUTANEOUS | 0 refills | Status: DC

## 2023-11-30 MED ORDER — TIRZEPATIDE-WEIGHT MANAGEMENT 2.5 MG/0.5ML ~~LOC~~ SOLN: 2.5000 mg | SUBCUTANEOUS | 0 refills | Status: DC

## 2023-11-30 NOTE — Telephone Encounter (Addendum)
 Pharmacy Patient Advocate Encounter   Received notification from CoverMyMeds that prior authorization for Zepbound 2.5MG /0.5ML pen-injectors  is required/requested.   Insurance verification completed.   The patient is insured through [facility]   Per test claim: PLEASE BE ADVISED Clinical questions have been answered and PA submitted.TO PLAN. PA currently Pending.   EOCID [id number]

## 2023-12-01 ENCOUNTER — Other Ambulatory Visit: Payer: Self-pay | Admitting: Family Medicine

## 2023-12-01 DIAGNOSIS — E870 Hyperosmolality and hypernatremia: Secondary | ICD-10-CM

## 2023-12-02 ENCOUNTER — Other Ambulatory Visit (HOSPITAL_COMMUNITY): Payer: Self-pay

## 2023-12-02 NOTE — Telephone Encounter (Signed)
 Pharmacy Patient Advocate Encounter  Received notification from [employer] that Prior Authorization for ZEPBOUND 2.5 MG/0.5 ML PEN has been APPROVED from 12/01/2023 to 06/01/2024. Ran test claim, Copay is $25.00. This test claim was processed through [facility]- copay amounts may vary at other pharmacies due to pharmacy/plan contracts, or as the patient moves through the different stages of their insurance plan.  SEE TEST BILLING BELOW    PA #/Case ID/Reference #: [id number]

## 2023-12-14 ENCOUNTER — Encounter (INDEPENDENT_AMBULATORY_CARE_PROVIDER_SITE_OTHER): Payer: Self-pay

## 2023-12-15 ENCOUNTER — Encounter: Payer: Self-pay | Admitting: Family Medicine

## 2023-12-15 DIAGNOSIS — M4125 Other idiopathic scoliosis, thoracolumbar region: Secondary | ICD-10-CM

## 2023-12-15 MED ORDER — HYDROCODONE-ACETAMINOPHEN 10-325 MG PO TABS: 1.0000 | ORAL_TABLET | Freq: Three times a day (TID) | ORAL | 0 refills | Status: DC | PRN

## 2023-12-15 NOTE — Telephone Encounter (Signed)
 Requesting: NORO Contract: 11/19/2023 UDS: 11/19/2023 Last OV: 11/19/23 Next OV: n/a Last Refill: 11/16/2023, #60--0 RF Database:   Please advise

## 2023-12-22 ENCOUNTER — Other Ambulatory Visit: Payer: Self-pay | Admitting: Family Medicine

## 2023-12-22 ENCOUNTER — Encounter: Payer: Self-pay | Admitting: Family Medicine

## 2023-12-22 MED ORDER — ZEPBOUND 5 MG/0.5ML ~~LOC~~ SOAJ: 5.0000 mg | SUBCUTANEOUS | 9 refills | Status: DC

## 2024-01-11 ENCOUNTER — Encounter: Payer: Self-pay | Admitting: Family Medicine

## 2024-01-11 DIAGNOSIS — M4125 Other idiopathic scoliosis, thoracolumbar region: Secondary | ICD-10-CM

## 2024-01-11 MED ORDER — HYDROCODONE-ACETAMINOPHEN 10-325 MG PO TABS: 1.0000 | ORAL_TABLET | Freq: Three times a day (TID) | ORAL | 0 refills | Status: DC | PRN

## 2024-01-11 NOTE — Telephone Encounter (Signed)
 Requesting: NORCO Contract:11/19/23 UDS:11/19/23 Last Visit:11/19/23 Next Visit:n/a Last Refill:12/15/23  Please Advise

## 2024-02-08 ENCOUNTER — Encounter: Payer: Self-pay | Admitting: Family Medicine

## 2024-02-08 DIAGNOSIS — M4125 Other idiopathic scoliosis, thoracolumbar region: Secondary | ICD-10-CM

## 2024-02-08 MED ORDER — HYDROCODONE-ACETAMINOPHEN 10-325 MG PO TABS: 1.0000 | ORAL_TABLET | Freq: Three times a day (TID) | ORAL | 0 refills | Status: DC | PRN

## 2024-02-08 NOTE — Telephone Encounter (Signed)
 Requesting: NORCO Contract: 11/19/2023 UDS: 11/19/2023 Last OV: 11/19/2023 Next OV: n/a Last Refill: 01/11/2024, #60--0 RF Database:   Please advise

## 2024-02-26 ENCOUNTER — Other Ambulatory Visit: Payer: Self-pay | Admitting: Family Medicine

## 2024-02-26 DIAGNOSIS — E870 Hyperosmolality and hypernatremia: Secondary | ICD-10-CM

## 2024-03-01 ENCOUNTER — Other Ambulatory Visit: Payer: Self-pay | Admitting: Family Medicine

## 2024-03-01 ENCOUNTER — Encounter: Payer: Self-pay | Admitting: Family Medicine

## 2024-03-01 MED ORDER — TIRZEPATIDE 7.5 MG/0.5ML ~~LOC~~ SOAJ: 7.5000 mg | SUBCUTANEOUS | 2 refills | Status: DC

## 2024-03-02 ENCOUNTER — Other Ambulatory Visit: Payer: Self-pay | Admitting: Family Medicine

## 2024-03-02 MED ORDER — ZEPBOUND 7.5 MG/0.5ML ~~LOC~~ SOAJ: 7.5000 mg | SUBCUTANEOUS | 2 refills | Status: DC

## 2024-03-07 ENCOUNTER — Encounter: Payer: Self-pay | Admitting: Family Medicine

## 2024-03-07 ENCOUNTER — Other Ambulatory Visit: Payer: Self-pay | Admitting: Family Medicine

## 2024-03-07 DIAGNOSIS — M4125 Other idiopathic scoliosis, thoracolumbar region: Secondary | ICD-10-CM

## 2024-03-07 MED ORDER — HYDROCODONE-ACETAMINOPHEN 10-325 MG PO TABS: 1.0000 | ORAL_TABLET | Freq: Three times a day (TID) | ORAL | 0 refills | Status: DC | PRN

## 2024-03-07 NOTE — Telephone Encounter (Signed)
 Requesting: hydrocodone  10-325mg   Contract: 11/19/23 UDS: 11/19/23 Last Visit: 11/19/23 Next Visit: None Last Refill: 02/08/24 #60 and 0RF   Please Advise

## 2024-03-18 ENCOUNTER — Other Ambulatory Visit: Payer: Self-pay | Admitting: Family Medicine

## 2024-03-18 DIAGNOSIS — G4700 Insomnia, unspecified: Secondary | ICD-10-CM

## 2024-04-05 ENCOUNTER — Encounter: Payer: Self-pay | Admitting: Family Medicine

## 2024-04-05 DIAGNOSIS — M4125 Other idiopathic scoliosis, thoracolumbar region: Secondary | ICD-10-CM

## 2024-04-05 MED ORDER — HYDROCODONE-ACETAMINOPHEN 10-325 MG PO TABS
1.0000 | ORAL_TABLET | Freq: Three times a day (TID) | ORAL | 0 refills | Status: DC | PRN
Start: 2024-04-05 — End: 2024-05-03

## 2024-04-05 NOTE — Telephone Encounter (Signed)
 Requesting: NORCO Contract: 11/19/2023 UDS: 11/19/2023 Last OV: 11/19/2023 Next OV: n/a Last Refill: 03/07/2024, #60--0 RF Database:   Please advise

## 2024-05-03 ENCOUNTER — Other Ambulatory Visit: Payer: Self-pay | Admitting: Family Medicine

## 2024-05-03 ENCOUNTER — Encounter: Payer: Self-pay | Admitting: Family Medicine

## 2024-05-03 DIAGNOSIS — M4125 Other idiopathic scoliosis, thoracolumbar region: Secondary | ICD-10-CM

## 2024-05-03 MED ORDER — ZEPBOUND 10 MG/0.5ML ~~LOC~~ SOAJ: 10.0000 mg | SUBCUTANEOUS | 1 refills | Status: DC

## 2024-05-03 MED ORDER — HYDROCODONE-ACETAMINOPHEN 10-325 MG PO TABS: 1.0000 | ORAL_TABLET | Freq: Three times a day (TID) | ORAL | 0 refills | Status: DC | PRN

## 2024-05-03 NOTE — Telephone Encounter (Signed)
 Requesting: hydrocodone  10-325mg  Contract: 11/19/23 UDS: 11/19/23 Last Visit: 11/19/23 Next Visit: None Last Refill: 04/05/24 #60 and 0RF   Please Advise   Pt also requesting to increase Zepbound  dose.

## 2024-05-24 ENCOUNTER — Ambulatory Visit: Admitting: Family Medicine

## 2024-05-24 VITALS — BP 133/76 | HR 95 | Temp 98.2°F | Resp 12 | Ht 60.0 in | Wt 127.2 lb

## 2024-05-24 DIAGNOSIS — G4700 Insomnia, unspecified: Secondary | ICD-10-CM

## 2024-05-24 DIAGNOSIS — Z79899 Other long term (current) drug therapy: Secondary | ICD-10-CM

## 2024-05-24 DIAGNOSIS — E870 Hyperosmolality and hypernatremia: Secondary | ICD-10-CM

## 2024-05-24 DIAGNOSIS — M4125 Other idiopathic scoliosis, thoracolumbar region: Secondary | ICD-10-CM

## 2024-05-24 MED ORDER — TRAZODONE HCL 50 MG PO TABS: 50.0000 mg | ORAL_TABLET | Freq: Every evening | ORAL | 1 refills | Status: AC | PRN

## 2024-05-24 MED ORDER — AMLODIPINE BESYLATE 5 MG PO TABS: 5.0000 mg | ORAL_TABLET | Freq: Every day | ORAL | 0 refills | Status: AC

## 2024-05-24 MED ORDER — HYDROCODONE-ACETAMINOPHEN 10-325 MG PO TABS: 1.0000 | ORAL_TABLET | Freq: Three times a day (TID) | ORAL | 0 refills | Status: DC | PRN

## 2024-05-24 NOTE — Progress Notes (Unsigned)
 Subjective:    Patient ID: Amy Wiley, female    DOB: [birth year], 53 y.o.   MRN: [id number]  Chief Complaint  Patient presents with   Follow-up   Medication Refill    HPI Patient is in today for f/u.  Discussed the use of AI scribe software for clinical note transcription with the patient, who gave verbal consent to proceed.  History of Present Illness     Past Medical History:  Diagnosis Date   Abnormal Pap smear    ADD (attention deficit disorder)    Anxiety     Past Surgical History:  Procedure Laterality Date   WISDOM TOOTH EXTRACTION      Family History  Problem Relation Age of Onset   Heart attack Paternal Grandmother    Heart disease Paternal Grandmother    ALS Paternal Grandmother    Heart attack Paternal Uncle    Heart disease Paternal Uncle 24       MI   Hypertension Father    Heart disease Paternal Grandfather     Social History   Socioeconomic History   Marital status: Married    Spouse name: Not on file   Number of children: Not on file   Years of education: Not on file   Highest education level: Not on file  Occupational History   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    Comment: Mirena   Other Topics Concern   Not on file  Social History Narrative   Exercise-- walks dog qd -- 2 miles   Social Drivers of Corporate Investment Banker Strain: Not on file  Food Insecurity: Not on file  Transportation Needs: Not on file  Physical Activity: Not on file  Stress: Not on file  Social Connections: Not on file  Intimate Partner Violence: Not on file    Outpatient Medications Prior to Visit  Medication Sig Dispense Refill   tirzepatide  (ZEPBOUND )  7.5 MG/0.5ML Pen Inject 7.5 mg into the skin once a week. 2 mL 2   amLODipine  (NORVASC ) 5 MG tablet Take 1 tablet (5 mg total) by mouth daily. 90 tablet 0   HYDROcodone -acetaminophen  (NORCO) 10-325 MG tablet Take 1 tablet by mouth every 8 (eight) hours as needed (pt needs appointment). 60 tablet 0   traZODone  (DESYREL ) 50 MG tablet Take 1 tablet (50 mg total) by mouth at bedtime as needed for sleep. 90 tablet 1   tirzepatide  (ZEPBOUND ) 10 MG/0.5ML Pen Inject 10 mg into the skin once a week. (Patient not taking: Reported on 05/24/2024) 2 mL 1   tirzepatide  (MOUNJARO ) 7.5 MG/0.5ML Pen Inject 7.5 mg into the skin once a week. 6 mL 2   No facility-administered medications prior to visit.    Allergies  Allergen Reactions   Varenicline Tartrate     REACTION: NV   Ciprofloxacin Rash    REACTION: RASH    ROS     Objective:    Physical Exam  BP 133/76 (BP Location: Right Arm, Patient Position: Sitting, Cuff Size: Normal)   Pulse 95   Temp 98.2 F (36.8 C) (Oral)   Resp 12  Ht 5' (1.524 m)   Wt 127 lb 3.2 oz (57.7 kg)   SpO2 98%   BMI 24.84 kg/m  Wt Readings from Last 3 Encounters:  05/24/24 127 lb 3.2 oz (57.7 kg)  11/19/23 150 lb 12.8 oz (68.4 kg)  10/20/23 153 lb 3.2 oz (69.5 kg)    Diabetic Foot Exam - Simple   No data filed    Lab Results  Component Value Date   WBC 9.3 10/09/2022   HGB 12.9 10/09/2022   HCT 37.7 10/09/2022   PLT 310.0 10/09/2022   GLUCOSE 86 10/09/2022   CHOL 211 (H) 10/09/2022   TRIG 127.0 10/09/2022   HDL 54.90 10/09/2022   LDLCALC 131 (H) 10/09/2022   ALT 54 (H) 10/09/2022   AST 37 10/09/2022   NA 139 10/09/2022   K 3.5 10/09/2022   CL 102 10/09/2022   CREATININE 0.95 10/09/2022   BUN 10 10/09/2022   CO2 28 10/09/2022   TSH 1.13 10/09/2022    Lab Results  Component Value Date   TSH 1.13 10/09/2022   Lab Results  Component Value Date   WBC 9.3 10/09/2022   HGB 12.9 10/09/2022   HCT 37.7 10/09/2022   MCV 89.0 10/09/2022   PLT  310.0 10/09/2022   Lab Results  Component Value Date   NA 139 10/09/2022   K 3.5 10/09/2022   CO2 28 10/09/2022   GLUCOSE 86 10/09/2022   BUN 10 10/09/2022   CREATININE 0.95 10/09/2022   BILITOT 0.3 10/09/2022   ALKPHOS 69 10/09/2022   AST 37 10/09/2022   ALT 54 (H) 10/09/2022   PROT 7.0 10/09/2022   ALBUMIN 4.4 10/09/2022   CALCIUM 9.6 10/09/2022   GFR 68.86 10/09/2022   Lab Results  Component Value Date   CHOL 211 (H) 10/09/2022   Lab Results  Component Value Date   HDL 54.90 10/09/2022   Lab Results  Component Value Date   LDLCALC 131 (H) 10/09/2022   Lab Results  Component Value Date   TRIG 127.0 10/09/2022   Lab Results  Component Value Date   CHOLHDL 4 10/09/2022   No results found for: HGBA1C     Assessment & Plan:  High risk medication use -     DRUG MONITORING, PANEL 8 WITH CONFIRMATION, URINE -     PRESCRIBED DRUGS,MEDMATCH(R)  Essential hypernatremia -     DRUG MONITORING, PANEL 8 WITH CONFIRMATION, URINE -     PRESCRIBED DRUGS,MEDMATCH(R) -     amLODIPine  Besylate; Take 1 tablet (5 mg total) by mouth daily.  Dispense: 90 tablet; Refill: 0 -     CBC with Differential/Platelet -     Comprehensive metabolic panel with GFR -     Lipid panel -     TSH  Other idiopathic scoliosis, thoracolumbar region -     HYDROcodone -Acetaminophen ; Take 1 tablet by mouth every 8 (eight) hours as needed (pt needs appointment).  Dispense: 60 tablet; Refill: 0  Insomnia, unspecified type -     traZODone  HCl; Take 1 tablet (50 mg total) by mouth at bedtime as needed for sleep.  Dispense: 90 tablet; Refill: 1    Jamee JONELLE Antonio Cyndee, DO

## 2024-05-25 ENCOUNTER — Encounter: Payer: Self-pay | Admitting: Family Medicine

## 2024-05-25 LAB — CBC WITH DIFFERENTIAL/PLATELET
Basophils Absolute: 0.1 K/uL (ref 0.0–0.1)
Basophils Relative: 0.6 % (ref 0.0–3.0)
Eosinophils Absolute: 0.1 K/uL (ref 0.0–0.7)
Eosinophils Relative: 0.7 % (ref 0.0–5.0)
HCT: 37.2 % (ref 36.0–46.0)
Hemoglobin: 12.3 g/dL (ref 12.0–15.0)
Lymphocytes Relative: 19.3 % (ref 12.0–46.0)
Lymphs Abs: 1.7 K/uL (ref 0.7–4.0)
MCHC: 33 g/dL (ref 30.0–36.0)
MCV: 84.9 fl (ref 78.0–100.0)
Monocytes Absolute: 0.7 K/uL (ref 0.1–1.0)
Monocytes Relative: 8.2 % (ref 3.0–12.0)
Neutro Abs: 6.4 K/uL (ref 1.4–7.7)
Neutrophils Relative %: 71.2 % (ref 43.0–77.0)
Platelets: 359 K/uL (ref 150.0–400.0)
RBC: 4.39 Mil/uL (ref 3.87–5.11)
RDW: 13.6 % (ref 11.5–15.5)
WBC: 9 K/uL (ref 4.0–10.5)

## 2024-05-25 LAB — COMPREHENSIVE METABOLIC PANEL WITH GFR
ALT: 25 U/L (ref 0–35)
AST: 18 U/L (ref 0–37)
Albumin: 4.9 g/dL (ref 3.5–5.2)
Alkaline Phosphatase: 66 U/L (ref 39–117)
BUN: 7 mg/dL (ref 6–23)
CO2: 29 meq/L (ref 19–32)
Calcium: 9.7 mg/dL (ref 8.4–10.5)
Chloride: 101 meq/L (ref 96–112)
Creatinine, Ser: 0.89 mg/dL (ref 0.40–1.20)
GFR: 73.63 mL/min (ref >60.00)
Glucose, Bld: 93 mg/dL (ref 70–99)
Potassium: 3.7 meq/L (ref 3.5–5.1)
Sodium: 139 meq/L (ref 135–145)
Total Bilirubin: 0.3 mg/dL (ref 0.2–1.2)
Total Protein: 7.5 g/dL (ref 6.0–8.3)

## 2024-05-25 LAB — LIPID PANEL
Cholesterol: 205 mg/dL — ABNORMAL HIGH (ref 0–200)
HDL: 46.6 mg/dL (ref >39.00)
LDL Cholesterol: 136 mg/dL — ABNORMAL HIGH (ref 0–99)
NonHDL: 158.32
Total CHOL/HDL Ratio: 4
Triglycerides: 114 mg/dL (ref 0.0–149.0)
VLDL: 22.8 mg/dL (ref 0.0–40.0)

## 2024-05-25 LAB — TSH: TSH: 0.3 u[IU]/mL — ABNORMAL LOW (ref 0.35–5.50)

## 2024-05-26 ENCOUNTER — Ambulatory Visit: Payer: Self-pay | Admitting: Family Medicine

## 2024-05-26 DIAGNOSIS — E0590 Thyrotoxicosis, unspecified without thyrotoxic crisis or storm: Secondary | ICD-10-CM

## 2024-05-26 LAB — DRUG MONITORING, PANEL 8 WITH CONFIRMATION, URINE
6 Acetylmorphine: NEGATIVE ng/mL (ref <10)
Alcohol Metabolites: NEGATIVE ng/mL (ref <500)
Amphetamines: NEGATIVE ng/mL (ref <500)
Benzodiazepines: NEGATIVE ng/mL (ref <100)
Buprenorphine, Urine: NEGATIVE ng/mL (ref <5)
Cocaine Metabolite: NEGATIVE ng/mL (ref <150)
Codeine: NEGATIVE ng/mL (ref <50)
Creatinine: 152 mg/dL (ref >=20.0)
Hydrocodone: 902 ng/mL — ABNORMAL HIGH (ref <50)
Hydromorphone: 258 ng/mL — ABNORMAL HIGH (ref <50)
MDMA: NEGATIVE ng/mL (ref <500)
Marijuana Metabolite: NEGATIVE ng/mL (ref <20)
Morphine: NEGATIVE ng/mL (ref <50)
Norhydrocodone: 1393 ng/mL — ABNORMAL HIGH (ref <50)
Opiates: POSITIVE ng/mL — AB (ref <100)
Oxidant: NEGATIVE ug/mL (ref <200)
Oxycodone: NEGATIVE ng/mL (ref <100)
pH: 6.1 (ref 4.5–9.0)

## 2024-05-26 LAB — DM TEMPLATE

## 2024-05-26 LAB — PRESCRIBED DRUGS,MEDMATCH(R)

## 2024-05-31 ENCOUNTER — Ambulatory Visit: Admitting: Family Medicine

## 2024-06-07 ENCOUNTER — Other Ambulatory Visit (INDEPENDENT_AMBULATORY_CARE_PROVIDER_SITE_OTHER)

## 2024-06-07 DIAGNOSIS — E0590 Thyrotoxicosis, unspecified without thyrotoxic crisis or storm: Secondary | ICD-10-CM | POA: Diagnosis not present

## 2024-06-08 LAB — THYROID PANEL WITH TSH
Free Thyroxine Index: 2.4 (ref 1.4–3.8)
T3 Uptake: 29 % (ref 22–35)
T4, Total: 8.4 ug/dL (ref 5.1–11.9)
TSH: 0.86 m[IU]/L

## 2024-06-12 ENCOUNTER — Ambulatory Visit: Payer: Self-pay | Admitting: Family

## 2024-06-20 ENCOUNTER — Encounter: Payer: Self-pay | Admitting: Family Medicine

## 2024-06-20 DIAGNOSIS — M4125 Other idiopathic scoliosis, thoracolumbar region: Secondary | ICD-10-CM

## 2024-06-20 MED ORDER — HYDROCODONE-ACETAMINOPHEN 10-325 MG PO TABS: 1.0000 | ORAL_TABLET | Freq: Three times a day (TID) | ORAL | 0 refills | Status: DC | PRN

## 2024-06-20 NOTE — Telephone Encounter (Signed)
 Requesting: NORCO Contract: 05/24/2024 UDS: 05/24/2024 Last OV: 05/24/2024 Next OV: n/a Last Refill: 05/24/2024, #[id number] RF Database:   Please advise

## 2024-07-18 ENCOUNTER — Other Ambulatory Visit: Payer: Self-pay | Admitting: Family

## 2024-07-18 ENCOUNTER — Encounter: Payer: Self-pay | Admitting: Family Medicine

## 2024-07-18 DIAGNOSIS — M4125 Other idiopathic scoliosis, thoracolumbar region: Secondary | ICD-10-CM

## 2024-07-18 MED ORDER — ZEPBOUND 10 MG/0.5ML ~~LOC~~ SOAJ: 10.0000 mg | SUBCUTANEOUS | 1 refills | Status: AC

## 2024-07-18 MED ORDER — HYDROCODONE-ACETAMINOPHEN 10-325 MG PO TABS: 1.0000 | ORAL_TABLET | Freq: Three times a day (TID) | ORAL | 0 refills | Status: AC | PRN
